# Patient Record
Sex: Female | Born: 1960 | Race: White | Hispanic: No | Marital: Married | Smoking: Current every day smoker
Health system: Southern US, Community
[De-identification: ages and names within clinical notes are randomized; demographics above are authoritative.]

## PROBLEM LIST (undated history)

## (undated) DIAGNOSIS — M1990 Unspecified osteoarthritis, unspecified site: Secondary | ICD-10-CM

## (undated) DIAGNOSIS — J45909 Unspecified asthma, uncomplicated: Secondary | ICD-10-CM

## (undated) DIAGNOSIS — J40 Bronchitis, not specified as acute or chronic: Secondary | ICD-10-CM

## (undated) DIAGNOSIS — K219 Gastro-esophageal reflux disease without esophagitis: Secondary | ICD-10-CM

## (undated) DIAGNOSIS — T7840XA Allergy, unspecified, initial encounter: Secondary | ICD-10-CM

## (undated) DIAGNOSIS — R51 Headache: Secondary | ICD-10-CM

## (undated) DIAGNOSIS — J189 Pneumonia, unspecified organism: Secondary | ICD-10-CM

## (undated) DIAGNOSIS — G8929 Other chronic pain: Secondary | ICD-10-CM

## (undated) DIAGNOSIS — F419 Anxiety disorder, unspecified: Secondary | ICD-10-CM

## (undated) DIAGNOSIS — M542 Cervicalgia: Secondary | ICD-10-CM

## (undated) DIAGNOSIS — I10 Essential (primary) hypertension: Secondary | ICD-10-CM

## (undated) HISTORY — PX: SPINE SURGERY: SHX786

## (undated) HISTORY — DX: Unspecified asthma, uncomplicated: J45.909

## (undated) HISTORY — PX: TUBAL LIGATION: SHX77

## (undated) HISTORY — PX: ABDOMINAL HYSTERECTOMY: SHX81

## (undated) HISTORY — PX: TOTAL ABDOMINAL HYSTERECTOMY: SHX209

## (undated) HISTORY — PX: DIAGNOSTIC LAPAROSCOPY: SUR761

## (undated) HISTORY — DX: Allergy, unspecified, initial encounter: T78.40XA

## (undated) HISTORY — PX: CARDIOVASCULAR STRESS TEST: SHX262

---

## 2005-08-28 ENCOUNTER — Ambulatory Visit: Payer: Self-pay | Admitting: General Practice

## 2006-07-16 ENCOUNTER — Observation Stay (HOSPITAL_COMMUNITY): Admission: EM | Admit: 2006-07-16 | Discharge: 2006-07-17 | Payer: Self-pay | Admitting: Emergency Medicine

## 2006-07-17 ENCOUNTER — Ambulatory Visit: Payer: Self-pay | Admitting: *Deleted

## 2008-02-11 ENCOUNTER — Other Ambulatory Visit: Admission: RE | Admit: 2008-02-11 | Discharge: 2008-02-11 | Payer: Self-pay | Admitting: Family Medicine

## 2008-02-15 ENCOUNTER — Ambulatory Visit (HOSPITAL_COMMUNITY): Admission: RE | Admit: 2008-02-15 | Discharge: 2008-02-15 | Payer: Self-pay | Admitting: Family Medicine

## 2009-07-31 ENCOUNTER — Other Ambulatory Visit: Admission: RE | Admit: 2009-07-31 | Discharge: 2009-07-31 | Payer: Self-pay | Admitting: Family Medicine

## 2009-08-02 ENCOUNTER — Emergency Department (HOSPITAL_COMMUNITY): Admission: EM | Admit: 2009-08-02 | Discharge: 2009-08-02 | Payer: Self-pay | Admitting: Emergency Medicine

## 2010-02-06 ENCOUNTER — Ambulatory Visit (HOSPITAL_COMMUNITY): Admission: RE | Admit: 2010-02-06 | Discharge: 2010-02-06 | Payer: Self-pay | Admitting: Family Medicine

## 2010-09-27 ENCOUNTER — Other Ambulatory Visit: Admission: RE | Admit: 2010-09-27 | Discharge: 2010-09-27 | Payer: Self-pay | Admitting: Family Medicine

## 2011-05-16 NOTE — H&P (Signed)
NAMEJAYNIE, HITCH           ACCOUNT NO.:  [id number]   MEDICAL RECORD NO.:  [id number]          PATIENT TYPE:  INP   LOCATION:  A219                          FACILITY:  [facility]   PHYSICIAN:  Osvaldo Shipper, MD     DATE OF BIRTH:  Mar 25, 1961   DATE OF ADMISSION:  07/16/2006  DATE OF DISCHARGE:  LH                                HISTORY & PHYSICAL   The patient does not have a PMD.   ADMITTING DIAGNOSES:  1.  Retrosternal chest pain of unclear etiology.  2.  History of tobacco abuse.   CHIEF COMPLAINT:  Chest pain.   HISTORY OF PRESENT ILLNESS:  The patient is a 50 year old Caucasian female  who presented to the ED today with complaints of retrosternal chest pain.  She states she has been having pain over the past one week, but most of the  time it was lasting just a few seconds.  She would describe it as a twinge  that would go away spontaneously.  This was mostly when she was working.  No  relation to activity level.  No relation to food.  This morning she woke up  at about 3:30 a.m. with retrosternal chest pain which was about 3-4/10 in  intensity.  Described as an achy pain.  She did not have any shortness of  breath.  No palpitations.  She had one episode of diaphoresis.  She said  sometimes the pain seems to radiate to the back, and sometimes to the left  shoulder.  She has never had these symptoms in the past.  She does have a  history of acid reflux disease.  Currently she is on no medications.  She  has lost about 12 pounds over the past three months, but she has been trying  to lose weight.   Of note, patient mentions that she does do exercise on a regular basis, and  she does fast walking on the track, and she never experiences any chest pain  with that.  She did feel some nausea, but no emesis.  She reports driving 5  hours last Thursday, which was work related.  No history of any cough or  fever at home.   MEDICATIONS AT HOME:  She does not take any scheduled  medications at home.  She took Sudafed last week for allergies.   ALLERGIES:  1.  PENICILLIN.  2.  CODEINE.  3.  NEOSPORIN.   PAST MEDICAL HISTORY:  1.  She mentioned that she required dilation of her esophagus about two      years ago, which was related possibly because of stricture related to      her acid reflux disease.  She mentioned she was having dysphagia at that      time.  She does not report any dysphagia currently.  2.  History of tubal ligation.  3.  Hysterectomy for menorrhagia.  4.  History of exploratory laparotomy for abdominal pain in the past.  5.  No history of hypertension.  No history of diabetes, strokes or heart      disease.   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   Smoking history includes about 35 pack-year history of smoking.  Alcohol use  includes one glass of red wine every day for the past two months.   No illicit drug use.  Independent with her ADLs.   FAMILY HISTORY:  She mentioned that there is a history of some hereditary  heart disease in her father's side, as a result of which her father required  transplant at the age of 67.  History of CHF, coronary artery disease,  stroke and hypertension in her father.  Mother had hypertension,  dyslipidemia.  She has uncles and aunts on her mother's side who have had  coronary artery disease.  She has three brothers who have hypertension.  Sisters have hypothyroidism and hyperthyroidism, and one of her sisters also  has lupus.   REVIEW OF SYSTEMS:  Unremarkable except as mentioned in the HPI.   PHYSICAL EXAMINATION:  VITAL SIGNS:  Temperature 97.7 in the ED, blood  pressure 144/90 in the ED, heart rate initially 90, subsequently about 70,  respiratory rate 16, saturation 99% on room air.  GENERAL EXAM:  Thin white female in no distress, slightly anxious.  HEENT:  There is no pallor and no  icterus.  Oral mucosa is moist.  No oral  lesions are noted.  LUNGS:  Clear to auscultation bilaterally.  There were a few crackles at the  right base which cleared with coughing.  CARDIOVASCULAR:  S1-S2 normal, regular, no murmurs appreciated, no S3-S4, no  rubs, no JVD seen.  ABDOMEN:  Soft, nontender and nondistended.  Bowel sounds present.  No mass  or organomegaly appreciated.  There was no reproduction of chest pain with palpation.  EXTREMITIES:  Without edema.  All peripheral pulses are palpable.  No calf  tenderness was present.   LABORATORY DATA:  Her CBC, her CMP, coag profile, D-dimer, cardiac markers  times three were all unremarkable.   Chest x-ray is unremarkable as well with no mediastinal abnormalities.   She did have an EKG which shows a sinus rhythm with a normal axis.  Intervals appear to be within the normal range.  No Q-waves are appreciated  on this EKG.  No concerning ST or T-wave changes are noted as well.   IMPRESSION:  This is a 50 year old Caucasian with past medical history of  acid reflux disease, requiring esophageal dilatation about two years ago,  and really no other medical history, who presents with retrosternal chest  pain.  Differential diagnoses at this time include acute coronary syndrome,  which is less likely considering she only has one risk factor, in the form  of smoking use.  She may have a family history, but that is not clear at  this time.  Other differentials include acid reflux disease causing these  symptoms, which is a good likelihood considering her previous history.  The  patient could have esophageal dysmotility as well.  Pulmonary embolism has  pretty much been ruled out with a normal D-dimer.  Other differentials  include chest wall pain and aortic dissection, which are  both less likely  in this individual at this time.   PLAN:  1.  Chest pain.  The patient is on telemetry.  Continue to monitor her     repeat EKGs to rule  out coronary syndrome by serial cardiac enzymes.  I      will  check a lipid profile in the morning.  She has been put on PPI      already.  I will check blood pressure in both arms.  Echocardiogram will      be obtained in the morning.   will be consulted in the morning.      If she does rule out for cardiac etiology, gastroenterology may need to      be involved, possibly most likely as an outpatient.  2.  Tobacco abuse.  She has been given nicotine patch.  3.  TSH will be checked.  DVT and GI prophylaxis will be given.  4.  We will also give her one dose of nitroglycerin to see how her pain      responds.   Further management decisions will be based on the results of the initial  testing and the patient's response to treatment.      Osvaldo Shipper, MD  Electronically Signed     GK/MEDQ  D:  07/16/2006  T:  07/16/2006  Job:  [id number]

## 2011-05-16 NOTE — Procedures (Signed)
NAMECHRIS, NARASIMHAN NO.:  [id number]   MEDICAL RECORD NO.:  [id number]          PATIENT TYPE:  INP   LOCATION:  A219                          FACILITY:  [facility]   PHYSICIAN:  Vida Roller, M.D.   DATE OF BIRTH:  [birth year]   DATE OF PROCEDURE:  07/17/2006  DATE OF DISCHARGE:                                  ECHOCARDIOGRAM   PRIMARY:  Unknown.   REFERRING PHYSICIAN:  The hospitalist group at [facility].   HISTORY OF PRESENT ILLNESS:  This is a 50 year old woman with atypical chest  discomfort ruled out for myocardial infarction as an inpatient at [facility].  This is a definitive procedure.   DETAILS OF THE PROCEDURE:  Patient was brought to the echocardiographic  laboratory where she was placed in the left lateral decubitus position.  Echocardiographic images were obtained in the apical four, apical two,  parasternal long, peripheral short axis view.  She did exercise on a Bruce  protocol treadmill when she had reached at least 85% of her max predicted  heart rate for age.  She was immediately moved back to the echocardiographic  table where echocardiographic images were obtained in the same views.  These  were used to compare with the rest of views to obtain an evaluation for wall  motion abnormality.  At the conclusion of the procedure, she was recovered  as appropriate for a Bruce treadmill stress test.   RESULTS:  Patient exercised 9 minutes of a Bruce protocol attaining 10 METS  of exercise.  Her heart rate went from 104 beats a minute to 183 beats a  minute which is 103% of her max predicted heart rate for her age.  Her blood  pressure went from 110/60 to 142/78 giving a double product of 25.2  thousand.  During that time she had no chest discomfort, no shortness of  breath, and actually tolerated the exercise very well.  Her  electrocardiographic images reveal no ST-segment depression.  There is an  occasional PVC, but no nonsustained ventricular  tachycardia.  There is one  couplet.  Her heart rate recovery is excellent.   ECHOCARDIOGRAPHIC IMAGES:  Rest images reveal normal LV systolic function  with an ejection fraction of 50-55% with no wall motion abnormalities.   Stress images reveal appropriate augmentation of systolic function, no  significant wall motion abnormalities.  The quality of the studies both at  rest and exercise are good.   ASSESSMENT:  1.  No evidence of obstructive coronary disease.  2.  Excellent exercise tolerance.  3.  Maximum negative treadmill stress test.      Vida Roller, M.D.  Electronically Signed     JH/MEDQ  D:  07/17/2006  T:  07/17/2006  Job:  [id number]

## 2011-05-16 NOTE — Discharge Summary (Signed)
NAMEALYSE, KATHAN           ACCOUNT NO.:  [id number]   MEDICAL RECORD NO.:  [id number]          PATIENT TYPE:  INP   LOCATION:  A219                          FACILITY:  [facility]   PHYSICIAN:  Hanley Hays. Dechurch, M.D.DATE OF BIRTH:  29-Oct-1961   DATE OF ADMISSION:  07/16/2006  DATE OF DISCHARGE:  07/20/2007LH                                 DISCHARGE SUMMARY   DIAGNOSES:  1.  Atypical chest pain.  2.  Gastroesophageal reflux disease.  3.  Tobacco abuse.   DISPOSITION:  The patient is discharged to home.  Follow up:  The patient  provided a list of physicians taking new patients in this area.   DISCHARGE MEDICATIONS:  Protonix 40 mg daily.   SUMMARY:  Smoking cessation counseling was discussed with the patient.  Chantex control will need to be deferred to her primary care physician.   HOSPITAL COURSE:  A 50 year old Caucasian female in good health presented  with intermittent waxing and waning chest pain that awakened her from sleep.  She was seen in the emergency room where her initial studies were  unremarkable.  Her risk factors include tobacco abuse and there was some  family history of father having a cardiac transplant related to some sort of  congenital anomaly.  Lipid status is unknown but profile was pending at the  time of discharge.  In any event, she was seen in consultation by cardiology  who performed stress echo and this revealed no evidence of ischemic disease  and an essentially normal echo.  She is being discharged to home with  Protonix given her longstanding history of gastroesophageal reflux disease  actually requiring dilatation at one point.  She expressed interest in  Chantex but due to the need for followup and comprehensive smoking cessation  program this will be deferred to a primary care physician which she was  heartily encouraged to attain.  She is being discharged to home in stable  condition.      Hanley Hays Josefine Class, M.D.  Electronically  Signed     FED/MEDQ  D:  07/17/2006  T:  07/17/2006  Job:  [id number]

## 2011-05-16 NOTE — Consult Note (Signed)
NAMEREIS, PIENTA NO.:  [id number]   MEDICAL RECORD NO.:  [id number]          PATIENT TYPE:  INP   LOCATION:  A219                          FACILITY:  [facility]   PHYSICIAN:  Vida Roller, M.D.   DATE OF BIRTH:  20-Jun-1961   DATE OF CONSULTATION:  07/17/2006  DATE OF DISCHARGE:                                   CONSULTATION   PRIMARY CARE PHYSICIAN:  Unknown.   REFERRING PHYSICIAN:  Hospitalist group at [facility].   HISTORY OF PRESENT ILLNESS:  Ms. Kawashima is a 50 year old woman with  limited past medical history only for gastroesophageal reflux disease status  post esophageal dilatation about two years ago.  She presents with  discomfort in her chest which has been going on for about two days.  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  She is  now without discomfort in her chest.  She feels actually very well.  She  denies any PND or orthopnea.  No diaphoresis.  No radiation to the  discomfort.  No nausea or vomiting.  She came into the emergency department  and was evaluated and admitted.  She ruled out for myocardial infarction,  and we were asked to evaluate for further therapy.   PAST MEDICAL HISTORY:  1.  Tobacco abuse.  She smokes about a pack and a half per day and has for      about 15 to 20 years.  2.  History of bilateral tubal ligation and then a hysterectomy with an      exploratory laparotomy all for pelvic inflammatory disease, I believe.      She does have her ovaries still.  She does not have active      postmenopausal symptoms.   MEDICATIONS PRIOR TO ADMISSION:  1.  Sudafed on a p.r.n. basis.  2.  She  is currently on DVT prophylaxis with Lovenox.  3.  Nicotine patch.  4.  Protonix 40 mg once a day.   SOCIAL HISTORY:  She lives in [place] with her husband.  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  She  smokes, as previously described.  She drinks about a glass of red wine a  day.  She does not use any illicit drugs.  She does exercise relatively  frequently and has no discomfort from that.   FAMILY HISTORY:  Her mother and father are both still alive.  Her father had  a heart transplant a number of years ago at age 74.  He does have coronary  disease and had a stroke.  Her brother has hypertension.  She has a sister  with hypothyroidism.  Her mother has hypertension and hyperlipidemia.   REVIEW OF SYSTEMS:  Negative.   PHYSICAL  EXAMINATION:  GENERAL:  She is a well-developed, thin, white female  in no apparent distress who is alert and oriented x4.  VITAL SIGNS:  Pulse 74, respirations 20, blood pressure 100/63.  HEENT:  Unremarkable.  NECK:  Supple.  There is no jugular venous distension or carotid bruits.  CHEST:  Clear, except for some rhonchi in her left base which cleared with a  cough.  CARDIOVASCULAR:  Regular.  Her point of maximal impulse is not displaced.  She has no lifts or thrills.  First and second heart sounds are normal.  SKIN:  Her skin has just multiple tattoos but is without any other lesions.  BREASTS:  Deferred.  GU:  Deferred.  RECTAL:  Deferred.  ABDOMEN:  Soft, nontender.  Normoactive bowel sounds.  EXTREMITIES:  Lower extremities are without clubbing, cyanosis, or edema.  Pulses are 2+ throughout.  MUSCULOSKELETAL:  Nonfocal.  NEUROLOGIC:  Nonfocal.   DIAGNOSTIC STUDIES:  Chest x-ray is normal.  Electrocardiogram shows sinus  rhythm at a rate of 74 with normal intervals, normal axis.  No ischemic ST-T  wave changes and no Q waves.  I have no old EKGs for comparison.   LABORATORY DATA:  CBC:  White count  8.5, H&H 14 and 42, platelet count 392.  Sodium 138, potassium 3.5, chloride 106, bicarb 25, BUN 7, creatinine 0.8,  blood sugar 108.  LFTs are normal.  D-dimer is normal.  TSH is normal.  Two  sets of cardiac enzymes are not consistent with acute myocardial infarction.  Coagulation studies are all normal.   IMPRESSION:  This is a lady with chest pain which is atypical for coronary  disease.  She has a normal electrocardiogram, normal enzymes.  Pain is now  resolved.  She does have two risk factors for coronary disease.  Number two  is her tobacco abuse.  She does need cessation.   PLAN:  My plan is to do an echocardiogram and a stress echocardiogram.  If  these look fine, I think she can probably go home and be evaluated for what  is likely GI or musculoskeletal problems.  I would consider maybe adding  Chantix to her smoking cessation program.      Vida Roller, M.D.  Electronically Signed     JH/MEDQ  D:  07/17/2006  T:  07/17/2006  Job:  [id number]

## 2011-05-16 NOTE — Procedures (Signed)
Toni Mcdonald, VIOLETTE NO.:  [id number]   MEDICAL RECORD NO.:  [id number]          PATIENT TYPE:  INP   LOCATION:  A219                          FACILITY:  [facility]   PHYSICIAN:  Vida Roller, M.D.   DATE OF BIRTH:  February 01, 1961   DATE OF PROCEDURE:  07/17/2006  DATE OF DISCHARGE:                                  ECHOCARDIOGRAM   REFERRING PHYSICIAN:  Dr. Othelia Pulling NUMBER:  [id number]   TAPE COUNT:  [id number]   This is a 50 year old woman with no past medical history for chest  discomfort.  Technical quality of the study is adequate.   M-MODE TRACINGS:  The aortic is 26 mm.   Left atrium is 32 mm.   The septum is 8 mm.   The posterior wall is 8 mm.   Left ventricular diastolic dimension 42 mm.   Left ventricular systolic dimension 32 mm.   2-D AND DOPPLER IMAGING:  The left ventricle is normal size.  There is a low-  normal ejection fraction at 50-55%.  There is no wall motion abnormality  seen.   The right ventricle is normal size with normal systolic function.   Both atria are normal size.   There is no obvious valvular heart disease.   There is no pericardial effusion.      Vida Roller, M.D.  Electronically Signed     JH/MEDQ  D:  07/17/2006  T:  07/17/2006  Job:  [id number]   cc:   Hanley Hays. Josefine Class, M.D.  Fax: [phone]

## 2011-10-07 ENCOUNTER — Other Ambulatory Visit (HOSPITAL_COMMUNITY): Payer: Self-pay | Admitting: Family Medicine

## 2011-10-07 DIAGNOSIS — Z139 Encounter for screening, unspecified: Secondary | ICD-10-CM

## 2011-10-09 ENCOUNTER — Ambulatory Visit (HOSPITAL_COMMUNITY)
Admission: RE | Admit: 2011-10-09 | Discharge: 2011-10-09 | Disposition: A | Discharge status: Home / Self Care | Payer: PRIVATE HEALTH INSURANCE | Source: Ambulatory Visit | Attending: Family Medicine | Admitting: Family Medicine

## 2011-10-09 DIAGNOSIS — Z1231 Encounter for screening mammogram for malignant neoplasm of breast: Secondary | ICD-10-CM | POA: Insufficient documentation

## 2011-10-09 DIAGNOSIS — Z139 Encounter for screening, unspecified: Secondary | ICD-10-CM

## 2012-08-02 ENCOUNTER — Other Ambulatory Visit: Payer: Self-pay | Admitting: Neurological Surgery

## 2012-09-13 ENCOUNTER — Encounter (HOSPITAL_COMMUNITY): Payer: Self-pay | Admitting: Pharmacy Technician

## 2012-09-15 ENCOUNTER — Encounter (HOSPITAL_COMMUNITY)
Admission: RE | Admit: 2012-09-15 | Discharge: 2012-09-15 | Disposition: A | Discharge status: Home / Self Care | Payer: 59 | Source: Ambulatory Visit | Attending: Neurological Surgery | Admitting: Neurological Surgery

## 2012-09-15 ENCOUNTER — Encounter (HOSPITAL_COMMUNITY): Payer: Self-pay

## 2012-09-15 HISTORY — DX: Gastro-esophageal reflux disease without esophagitis: K21.9

## 2012-09-15 HISTORY — DX: Anxiety disorder, unspecified: F41.9

## 2012-09-15 HISTORY — DX: Cervicalgia: M54.2

## 2012-09-15 HISTORY — DX: Headache: R51

## 2012-09-15 HISTORY — DX: Pneumonia, unspecified organism: J18.9

## 2012-09-15 HISTORY — DX: Essential (primary) hypertension: I10

## 2012-09-15 HISTORY — DX: Unspecified osteoarthritis, unspecified site: M19.90

## 2012-09-15 HISTORY — DX: Bronchitis, not specified as acute or chronic: J40

## 2012-09-15 HISTORY — DX: Other chronic pain: G89.29

## 2012-09-15 LAB — BASIC METABOLIC PANEL
BUN: 7 mg/dL (ref 6–23)
CO2: 29 mEq/L (ref 19–32)
Chloride: 106 mEq/L (ref 96–112)
Creatinine, Ser: 0.74 mg/dL (ref 0.50–1.10)
Glucose, Bld: 105 mg/dL — ABNORMAL HIGH (ref 70–99)

## 2012-09-15 LAB — CBC WITH DIFFERENTIAL/PLATELET
Basophils Absolute: 0.1 10*3/uL (ref 0.0–0.1)
Basophils Relative: 1 % (ref 0–1)
Eosinophils Relative: 2 % (ref 0–5)
HCT: 46.4 % — ABNORMAL HIGH (ref 36.0–46.0)
MCH: 30.1 pg (ref 26.0–34.0)
MCHC: 33 g/dL (ref 30.0–36.0)
MCV: 91.2 fL (ref 78.0–100.0)
Monocytes Absolute: 1 10*3/uL (ref 0.1–1.0)
RDW: 12.9 % (ref 11.5–15.5)

## 2012-09-15 LAB — SURGICAL PCR SCREEN: Staphylococcus aureus: NEGATIVE

## 2012-09-15 NOTE — Pre-Procedure Instructions (Signed)
[address]  09/15/2012   Your procedure is scheduled on:  Wednesday September 22, 2012  Report to [facility] Stay [facility] at 6:30 AM.  Call this number if you have problems the morning of surgery: [phone]   Remember:   Do not eat food or drinkAfter Midnight.      Take these medicines the morning of surgery with A SIP OF WATER: xanax, nicoderm   Do not wear jewelry, make-up or nail polish.  Do not wear lotions, powders, or perfumes. You may wear deodorant.  Do not shave 48 hours prior to surgery. Men may shave face and neck.  Do not bring valuables to the hospital.  Contacts, dentures or bridgework may not be worn into surgery.  Leave suitcase in the car. After surgery it may be brought to your room.  For patients admitted to the hospital, checkout time is 11:00 AM the day of discharge.   Patients discharged the day of surgery will not be allowed to drive home.  Name and phone number of your driver: family / friend  Special Instructions: CHG Shower Use Special Wash: 1/2 bottle night before surgery and 1/2 bottle morning of surgery.   Please read over the following fact sheets that you were given: Pain Booklet, Coughing and Deep Breathing, MRSA Information and Surgical Site Infection Prevention

## 2012-09-21 MED ORDER — DEXAMETHASONE SODIUM PHOSPHATE 10 MG/ML IJ SOLN
10.0000 mg | INTRAMUSCULAR | Status: AC
  Administered 2012-09-22: 10 mg via INTRAVENOUS
  Filled 2012-09-21: qty 1

## 2012-09-21 MED ORDER — VANCOMYCIN HCL 1000 MG IV SOLR
1500.0000 mg | INTRAVENOUS | Status: AC
  Administered 2012-09-22: 1500 mg via INTRAVENOUS
  Filled 2012-09-21: qty 1500

## 2012-09-22 ENCOUNTER — Encounter (HOSPITAL_COMMUNITY): Payer: Self-pay | Admitting: Anesthesiology

## 2012-09-22 ENCOUNTER — Ambulatory Visit (HOSPITAL_COMMUNITY)
Admission: RE | Admit: 2012-09-22 | Discharge: 2012-09-23 | Disposition: A | Discharge status: Home / Self Care | Payer: 59 | Source: Ambulatory Visit | Attending: Neurological Surgery | Admitting: Neurological Surgery

## 2012-09-22 ENCOUNTER — Encounter (HOSPITAL_COMMUNITY): Payer: Self-pay | Admitting: *Deleted

## 2012-09-22 ENCOUNTER — Ambulatory Visit (HOSPITAL_COMMUNITY): Payer: 59 | Admitting: Anesthesiology

## 2012-09-22 ENCOUNTER — Ambulatory Visit (HOSPITAL_COMMUNITY): Payer: 59

## 2012-09-22 ENCOUNTER — Encounter (HOSPITAL_COMMUNITY)
Admission: RE | Disposition: A | Discharge status: Home / Self Care | Payer: Self-pay | Source: Ambulatory Visit | Attending: Neurological Surgery

## 2012-09-22 DIAGNOSIS — F17200 Nicotine dependence, unspecified, uncomplicated: Secondary | ICD-10-CM | POA: Insufficient documentation

## 2012-09-22 DIAGNOSIS — Z01812 Encounter for preprocedural laboratory examination: Secondary | ICD-10-CM | POA: Insufficient documentation

## 2012-09-22 DIAGNOSIS — Z01818 Encounter for other preprocedural examination: Secondary | ICD-10-CM | POA: Insufficient documentation

## 2012-09-22 DIAGNOSIS — I10 Essential (primary) hypertension: Secondary | ICD-10-CM | POA: Insufficient documentation

## 2012-09-22 DIAGNOSIS — Z981 Arthrodesis status: Secondary | ICD-10-CM

## 2012-09-22 DIAGNOSIS — M47812 Spondylosis without myelopathy or radiculopathy, cervical region: Secondary | ICD-10-CM | POA: Insufficient documentation

## 2012-09-22 DIAGNOSIS — Z01810 Encounter for preprocedural cardiovascular examination: Secondary | ICD-10-CM | POA: Insufficient documentation

## 2012-09-22 HISTORY — PX: ANTERIOR CERVICAL DECOMP/DISCECTOMY FUSION: SHX1161

## 2012-09-22 SURGERY — ANTERIOR CERVICAL DECOMPRESSION/DISCECTOMY FUSION 3 LEVELS
Anesthesia: General | Site: Neck | Wound class: Clean

## 2012-09-22 MED ORDER — CYCLOBENZAPRINE HCL 10 MG PO TABS
10.0000 mg | ORAL_TABLET | Freq: Three times a day (TID) | ORAL | Status: DC | PRN
  Administered 2012-09-22 – 2012-09-23 (×2): 10 mg via ORAL
  Filled 2012-09-22 (×2): qty 1

## 2012-09-22 MED ORDER — SODIUM CHLORIDE 0.9 % IV SOLN: 250.0000 mL | INTRAVENOUS | Status: DC

## 2012-09-22 MED ORDER — ONDANSETRON HCL 4 MG/2ML IJ SOLN
INTRAMUSCULAR | Status: DC | PRN
  Administered 2012-09-22: 4 mg via INTRAVENOUS

## 2012-09-22 MED ORDER — ACETAMINOPHEN 325 MG PO TABS: 650.0000 mg | ORAL_TABLET | ORAL | Status: DC | PRN

## 2012-09-22 MED ORDER — DEXAMETHASONE SODIUM PHOSPHATE 4 MG/ML IJ SOLN
4.0000 mg | Freq: Four times a day (QID) | INTRAMUSCULAR | Status: DC
  Administered 2012-09-23: 4 mg via INTRAVENOUS
  Filled 2012-09-22 (×5): qty 1

## 2012-09-22 MED ORDER — ROCURONIUM BROMIDE 100 MG/10ML IV SOLN
INTRAVENOUS | Status: DC | PRN
  Administered 2012-09-22: 50 mg via INTRAVENOUS

## 2012-09-22 MED ORDER — HYDROMORPHONE HCL 2 MG PO TABS
2.0000 mg | ORAL_TABLET | Freq: Four times a day (QID) | ORAL | Status: DC | PRN
  Administered 2012-09-22 – 2012-09-23 (×2): 2 mg via ORAL
  Filled 2012-09-22 (×2): qty 1

## 2012-09-22 MED ORDER — ACETAMINOPHEN 10 MG/ML IV SOLN
1000.0000 mg | Freq: Four times a day (QID) | INTRAVENOUS | Status: AC
  Administered 2012-09-22 – 2012-09-23 (×4): 1000 mg via INTRAVENOUS
  Filled 2012-09-22 (×5): qty 100

## 2012-09-22 MED ORDER — 0.9 % SODIUM CHLORIDE (POUR BTL) OPTIME
TOPICAL | Status: DC | PRN
  Administered 2012-09-22: 1000 mL

## 2012-09-22 MED ORDER — ONDANSETRON HCL 4 MG/2ML IJ SOLN: 4.0000 mg | INTRAMUSCULAR | Status: DC | PRN

## 2012-09-22 MED ORDER — VECURONIUM BROMIDE 10 MG IV SOLR
INTRAVENOUS | Status: DC | PRN
  Administered 2012-09-22: 3 mg via INTRAVENOUS
  Administered 2012-09-22: 2 mg via INTRAVENOUS

## 2012-09-22 MED ORDER — SODIUM CHLORIDE 0.9 % IR SOLN
Status: DC | PRN
  Administered 2012-09-22: 09:00:00

## 2012-09-22 MED ORDER — THROMBIN 5000 UNITS EX SOLR
OROMUCOSAL | Status: DC | PRN
  Administered 2012-09-22: 09:00:00 via TOPICAL

## 2012-09-22 MED ORDER — PHENYLEPHRINE HCL 10 MG/ML IJ SOLN
INTRAMUSCULAR | Status: DC | PRN
  Administered 2012-09-22 (×3): 80 ug via INTRAVENOUS

## 2012-09-22 MED ORDER — PNEUMOCOCCAL VAC POLYVALENT 25 MCG/0.5ML IJ INJ
0.5000 mL | INJECTION | INTRAMUSCULAR | Status: DC
  Filled 2012-09-22: qty 0.5

## 2012-09-22 MED ORDER — LACTATED RINGERS IV SOLN
INTRAVENOUS | Status: DC | PRN
  Administered 2012-09-22 (×2): via INTRAVENOUS

## 2012-09-22 MED ORDER — HYDROMORPHONE HCL PF 1 MG/ML IJ SOLN: 0.5000 mg | INTRAMUSCULAR | Status: DC | PRN

## 2012-09-22 MED ORDER — LIDOCAINE HCL (CARDIAC) 20 MG/ML IV SOLN
INTRAVENOUS | Status: DC | PRN
  Administered 2012-09-22: 60 mg via INTRAVENOUS

## 2012-09-22 MED ORDER — ACETAMINOPHEN 10 MG/ML IV SOLN
INTRAVENOUS | Status: AC
  Administered 2012-09-22: 1000 mg via INTRAVENOUS
  Filled 2012-09-22: qty 100

## 2012-09-22 MED ORDER — SENNA 8.6 MG PO TABS
1.0000 | ORAL_TABLET | Freq: Two times a day (BID) | ORAL | Status: DC
  Administered 2012-09-22 – 2012-09-23 (×2): 8.6 mg via ORAL
  Filled 2012-09-22 (×4): qty 1

## 2012-09-22 MED ORDER — NEOSTIGMINE METHYLSULFATE 1 MG/ML IJ SOLN
INTRAMUSCULAR | Status: DC | PRN
  Administered 2012-09-22: 3 mg via INTRAVENOUS

## 2012-09-22 MED ORDER — SODIUM CHLORIDE 0.9 % IJ SOLN
3.0000 mL | Freq: Two times a day (BID) | INTRAMUSCULAR | Status: DC
  Administered 2012-09-22 – 2012-09-23 (×2): 3 mL via INTRAVENOUS

## 2012-09-22 MED ORDER — SODIUM CHLORIDE 0.9 % IV SOLN
INTRAVENOUS | Status: AC
  Filled 2012-09-22: qty 500

## 2012-09-22 MED ORDER — GLYCOPYRROLATE 0.2 MG/ML IJ SOLN
INTRAMUSCULAR | Status: DC | PRN
  Administered 2012-09-22: 0.4 mg via INTRAVENOUS

## 2012-09-22 MED ORDER — ALPRAZOLAM 0.25 MG PO TABS: 0.2500 mg | ORAL_TABLET | Freq: Every evening | ORAL | Status: DC | PRN

## 2012-09-22 MED ORDER — VANCOMYCIN HCL 500 MG IV SOLR
500.0000 mg | Freq: Once | INTRAVENOUS | Status: AC
  Administered 2012-09-22: 500 mg via INTRAVENOUS
  Filled 2012-09-22: qty 500

## 2012-09-22 MED ORDER — PROPOFOL 10 MG/ML IV BOLUS
INTRAVENOUS | Status: DC | PRN
  Administered 2012-09-22: 140 mg via INTRAVENOUS

## 2012-09-22 MED ORDER — DEXAMETHASONE 4 MG PO TABS
4.0000 mg | ORAL_TABLET | Freq: Four times a day (QID) | ORAL | Status: DC
  Administered 2012-09-22 – 2012-09-23 (×4): 4 mg via ORAL
  Filled 2012-09-22 (×7): qty 1

## 2012-09-22 MED ORDER — FENTANYL CITRATE 0.05 MG/ML IJ SOLN
INTRAMUSCULAR | Status: DC | PRN
  Administered 2012-09-22 (×3): 50 ug via INTRAVENOUS
  Administered 2012-09-22: 100 ug via INTRAVENOUS

## 2012-09-22 MED ORDER — MENTHOL 3 MG MT LOZG: 1.0000 | LOZENGE | OROMUCOSAL | Status: DC | PRN

## 2012-09-22 MED ORDER — ACETAMINOPHEN 650 MG RE SUPP: 650.0000 mg | RECTAL | Status: DC | PRN

## 2012-09-22 MED ORDER — INFLUENZA VIRUS VACC SPLIT PF IM SUSP
0.5000 mL | INTRAMUSCULAR | Status: DC
  Filled 2012-09-22: qty 0.5

## 2012-09-22 MED ORDER — NICOTINE 14 MG/24HR TD PT24
14.0000 mg | MEDICATED_PATCH | TRANSDERMAL | Status: DC
  Administered 2012-09-22: 14 mg via TRANSDERMAL
  Filled 2012-09-22 (×2): qty 1

## 2012-09-22 MED ORDER — BUPIVACAINE HCL (PF) 0.25 % IJ SOLN
INTRAMUSCULAR | Status: DC | PRN
  Administered 2012-09-22: 5 mL

## 2012-09-22 MED ORDER — SURGIFOAM 100 EX MISC
CUTANEOUS | Status: DC | PRN
  Administered 2012-09-22: 09:00:00 via TOPICAL

## 2012-09-22 MED ORDER — PHENOL 1.4 % MT LIQD: 1.0000 | OROMUCOSAL | Status: DC | PRN

## 2012-09-22 MED ORDER — CEFAZOLIN SODIUM 1-5 GM-% IV SOLN: 1.0000 g | Freq: Three times a day (TID) | INTRAVENOUS | Status: DC

## 2012-09-22 MED ORDER — BACITRACIN 50000 UNITS IM SOLR
INTRAMUSCULAR | Status: AC
  Filled 2012-09-22: qty 1

## 2012-09-22 MED ORDER — MIDAZOLAM HCL 5 MG/5ML IJ SOLN
INTRAMUSCULAR | Status: DC | PRN
  Administered 2012-09-22: 2 mg via INTRAVENOUS

## 2012-09-22 MED ORDER — POTASSIUM CHLORIDE IN NACL 20-0.9 MEQ/L-% IV SOLN
INTRAVENOUS | Status: DC
  Filled 2012-09-22 (×4): qty 1000

## 2012-09-22 MED ORDER — SODIUM CHLORIDE 0.9 % IV SOLN
INTRAVENOUS | Status: DC | PRN
  Administered 2012-09-22: 08:00:00 via INTRAVENOUS

## 2012-09-22 MED ORDER — SODIUM CHLORIDE 0.9 % IJ SOLN: 3.0000 mL | INTRAMUSCULAR | Status: DC | PRN

## 2012-09-22 SURGICAL SUPPLY — 51 items
ALLOGRAFT LORDOTIC CC 7X11X14 (Bone Implant) ×6 IMPLANT
BAG DECANTER FOR FLEXI CONT (MISCELLANEOUS) ×2 IMPLANT
BENZOIN TINCTURE PRP APPL 2/3 (GAUZE/BANDAGES/DRESSINGS) ×2 IMPLANT
BUR MATCHSTICK NEURO 3.0 LAGG (BURR) ×2 IMPLANT
CANISTER SUCTION 2500CC (MISCELLANEOUS) ×2 IMPLANT
CLOTH BEACON ORANGE TIMEOUT ST (SAFETY) ×2 IMPLANT
CONT SPEC 4OZ CLIKSEAL STRL BL (MISCELLANEOUS) ×2 IMPLANT
DRAIN SNY WOU 7FLT (WOUND CARE) ×2 IMPLANT
DRAPE C-ARM 42X72 X-RAY (DRAPES) ×4 IMPLANT
DRAPE LAPAROTOMY 100X72 PEDS (DRAPES) ×2 IMPLANT
DRAPE MICROSCOPE ZEISS OPMI (DRAPES) ×2 IMPLANT
DRAPE POUCH INSTRU U-SHP 10X18 (DRAPES) ×2 IMPLANT
DRESSING TELFA 8X3 (GAUZE/BANDAGES/DRESSINGS) ×2 IMPLANT
DRILL BIT HELIX (BIT) ×2 IMPLANT
DRSG OPSITE 4X5.5 SM (GAUZE/BANDAGES/DRESSINGS) ×2 IMPLANT
DURAPREP 6ML APPLICATOR 50/CS (WOUND CARE) ×2 IMPLANT
ELECT COATED BLADE 2.86 ST (ELECTRODE) ×2 IMPLANT
ELECT REM PT RETURN 9FT ADLT (ELECTROSURGICAL) ×2
ELECTRODE REM PT RTRN 9FT ADLT (ELECTROSURGICAL) ×1 IMPLANT
EVACUATOR SILICONE 100CC (DRAIN) ×2 IMPLANT
GAUZE SPONGE 4X4 16PLY XRAY LF (GAUZE/BANDAGES/DRESSINGS) IMPLANT
GLOVE BIO SURGEON STRL SZ8 (GLOVE) ×4 IMPLANT
GLOVE BIO SURGEON STRL SZ8.5 (GLOVE) ×2 IMPLANT
GLOVE INDICATOR 8.5 STRL (GLOVE) ×2 IMPLANT
GLOVE SS BIOGEL STRL SZ 8 (GLOVE) ×1 IMPLANT
GLOVE SUPERSENSE BIOGEL SZ 8 (GLOVE) ×1
GLOVE SURG SS PI 8.0 STRL IVOR (GLOVE) ×4 IMPLANT
GOWN BRE IMP SLV AUR LG STRL (GOWN DISPOSABLE) IMPLANT
GOWN BRE IMP SLV AUR XL STRL (GOWN DISPOSABLE) ×4 IMPLANT
GOWN STRL REIN 2XL LVL4 (GOWN DISPOSABLE) ×2 IMPLANT
HEAD HALTER (SOFTGOODS) IMPLANT
HEMOSTAT POWDER KIT SURGIFOAM (HEMOSTASIS) ×2 IMPLANT
KIT BASIN OR (CUSTOM PROCEDURE TRAY) ×2 IMPLANT
KIT ROOM TURNOVER OR (KITS) ×2 IMPLANT
NEEDLE HYPO 25X1 1.5 SAFETY (NEEDLE) ×2 IMPLANT
NEEDLE SPNL 20GX3.5 QUINCKE YW (NEEDLE) ×2 IMPLANT
NS IRRIG 1000ML POUR BTL (IV SOLUTION) ×2 IMPLANT
PACK LAMINECTOMY NEURO (CUSTOM PROCEDURE TRAY) ×2 IMPLANT
PAD ARMBOARD 7.5X6 YLW CONV (MISCELLANEOUS) ×6 IMPLANT
PLATE HELIX T 54MM (Plate) ×2 IMPLANT
RUBBERBAND STERILE (MISCELLANEOUS) ×4 IMPLANT
SCREW FIXED SELF TAP 4.0X13MM (Screw) ×16 IMPLANT
SPONGE INTESTINAL PEANUT (DISPOSABLE) ×2 IMPLANT
SPONGE SURGIFOAM ABS GEL 100 (HEMOSTASIS) ×2 IMPLANT
STRIP CLOSURE SKIN 1/2X4 (GAUZE/BANDAGES/DRESSINGS) ×2 IMPLANT
SUT VIC AB 3-0 SH 8-18 (SUTURE) ×2 IMPLANT
SYR 20ML ECCENTRIC (SYRINGE) ×2 IMPLANT
TOWEL OR 17X24 6PK STRL BLUE (TOWEL DISPOSABLE) ×2 IMPLANT
TOWEL OR 17X26 10 PK STRL BLUE (TOWEL DISPOSABLE) ×2 IMPLANT
TRAP SPECIMEN MUCOUS 40CC (MISCELLANEOUS) ×2 IMPLANT
WATER STERILE IRR 1000ML POUR (IV SOLUTION) ×2 IMPLANT

## 2012-09-22 NOTE — Anesthesia Postprocedure Evaluation (Signed)
  Anesthesia Post-op Note  Patient: Toni Mcdonald  Procedure(s) Performed: Procedure(s) (LRB) with comments: ANTERIOR CERVICAL DECOMPRESSION/DISCECTOMY FUSION 3 LEVELS (N/A) - Anterior Cervical Diskectomy/Fusion with Plate, Cervical four through seven  Patient Location: PACU  Anesthesia Type: General  Level of Consciousness: awake, alert  and oriented  Airway and Oxygen Therapy: Patient Spontanous Breathing and Patient connected to nasal cannula oxygen  Post-op Pain: mild  Post-op Assessment: Post-op Vital signs reviewed and Patient's Cardiovascular Status Stable  Post-op Vital Signs: stable  Complications: No apparent anesthesia complications

## 2012-09-22 NOTE — Transfer of Care (Signed)
Immediate Anesthesia Transfer of Care Note  Patient: Toni Mcdonald  Procedure(s) Performed: Procedure(s) (LRB) with comments: ANTERIOR CERVICAL DECOMPRESSION/DISCECTOMY FUSION 3 LEVELS (N/A) - Anterior Cervical Diskectomy/Fusion with Plate, Cervical four through seven  Patient Location: PACU  Anesthesia Type: General  Level of Consciousness: awake, alert  and oriented  Airway & Oxygen Therapy: Patient Spontanous Breathing and Patient connected to nasal cannula oxygen  Post-op Assessment: Report given to PACU RN, Post -op Vital signs reviewed and stable and Patient moving all extremities X 4  Post vital signs: Reviewed and stable  Complications: No apparent anesthesia complications

## 2012-09-22 NOTE — Anesthesia Preprocedure Evaluation (Addendum)
Anesthesia Evaluation  Patient identified by MRN, date of birth, ID band Patient awake    Reviewed: Allergy & Precautions, H&P , NPO status , Patient's Chart, lab work & pertinent test results  Airway Mallampati: I      Dental  (+) Teeth Intact and Dental Advisory Given   Pulmonary Current Smoker,  breath sounds clear to auscultation        Cardiovascular hypertension, Rhythm:Regular Rate:Normal     Neuro/Psych  Headaches, Anxiety    GI/Hepatic GERD-  Controlled,  Endo/Other    Renal/GU      Musculoskeletal   Abdominal   Peds  Hematology   Anesthesia Other Findings   Reproductive/Obstetrics                        Anesthesia Physical Anesthesia Plan  ASA: II  Anesthesia Plan: General   Post-op Pain Management:    Induction: Intravenous  Airway Management Planned: Oral ETT  Additional Equipment:   Intra-op Plan:   Post-operative Plan: Extubation in OR  Informed Consent: I have reviewed the patients History and Physical, chart, labs and discussed the procedure including the risks, benefits and alternatives for the proposed anesthesia with the patient or authorized representative who has indicated his/her understanding and acceptance.   Dental advisory given  Plan Discussed with: CRNA and Surgeon  Anesthesia Plan Comments: (Cervical spondylosis Smoker GERD  Plan GA with oral ETT  Kipp Brood, MD )       Anesthesia Quick Evaluation

## 2012-09-22 NOTE — Anesthesia Procedure Notes (Signed)
Procedure Name: Intubation Date/Time: 09/22/2012 8:37 AM Performed by: Quentin Ore Pre-anesthesia Checklist: Patient identified, Emergency Drugs available, Suction available, Patient being monitored and Timeout performed Patient Re-evaluated:Patient Re-evaluated prior to inductionOxygen Delivery Method: Circle system utilized Preoxygenation: Pre-oxygenation with 100% oxygen Intubation Type: IV induction Ventilation: Mask ventilation without difficulty Laryngoscope Size: Mac and 3 Grade View: Grade II Tube type: Oral Number of attempts: 1 Airway Equipment and Method: Stylet Placement Confirmation: ETT inserted through vocal cords under direct vision,  positive ETCO2 and breath sounds checked- equal and bilateral Secured at: 21 cm Tube secured with: Tape Dental Injury: Teeth and Oropharynx as per pre-operative assessment

## 2012-09-22 NOTE — Preoperative (Signed)
Beta Blockers   Reason not to administer Beta Blockers:Not Applicable 

## 2012-09-22 NOTE — Op Note (Signed)
09/22/2012  11:30 AM  PATIENT:  Toni Mcdonald  51 y.o. female  PRE-OPERATIVE DIAGNOSIS:  Cervical spondylosis/ stenosis C4-5, C5-6, C6-7 with neck and arm pain.  POST-OPERATIVE DIAGNOSIS:  same  PROCEDURE:  1. Decompressive anterior cervical discectomy C4-5 C5-6 and C6-7, 2. Anterior cervical arthrodesis C4-5 C5-6 C6-7 utilizing cortico-cancellus allografts, 3. Anterior cervical plating C4-C7 utilizing a Nuvasive translational plate  SURGEON:  Marikay Alar, MD  ASSISTANTS: Lovell Sheehan  ANESTHESIA:   General  EBL: 200 ml  Total I/O In: 1500 [I.V.:1500] Out: 200 [Blood:200]  BLOOD ADMINISTERED:none  DRAINS: 7 flat JP   SPECIMEN:  No Specimen  INDICATION FOR PROCEDURE: This patient presented with a long history of neck pain radiation into the left arm. MRI showed cervical spinal stenosis and spondylosis at C4-5 and C5-6 and C6-7. she tried medical management without relief. I recommended ACDF with plating. Patient understood the risks, benefits, and alternatives and potential outcomes and wished to proceed.  PROCEDURE DETAILS: Patient was brought to the operating room placed under general endotracheal anesthesia. Patient was placed in the supine position on the operating room table. The neck was prepped with Duraprep and draped in a sterile fashion.   Three cc of local anesthesia was injected and a transverse incision was made on the right side of the neck.  Dissection was carried down thru the subcutaneous tissue and the platysma was  elevated, opened, and undermined with Metzenbaum scissors.  Dissection was then carried out thru an avascular plane leaving the sternocleidomastoid carotid artery and jugular vein laterally and the trachea and esophagus medially. The ventral aspect of the vertebral column was identified and a localizing x-ray was taken. The C6-7 level was identified. The longus colli muscles were then elevated to expose C4-5 C5-6 and C6-7 and the retractor was placed. The  annulus was incised at each level and the disc space entered. Discectomy was performed with micro-curettes and pituitary rongeurs at each level. I then used the high-speed drill to drill the endplates down to the level of the posterior longitudinal ligament at each level.  The operating microscope was draped and brought into the field provided additional magnification, illumination and visualization. Discectomy was continued posteriorly thru the disc space. Posterior longitudinal ligament was opened with a nerve hook, and then removed along with disc herniation and osteophytes, decompressing the spinal canal and thecal sac at each level. We then continued to remove osteophytic overgrowth and disc material decompressing the neural foramina and exiting nerve roots bilaterally. The scope was angled up and down to help decompress and undercut the vertebral bodies. Once the decompression was completed we could pass a nerve hook circumferentially to assure adequate decompression in the midline and in the neural foramina at each level. So by both visualization and palpation we felt we had an adequate decompression of the neural elements. We then measured the height of the intravertebral disc space and selected a 7 and 8 millimeter cortical cancellus allografts . They were then gently positioned in the intravertebral disc space and countersunk. I then used a 54 mm translational plate and placed fixed angle screws into the vertebral bodies and locked them into position. The wound was irrigated with bacitracin solution, checked for hemostasis which was established and confirmed. Once meticulous hemostasis was achieved and a 7 flat JP drain was placed, we then proceeded with closure. The platysma was closed with interrupted 3-0 undyed Vicryl suture, the subcuticular layer was closed with interrupted 3-0 undyed Vicryl suture. The skin edges were approximated  with steristrips. The drapes were removed. A sterile dressing was  applied. The patient was then awakened from general anesthesia and transferred to the recovery room in stable condition. At the end of the procedure all sponge, needle and instrument counts were correct.   PLAN OF CARE: Admit for overnight observation  PATIENT DISPOSITION:  PACU - hemodynamically stable.   Delay start of Pharmacological VTE agent (>24hrs) due to surgical blood loss or risk of bleeding:  yes

## 2012-09-22 NOTE — H&P (Signed)
Subjective:   Patient is a 51 y.o. female admitted for ACDF. The patient first presented to me with complaints of neck pain with arm pain. Onset of symptoms was several months ago. The pain is described as aching and occurs all day. The pain is rated severe, and is located at the base of the neck and radiates to the arms with some N/T. The symptoms have been progressive. Symptoms are exacerbated by extension and are relieved by meds.  Previous work up includes MRI which showed multilevel spondylosis.  Past Medical History  Diagnosis Date  . Hypertension     controlled by diet and exercise  . Anxiety   . Pneumonia     hx of  . Bronchitis     hx of  . GERD (gastroesophageal reflux disease)   . Headache     migraines  . Arthritis   . Chronic neck pain     Past Surgical History  Procedure Date  . Cardiovascular stress test     2008  . Abdominal hysterectomy   . Tubal ligation   . Diagnostic laparoscopy     exploratory surgery due to "scarring"    Allergies  Allergen Reactions  . Penicillins Anaphylaxis    "whelps"  . Codeine Itching and Swelling    "skin crawls'  . Neosporin (Neomycin-Bacitracin Zn-Polymyx)     "blisters the skin"  . Percocet (Oxycodone-Acetaminophen)     Nausea and vomiting  . Wellbutrin (Bupropion) Hives    History  Substance Use Topics  . Smoking status: Current Every Day Smoker -- 1.0 packs/day for 30 years  . Smokeless tobacco: Not on file  . Alcohol Use:      occasional    History reviewed. No pertinent family history. Prior to Admission medications   Medication Sig Start Date End Date Taking? Authorizing Provider  ALPRAZolam Prudy Feeler) 0.5 MG tablet Take 0.25 mg by mouth at bedtime as needed. For anxiety   Yes Historical Provider, MD  diphenhydrAMINE (SOMINEX) 25 MG tablet Take 25 mg by mouth at bedtime as needed.   Yes Historical Provider, MD  ibuprofen (ADVIL,MOTRIN) 800 MG tablet Take 800 mg by mouth every 8 (eight) hours as needed. For pain    Yes Historical Provider, MD  nicotine (NICODERM CQ) 14 mg/24hr patch Place 1 patch onto the skin daily.   Yes Historical Provider, MD  Polyethyl Glycol-Propyl Glycol (SYSTANE) 0.4-0.3 % SOLN Apply 1 drop to eye 2 (two) times daily as needed. For dry eyes   Yes Historical Provider, MD  sodium chloride (OCEAN) 0.65 % SOLN nasal spray Place 1 spray into the nose as needed. For dry nose   Yes Historical Provider, MD     Review of Systems  Positive ROS: neg  All other systems have been reviewed and were otherwise negative with the exception of those mentioned in the HPI and as above.  Objective: Vital signs in last 24 hours: Temp:  [98.2 F (36.8 C)] 98.2 F (36.8 C) (09/25 0624) Pulse Rate:  [95] 95  (09/25 0624) Resp:  [18] 18  (09/25 0624) BP: (146)/(90) 146/90 mmHg (09/25 0624) SpO2:  [98 %] 98 % (09/25 0624)  General Appearance: Alert, cooperative, no distress, appears stated age Head: Normocephalic, without obvious abnormality, atraumatic Eyes: PERRL, conjunctiva/corneas clear, EOM's intact, fundi benign, both eyes      Neck: Supple, symmetrical, trachea midline, no adenopathy; thyroid: No enlargement/tenderness/nodules; no carotid bruit or JVD Back: Symmetric Heart: Regular rate and rhythm Extremities: Extremities normal, atraumatic, no  cyanosis or edema Pulses: 2+ and symmetric all extremities Skin: Skin color, texture, turgor normal, no rashes or lesions  NEUROLOGIC:  Mental status: Alert and oriented x4, no aphasia, good attention span, fund of knowledge and memory  Motor Exam - grossly normal Sensory Exam - grossly normal Reflexes: 1+ Coordination - grossly normal Gait - grossly normal Balance - grossly normal Cranial Nerves: I: smell Not tested  II: visual acuity  OS: nl    OD: nl  II: visual fields Full to confrontation  II: pupils Equal, round, reactive to light  III,VII: ptosis None  III,IV,VI: extraocular muscles  Full ROM  V: mastication Normal  V: facial  light touch sensation  Normal  V,VII: corneal reflex  Present  VII: facial muscle function - upper  Normal  VII: facial muscle function - lower Normal  VIII: hearing Not tested  IX: soft palate elevation  Normal  IX,X: gag reflex Present  XI: trapezius strength  5/5  XI: sternocleidomastoid strength 5/5  XI: neck flexion strength  5/5  XII: tongue strength  Normal    Data Review Lab Results  Component Value Date   WBC 10.8* 09/15/2012   HGB 15.3* 09/15/2012   HCT 46.4* 09/15/2012   MCV 91.2 09/15/2012   PLT 364 09/15/2012   Lab Results  Component Value Date   NA 142 09/15/2012   K 4.8 09/15/2012   CL 106 09/15/2012   CO2 29 09/15/2012   BUN 7 09/15/2012   CREATININE 0.74 09/15/2012   GLUCOSE 105* 09/15/2012   Lab Results  Component Value Date   INR 0.92 09/15/2012    Assessment:   Cervical neck pain with herniated nucleus pulposus/ spondylosis/ stenosis at C4-5, C5-6 C6-7. Patient has failed conservative therapy. Planned surgery  ACDF  Plan:   I explained the condition and procedure to the patient and answered any questions.  Patient wishes to proceed with procedure as planned. Understands risks/ benefits/ and expected or typical outcomes.  Toni Mcdonald S 09/22/2012 7:25 AM

## 2012-09-23 ENCOUNTER — Encounter (HOSPITAL_COMMUNITY): Payer: Self-pay | Admitting: Neurological Surgery

## 2012-09-23 MED ORDER — HYDROMORPHONE HCL 2 MG PO TABS: 2.0000 mg | ORAL_TABLET | Freq: Four times a day (QID) | ORAL | Status: DC | PRN

## 2012-09-23 MED ORDER — CYCLOBENZAPRINE HCL 10 MG PO TABS: 10.0000 mg | ORAL_TABLET | Freq: Three times a day (TID) | ORAL | Status: DC | PRN

## 2012-09-23 NOTE — Discharge Summary (Signed)
Physician Discharge Summary  Patient ID: Toni Mcdonald MRN: [id number] DOB/AGE: April 11, 1961 51 y.o.  Admit date: 09/22/2012 Discharge date: 09/23/2012  Admission Diagnoses: cervical spondylosis    Discharge Diagnoses: same   Discharged Condition: good  Hospital Course: The patient was admitted on 09/22/2012 and taken to the operating room where the patient underwent ACDF. The patient tolerated the procedure well and was taken to the recovery room and then to the floor in stable condition. The hospital course was routine. There were no complications. The wound remained clean dry and intact. Pt had appropriate nack soreness. No complaints of arm pain or new N/T/W. The patient remained afebrile with stable vital signs, and tolerated a regular diet. The patient continued to increase activities, and pain was well controlled with oral pain medications.   Consults: None  Significant Diagnostic Studies:  Results for orders placed during the hospital encounter of 09/15/12  SURGICAL PCR SCREEN      Component Value Range   MRSA, PCR NEGATIVE  NEGATIVE   Staphylococcus aureus NEGATIVE  NEGATIVE  BASIC METABOLIC PANEL      Component Value Range   Sodium 142  135 - 145 mEq/L   Potassium 4.8  3.5 - 5.1 mEq/L   Chloride 106  96 - 112 mEq/L   CO2 29  19 - 32 mEq/L   Glucose, Bld 105 (*) 70 - 99 mg/dL   BUN 7  6 - 23 mg/dL   Creatinine, Ser 4.09  0.50 - 1.10 mg/dL   Calcium 9.6  8.4 - 81.1 mg/dL   GFR calc non Af Amer >90  >90 mL/min   GFR calc Af Amer >90  >90 mL/min  CBC WITH DIFFERENTIAL      Component Value Range   WBC 10.8 (*) 4.0 - 10.5 K/uL   RBC 5.09  3.87 - 5.11 MIL/uL   Hemoglobin 15.3 (*) 12.0 - 15.0 g/dL   HCT 91.4 (*) 78.2 - 95.6 %   MCV 91.2  78.0 - 100.0 fL   MCH 30.1  26.0 - 34.0 pg   MCHC 33.0  30.0 - 36.0 g/dL   RDW 21.3  08.6 - 57.8 %   Platelets 364  150 - 400 K/uL   Neutrophils Relative 72  43 - 77 %   Neutro Abs 7.8 (*) 1.7 - 7.7 K/uL   Lymphocytes Relative 16   12 - 46 %   Lymphs Abs 1.7  0.7 - 4.0 K/uL   Monocytes Relative 10  3 - 12 %   Monocytes Absolute 1.0  0.1 - 1.0 K/uL   Eosinophils Relative 2  0 - 5 %   Eosinophils Absolute 0.2  0.0 - 0.7 K/uL   Basophils Relative 1  0 - 1 %   Basophils Absolute 0.1  0.0 - 0.1 K/uL  PROTIME-INR      Component Value Range   Prothrombin Time 12.3  11.6 - 15.2 seconds   INR 0.92  0.00 - 1.49    Chest 2 View  09/15/2012  *RADIOLOGY REPORT*  Clinical Data: Preop for anterior cervical decompression  CHEST - 2 VIEW  Comparison: 07/16/2006  Findings: Cardiomediastinal silhouette is stable.  Mild hyperinflation.  No acute infiltrate or pleural effusion.  No pulmonary edema.  Bony thorax is unremarkable. Stable scarring in the right midlung laterally.  IMPRESSION: No active disease.  No significant change.   Original Report Authenticated By: Natasha Mead, M.D.    Dg Cervical Spine 1 View  09/22/2012  *RADIOLOGY REPORT*  Clinical Data: Neck pain  DG CERVICAL SPINE - 1 VIEW,DG C-ARM 1-60 MIN  Comparison: 05/03/2012 MRI cervical.  Findings: C-arm films document C4-C7 ACDF. Satisfactory position and alignment.  IMPRESSION: As above.   Original Report Authenticated By: Elsie Stain, M.D.    Dg C-arm 1-60 Min  09/22/2012  *RADIOLOGY REPORT*  Clinical Data: Neck pain  DG CERVICAL SPINE - 1 VIEW,DG C-ARM 1-60 MIN  Comparison: 05/03/2012 MRI cervical.  Findings: C-arm films document C4-C7 ACDF. Satisfactory position and alignment.  IMPRESSION: As above.   Original Report Authenticated By: Elsie Stain, M.D.     Antibiotics:  Anti-infectives     Start     Dose/Rate Route Frequency Ordered Stop   09/22/12 2030   vancomycin (VANCOCIN) 500 mg in sodium chloride 0.9 % 100 mL IVPB        500 mg 100 mL/hr over 60 Minutes Intravenous  Once 09/22/12 1807 09/22/12 2153   09/22/12 1245   ceFAZolin (ANCEF) IVPB 1 g/50 mL premix  Status:  Discontinued        1 g 100 mL/hr over 30 Minutes Intravenous Every 8 hours 09/22/12 1240  09/22/12 1619   09/22/12 0854   bacitracin 50,000 Units in sodium chloride irrigation 0.9 % 500 mL irrigation  Status:  Discontinued          As needed 09/22/12 0925 09/22/12 1129   09/22/12 0814   bacitracin 19147 UNITS injection     Comments: FREEZE, PATRICIA: cabinet override         09/22/12 0814 09/22/12 2014   09/21/12 1359   vancomycin (VANCOCIN) 1,500 mg in sodium chloride 0.9 % 500 mL IVPB        1,500 mg 250 mL/hr over 120 Minutes Intravenous 60 min pre-op 09/21/12 1359 09/22/12 0820          Discharge Exam: Blood pressure 119/84, pulse 97, temperature 98.6 F (37 C), temperature source Oral, resp. rate 16, SpO2 95.00%. Neurologic: Grossly normal incision CDI  Discharge Medications:     Medication List     As of 09/23/2012  8:23 AM    STOP taking these medications         ibuprofen 800 MG tablet   Commonly known as: ADVIL,MOTRIN      TAKE these medications         ALPRAZolam 0.5 MG tablet   Commonly known as: XANAX   Take 0.25 mg by mouth at bedtime as needed. For anxiety      cyclobenzaprine 10 MG tablet   Commonly known as: FLEXERIL   Take 1 tablet (10 mg total) by mouth 3 (three) times daily as needed for muscle spasms.      diphenhydrAMINE 25 MG tablet   Commonly known as: SOMINEX   Take 25 mg by mouth at bedtime as needed.      HYDROmorphone 2 MG tablet   Commonly known as: DILAUDID   Take 1 tablet (2 mg total) by mouth every 6 (six) hours as needed.      NICODERM CQ 14 mg/24hr patch   Generic drug: nicotine   Place 1 patch onto the skin daily.      sodium chloride 0.65 % Soln nasal spray   Commonly known as: OCEAN   Place 1 spray into the nose as needed. For dry nose      SYSTANE 0.4-0.3 % Soln   Generic drug: Polyethyl Glycol-Propyl Glycol   Apply 1 drop to eye 2 (two) times daily as  needed. For dry eyes        Disposition: home  Final Dx: ACDF      Discharge Orders    Future Orders Please Complete By Expires   Diet - low  sodium heart healthy      Increase activity slowly      Driving Restrictions      Comments:   2 weeks   Lifting restrictions      Comments:   Less than 10 lbs   Call MD for:  temperature >100.4      Call MD for:  persistant nausea and vomiting      Call MD for:  severe uncontrolled pain      Call MD for:  redness, tenderness, or signs of infection (pain, swelling, redness, odor or green/yellow discharge around incision site)      Call MD for:  difficulty breathing, headache or visual disturbances      No wound care         Follow-up Information    Follow up with Kimm Ungaro S, MD. Schedule an appointment as soon as possible for a visit in 2 weeks.   Contact information:   [address] [place] [address] [phone]           Signed: Tia Alert 09/23/2012, 8:23 AM

## 2013-05-30 ENCOUNTER — Other Ambulatory Visit (HOSPITAL_COMMUNITY): Payer: Self-pay | Admitting: Family Medicine

## 2013-05-30 DIAGNOSIS — Z139 Encounter for screening, unspecified: Secondary | ICD-10-CM

## 2013-05-31 ENCOUNTER — Ambulatory Visit (HOSPITAL_COMMUNITY): Payer: 59

## 2013-06-06 ENCOUNTER — Ambulatory Visit (HOSPITAL_COMMUNITY)
Admission: RE | Admit: 2013-06-06 | Discharge: 2013-06-06 | Disposition: A | Discharge status: Home / Self Care | Payer: 59 | Source: Ambulatory Visit | Attending: Family Medicine | Admitting: Family Medicine

## 2013-06-06 DIAGNOSIS — Z139 Encounter for screening, unspecified: Secondary | ICD-10-CM

## 2013-06-06 DIAGNOSIS — Z1231 Encounter for screening mammogram for malignant neoplasm of breast: Secondary | ICD-10-CM | POA: Insufficient documentation

## 2013-06-27 ENCOUNTER — Other Ambulatory Visit: Payer: Self-pay | Admitting: Family Medicine

## 2013-06-27 ENCOUNTER — Other Ambulatory Visit (HOSPITAL_COMMUNITY)
Admission: RE | Admit: 2013-06-27 | Discharge: 2013-06-27 | Disposition: A | Discharge status: Home / Self Care | Payer: 59 | Source: Ambulatory Visit | Attending: Family Medicine | Admitting: Family Medicine

## 2013-06-27 DIAGNOSIS — Z01419 Encounter for gynecological examination (general) (routine) without abnormal findings: Secondary | ICD-10-CM | POA: Insufficient documentation

## 2014-07-10 ENCOUNTER — Encounter: Payer: Self-pay | Admitting: *Deleted

## 2014-07-20 ENCOUNTER — Other Ambulatory Visit (HOSPITAL_COMMUNITY): Payer: Self-pay | Admitting: Family Medicine

## 2014-07-20 DIAGNOSIS — Z139 Encounter for screening, unspecified: Secondary | ICD-10-CM

## 2014-07-24 ENCOUNTER — Ambulatory Visit (HOSPITAL_COMMUNITY)
Admission: RE | Admit: 2014-07-24 | Discharge: 2014-07-24 | Disposition: A | Discharge status: Home / Self Care | Payer: BC Managed Care – PPO | Source: Ambulatory Visit | Attending: Family Medicine | Admitting: Family Medicine

## 2014-07-24 DIAGNOSIS — R928 Other abnormal and inconclusive findings on diagnostic imaging of breast: Secondary | ICD-10-CM | POA: Insufficient documentation

## 2014-07-24 DIAGNOSIS — Z139 Encounter for screening, unspecified: Secondary | ICD-10-CM

## 2014-07-24 DIAGNOSIS — Z1231 Encounter for screening mammogram for malignant neoplasm of breast: Secondary | ICD-10-CM | POA: Insufficient documentation

## 2014-07-27 ENCOUNTER — Encounter: Payer: Self-pay | Admitting: Physician Assistant

## 2014-07-27 ENCOUNTER — Ambulatory Visit (INDEPENDENT_AMBULATORY_CARE_PROVIDER_SITE_OTHER): Payer: BC Managed Care – PPO | Admitting: Physician Assistant

## 2014-07-27 VITALS — BP 122/68 | HR 72 | Temp 98.3°F | Resp 14 | Ht 60.0 in | Wt 117.0 lb

## 2014-07-27 DIAGNOSIS — F419 Anxiety disorder, unspecified: Secondary | ICD-10-CM | POA: Insufficient documentation

## 2014-07-27 DIAGNOSIS — Z0000 Encounter for general adult medical examination without abnormal findings: Secondary | ICD-10-CM

## 2014-07-27 DIAGNOSIS — F411 Generalized anxiety disorder: Secondary | ICD-10-CM

## 2014-07-27 DIAGNOSIS — E559 Vitamin D deficiency, unspecified: Secondary | ICD-10-CM | POA: Insufficient documentation

## 2014-07-27 DIAGNOSIS — Z1212 Encounter for screening for malignant neoplasm of rectum: Secondary | ICD-10-CM

## 2014-07-27 DIAGNOSIS — F17200 Nicotine dependence, unspecified, uncomplicated: Secondary | ICD-10-CM | POA: Insufficient documentation

## 2014-07-27 DIAGNOSIS — I10 Essential (primary) hypertension: Secondary | ICD-10-CM | POA: Insufficient documentation

## 2014-07-27 DIAGNOSIS — Z1211 Encounter for screening for malignant neoplasm of colon: Secondary | ICD-10-CM

## 2014-07-27 NOTE — Progress Notes (Signed)
Patient ID: Toni Mcdonald MRN: [id number], DOB: [birth year], 53 y.o. Date of Encounter: 07/27/2014,   Chief Complaint: Physical (CPE)  HPI: 53 y.o. y/o female  here for CPE.   She is also being seen as a new patient to establish care. She had been seeing a Dr. Shaune Pollack at [facility]. Says she had been seeing her for about 8 years. Her last office visit there was in December.  Her last physical there was last July 2014. Prior to going there she had gone to a doctor at [facility] for many years until he had to relocate out of town.  Says that she is changing to our office secondary to location/convenience. She lives in [place]. Says that she started a new job in February. Her new job is much closer to her house. Her new job is much closer to our office and this will be much more convenient for her to come here.  She has no active complaints today.  She takes Norvasc daily for hypertension. She has no adverse effects with this.  She says that she rarely uses Xanax. She actually has old bottles with her dated 2013 and 2014. The bottle filled June 2014 still has multiple pills remaining in it.   Review of Systems: Consitutional: No fever, chills, fatigue, night sweats, lymphadenopathy. No significant/unexplained weight changes. Eyes: No visual changes, eye redness, or discharge. ENT/Mouth: No ear pain, sore throat, nasal drainage, or sinus pain. Cardiovascular: No chest pressure,heaviness, tightness or squeezing, even with exertion. No increased shortness of breath or dyspnea on exertion.No palpitations, edema, orthopnea, PND. Respiratory: No cough, hemoptysis, SOB, or wheezing. Gastrointestinal: No anorexia, dysphagia, reflux, pain, nausea, vomiting, hematemesis, diarrhea, constipation, BRBPR, or melena. Breast: No mass, nodules, bulging, or retraction. No skin changes or inflammation. No nipple discharge. No lymphadenopathy. Genitourinary: No dysuria,  hematuria, incontinence, vaginal discharge, pruritis, burning, abnormal bleeding, or pain. Musculoskeletal: No decreased ROM, No joint pain or swelling. No significant pain in neck, back, or extremities. Skin: No rash, pruritis, or concerning lesions. Neurological: No headache, dizziness, syncope, seizures, tremors, memory loss, coordination problems, or paresthesias. Psychological: No anxiety, depression, hallucinations, SI/HI. Endocrine: No polydipsia, polyphagia, polyuria, or known diabetes.No increased fatigue. No palpitations/rapid heart rate. No significant/unexplained weight change. All other systems were reviewed and are otherwise negative.  Past Medical History  Diagnosis Date  . Hypertension     controlled by diet and exercise  . Anxiety   . Pneumonia     hx of  . Bronchitis     hx of  . GERD (gastroesophageal reflux disease)   . Headache(784.0)     migraines  . Arthritis   . Chronic neck pain      Past Surgical History  Procedure Laterality Date  . Cardiovascular stress test      2008  . Tubal ligation    . Diagnostic laparoscopy      exploratory surgery due to "scarring"  . Anterior cervical decomp/discectomy fusion  09/22/2012    Procedure: ANTERIOR CERVICAL DECOMPRESSION/DISCECTOMY FUSION 3 LEVELS;  Surgeon: Tia Alert, MD;  Location: [facility];  Service: Neurosurgery;  Laterality: N/A;  Anterior Cervical Diskectomy/Fusion with Plate, Cervical four through seven  . Abdominal hysterectomy      Has both ovaries    Home Meds:  Outpatient Prescriptions Prior to Visit  Medication Sig Dispense Refill  . ALPRAZolam (XANAX) 0.5 MG tablet Take 0.25 mg by mouth at bedtime as needed. For anxiety      .  amLODipine (NORVASC) 5 MG tablet Take 5 mg by mouth daily.      Marland Kitchen. ibuprofen (ADVIL,MOTRIN) 400 MG tablet Take 400 mg by mouth every 6 (six) hours as needed.      Marland Kitchen. Propylene Glycol (SYSTANE BALANCE) 0.6 % SOLN Place 2 drops into both eyes daily.      . sodium  chloride (OCEAN) 0.65 % SOLN nasal spray Place 1 spray into the nose as needed. For dry nose      . ergocalciferol (VITAMIN D2) 50000 UNITS capsule Take 50,000 Units by mouth once a week.      . Probiotic Product (PHILLIPS COLON HEALTH PO) Take 1 capsule by mouth daily.       No facility-administered medications prior to visit.    Allergies:  Allergies  Allergen Reactions  . Penicillins Anaphylaxis    "whelps"  . Codeine Itching and Swelling    "skin crawls'  . Neosporin [Neomycin-Bacitracin Zn-Polymyx]     "blisters the skin"  . Percocet [Oxycodone-Acetaminophen]     Nausea and vomiting  . Wellbutrin [Bupropion] Hives    History   Social History  . Marital Status: Married    Spouse Name: N/A    Number of Children: N/A  . Years of Education: N/A   Occupational History  . Not on file.   Social History Main Topics  . Smoking status: Current Every Day Smoker -- 1.00 packs/day for 35 years    Types: Cigarettes  . Smokeless tobacco: Never Used  . Alcohol Use: 2.4 oz/week    4 Glasses of wine per week     Comment: occasional  . Drug Use: No  . Sexual Activity: Yes   Other Topics Concern  . Not on file   Social History Narrative   Married.    2 children. 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 Arthritis Mother   . Heart disease Mother   . Hyperlipidemia Mother   . Hypertension Mother   . Stroke Mother   . Heart disease Father   . Hyperlipidemia Father   . Hypertension Father   . Kidney disease Father   . Stroke Father   . Thyroid disease Sister   . Thyroid disease Sister   . Thyroid disease Sister   . Vision loss Maternal Grandfather   . Early death Brother   . Heart disease Brother 6850    Died at 3653 with MI    Physical Exam: Blood pressure 122/68, pulse 72, temperature 98.3 F (36.8 C), temperature source Oral, resp. rate 14, height  5' (1.524 m), weight 117 lb (53.071 kg)., Body mass index is 22.85 kg/(m^2). General: Well developed, well nourished, WF. Very Pleasant.Appears in no acute distress. HEENT: Normocephalic, atraumatic. Conjunctiva pink, sclera non-icteric. Pupils 2 mm constricting to 1 mm, round, regular, and equally reactive to light and accomodation. EOMI. Internal auditory canal clear. TMs with good cone of light and without pathology. Nasal mucosa pink. Nares are without discharge. No sinus tenderness. Oral mucosa pink.  Pharynx without exudate.   Neck: Supple. Trachea midline. No thyromegaly. Full ROM. No lymphadenopathy.No Carotid Bruits. Lungs: Clear to auscultation bilaterally without wheezes, rales, or rhonchi. Breathing is of normal effort and unlabored. Cardiovascular: RRR with S1 S2. No murmurs, rubs, or gallops. Distal pulses 2+ symmetrically. No carotid or abdominal bruits. Breast: Symmetrical. No masses. Nipples without  discharge. Abdomen: Soft, non-tender, non-distended with normoactive bowel sounds. No hepatosplenomegaly or masses. No rebound/guarding. No CVA tenderness. No hernias.  Genitourinary:  External genitalia without lesions. Vaginal mucosa pink.No discharge present. H/O Hysterectomy. No adnexal mass or tenderness.  Musculoskeletal: Full range of motion and 5/5 strength throughout. Without swelling, atrophy, tenderness, crepitus, or warmth. Extremities without clubbing, cyanosis, or edema.  Skin: Warm and moist without erythema, ecchymosis, wounds, or rash. Neuro: A+Ox3. CN II-XII grossly intact. Moves all extremities spontaneously. Full sensation throughout. Normal gait. DTR 2+ throughout upper and lower extremities. Finger to nose intact. Psych:  Responds to questions appropriately with a normal affect.very pleasant.    Assessment/Plan:  53 y.o. y/o female here for CPE 1. Visit for preventive health examination  A. Screening Labs: She is not fasting but says that she can return fasting  for labs. - CBC with Differential; Future - COMPLETE METABOLIC PANEL WITH GFR; Future - Lipid panel; Future - TSH; Future - Vit D  25 hydroxy (rtn osteoporosis monitoring); Future  B. Pap: She has had hysterectomy. This was not performed secondary to any cancer. Therefore she does not need further Pap smears. Pelvic Exam is normal.  C. Screening Mammogram: She states that she just had a mammogram this past Monday at [facility]  D. DEXA/BMD:  She does report that her mother has a history of osteoporosis. She is a smoker. Patient had hysterectomy but did not have her ovaries removed. Can wait closer to age 49 or 37 to do bone density test.  E. Colorectal Cancer Screening:  Screening for colorectal cancer - Ambulatory referral to Gastroenterology   F. Immunizations:  Influenza: She states that she does get influenza vaccine each year Tetanus: She received this 2009 Pneumococcal:  She received this 2014. Zostavax: Discuss this at age 61     2. Essential hypertension, benign Pressure well controlled. Continue current medication.  3. Anxiety She rarely uses Xanax.  4. Vitamin D deficiency He is on over-the-counter vitamin D. We'll recheck vitamin D level if he to lab.  5. Smoker She says that she has used Chantix 3 times in the past. Caused adverse effects. She is aware of medical risk associated with continued smoking.  Routine office visit 6 months or sooner if needed.  Signed, [address] [place], [place], [facility] 07/27/2014 8:34 AM

## 2014-07-28 ENCOUNTER — Other Ambulatory Visit: Payer: Self-pay | Admitting: Family Medicine

## 2014-07-28 DIAGNOSIS — R928 Other abnormal and inconclusive findings on diagnostic imaging of breast: Secondary | ICD-10-CM

## 2014-08-02 ENCOUNTER — Encounter (INDEPENDENT_AMBULATORY_CARE_PROVIDER_SITE_OTHER): Payer: Self-pay | Admitting: *Deleted

## 2014-08-15 ENCOUNTER — Ambulatory Visit (HOSPITAL_COMMUNITY)
Admission: RE | Admit: 2014-08-15 | Discharge: 2014-08-15 | Disposition: A | Discharge status: Home / Self Care | Payer: BC Managed Care – PPO | Source: Ambulatory Visit | Attending: Family Medicine | Admitting: Family Medicine

## 2014-08-15 ENCOUNTER — Telehealth: Payer: Self-pay | Admitting: Physician Assistant

## 2014-08-15 ENCOUNTER — Other Ambulatory Visit: Payer: Self-pay | Admitting: Family Medicine

## 2014-08-15 DIAGNOSIS — R928 Other abnormal and inconclusive findings on diagnostic imaging of breast: Secondary | ICD-10-CM

## 2014-08-15 DIAGNOSIS — N6489 Other specified disorders of breast: Secondary | ICD-10-CM | POA: Insufficient documentation

## 2014-08-15 MED ORDER — AMLODIPINE BESYLATE 5 MG PO TABS: 5.0000 mg | ORAL_TABLET | Freq: Every day | ORAL | Status: DC

## 2014-08-15 NOTE — Telephone Encounter (Signed)
[phone]  Pharmacy [facility]  Pt is needing a refill on her amLODipine (NORVASC) 5 MG tablet

## 2014-08-15 NOTE — Telephone Encounter (Signed)
Med sent to pharm 

## 2014-08-17 NOTE — Progress Notes (Signed)
This patient was seen as a new patient and for a complete  physical exam by me on 07/27/14. At that visit she was not fasting but said that she would return fasting for labs. Please call her and remind her to come fasting for labs--tell her that if she does not come soon that her insurance may not pay for these labs as they have to be associated with the complete physical exam done 07/27/14

## 2015-01-03 ENCOUNTER — Encounter: Payer: Self-pay | Admitting: Physician Assistant

## 2015-01-03 ENCOUNTER — Ambulatory Visit (INDEPENDENT_AMBULATORY_CARE_PROVIDER_SITE_OTHER): Payer: BLUE CROSS/BLUE SHIELD | Admitting: Physician Assistant

## 2015-01-03 VITALS — BP 104/72 | HR 84 | Temp 98.1°F | Resp 18 | Wt 124.0 lb

## 2015-01-03 DIAGNOSIS — J988 Other specified respiratory disorders: Secondary | ICD-10-CM

## 2015-01-03 DIAGNOSIS — B9689 Other specified bacterial agents as the cause of diseases classified elsewhere: Secondary | ICD-10-CM

## 2015-01-03 DIAGNOSIS — J029 Acute pharyngitis, unspecified: Secondary | ICD-10-CM

## 2015-01-03 DIAGNOSIS — J0100 Acute maxillary sinusitis, unspecified: Secondary | ICD-10-CM

## 2015-01-03 LAB — RAPID STREP SCREEN (MED CTR MEBANE ONLY): STREPTOCOCCUS, GROUP A SCREEN (DIRECT): NEGATIVE

## 2015-01-03 MED ORDER — PREDNISONE 20 MG PO TABS: ORAL_TABLET | ORAL | Status: DC

## 2015-01-03 MED ORDER — AZITHROMYCIN 250 MG PO TABS: ORAL_TABLET | ORAL | Status: DC

## 2015-01-03 NOTE — Progress Notes (Signed)
Patient ID: SHILO PHILIPSON MRN: [id number], DOB: 10-20-1961, 54 y.o. Date of Encounter: 01/03/2015, 12:40 PM    Chief Complaint:  Chief Complaint  Patient presents with  . sick x 1 day    sinuses, ears hurt, sore throat     HPI: 54 y.o. year old white female is that the sinus pain just started yesterday. However says that she has had chest congestion with cough and phlegm for 3 weeks. Says that it is recently also started to include her head and sinuses. Unable to blow anything out of her nose but feels drainage down her throat. Yesterday her left maxillary sinus became swollen and painful and even feels pain towards her left ear today. No known fevers or chills. Positive smoker.     Home Meds:   Outpatient Prescriptions Prior to Visit  Medication Sig Dispense Refill  . ALPRAZolam (XANAX) 0.5 MG tablet Take 0.25 mg by mouth at bedtime as needed. For anxiety    . amLODipine (NORVASC) 5 MG tablet Take 1 tablet (5 mg total) by mouth daily. 30 tablet 5  . ibuprofen (ADVIL,MOTRIN) 400 MG tablet Take 400 mg by mouth every 6 (six) hours as needed.    Marland Kitchen Propylene Glycol (SYSTANE BALANCE) 0.6 % SOLN Place 2 drops into both eyes daily.    . sodium chloride (OCEAN) 0.65 % SOLN nasal spray Place 1 spray into the nose as needed. For dry nose    . Cholecalciferol (VITAMIN D-3) 1000 UNITS CAPS Take 1 capsule by mouth daily.    . ergocalciferol (VITAMIN D2) 50000 UNITS capsule Take 50,000 Units by mouth once a week.    . Probiotic Product (PHILLIPS COLON HEALTH PO) Take 1 capsule by mouth daily.     No facility-administered medications prior to visit.    Allergies:  Allergies  Allergen Reactions  . Penicillins Anaphylaxis    "whelps"  . Codeine Itching and Swelling    "skin crawls'  . Neosporin [Neomycin-Bacitracin Zn-Polymyx]     "blisters the skin"  . Percocet [Oxycodone-Acetaminophen]     Nausea and vomiting  . Wellbutrin [Bupropion] Hives      Review of Systems: See HPI for  pertinent ROS. All other ROS negative.    Physical Exam: Blood pressure 104/72, pulse 84, temperature 98.1 F (36.7 C), temperature source Oral, resp. rate 18, weight 124 lb (56.246 kg)., Body mass index is 24.22 kg/(m^2). General: WNWD WF.  Appears in no acute distress. HEENT: Normocephalic, atraumatic, eyes without discharge, sclera non-icteric, nares are without discharge. Bilateral auditory canals clear, TM's are without perforation, pearly grey and translucent with reflective cone of light bilaterally. Oral cavity moist, posterior pharynx without exudate, erythema, peritonsillar abscess. Left maxillary sinus is visibly swollen. It is tender with percussion. No tenderness with percussion of frontal sinuses or right maxillary sinus.  Neck: Supple. No thyromegaly. No lymphadenopathy. Lungs: Clear bilaterally to auscultation without wheezes, rales, or rhonchi. Breathing is unlabored. Heart: Regular rhythm. No murmurs, rubs, or gallops. Msk:  Strength and tone normal for age. Extremities/Skin: Warm and dry. Neuro: Alert and oriented X 3. Moves all extremities spontaneously. Gait is normal. CNII-XII grossly in tact. Psych:  Responds to questions appropriately with a normal affect.   Results for orders placed or performed in visit on 01/03/15  Rapid strep screen  Result Value Ref Range   Source THROAT    Streptococcus, Group A Screen (Direct) NEG NEGATIVE     ASSESSMENT AND PLAN:  54 y.o. year old female with  1. Acute maxillary sinusitis, recurrence not specified - azithromycin (ZITHROMAX) 250 MG tablet; Day 1: Take 2 daily.  Days 2-5: Take 1 daily.  Dispense: 6 tablet; Refill: 0 - predniSONE (DELTASONE) 20 MG tablet; Take 3 daily for 2 days, then 2 daily for 2 days, then 1 daily for 2 days.  Dispense: 12 tablet; Refill: 0  2. Bacterial respiratory infection - azithromycin (ZITHROMAX) 250 MG tablet; Day 1: Take 2 daily.  Days 2-5: Take 1 daily.  Dispense: 6 tablet; Refill: 0  3.  Sorethroat - Rapid strep screen   Penicillin allergy. She is to complete the antibiotic and prednisone as directed. If Symptoms do not resolve after completion of these, then she is to follow up.   [address]igned, Tanita Palinkas [place], [place], [facility] 01/03/2015 12:40 PM

## 2015-02-01 ENCOUNTER — Encounter: Payer: Self-pay | Admitting: Physician Assistant

## 2015-02-01 ENCOUNTER — Ambulatory Visit (INDEPENDENT_AMBULATORY_CARE_PROVIDER_SITE_OTHER): Payer: BLUE CROSS/BLUE SHIELD | Admitting: Physician Assistant

## 2015-02-01 VITALS — BP 114/70 | HR 92 | Temp 98.9°F | Resp 18 | Ht 59.0 in | Wt 122.0 lb

## 2015-02-01 DIAGNOSIS — R5383 Other fatigue: Secondary | ICD-10-CM

## 2015-02-01 DIAGNOSIS — Z1211 Encounter for screening for malignant neoplasm of colon: Secondary | ICD-10-CM

## 2015-02-01 DIAGNOSIS — Z720 Tobacco use: Secondary | ICD-10-CM

## 2015-02-01 DIAGNOSIS — E559 Vitamin D deficiency, unspecified: Secondary | ICD-10-CM

## 2015-02-01 DIAGNOSIS — Z1212 Encounter for screening for malignant neoplasm of rectum: Secondary | ICD-10-CM

## 2015-02-01 DIAGNOSIS — F419 Anxiety disorder, unspecified: Secondary | ICD-10-CM

## 2015-02-01 DIAGNOSIS — B9689 Other specified bacterial agents as the cause of diseases classified elsewhere: Secondary | ICD-10-CM

## 2015-02-01 DIAGNOSIS — I10 Essential (primary) hypertension: Secondary | ICD-10-CM

## 2015-02-01 DIAGNOSIS — E785 Hyperlipidemia, unspecified: Secondary | ICD-10-CM

## 2015-02-01 DIAGNOSIS — J988 Other specified respiratory disorders: Secondary | ICD-10-CM

## 2015-02-01 DIAGNOSIS — F17200 Nicotine dependence, unspecified, uncomplicated: Secondary | ICD-10-CM

## 2015-02-01 LAB — CBC WITH DIFFERENTIAL/PLATELET
Basophils Absolute: 0.1 10*3/uL (ref 0.0–0.1)
Basophils Relative: 1 % (ref 0–1)
EOS ABS: 0.1 10*3/uL (ref 0.0–0.7)
EOS PCT: 1 % (ref 0–5)
HEMATOCRIT: 42.5 % (ref 36.0–46.0)
HEMOGLOBIN: 14.4 g/dL (ref 12.0–15.0)
LYMPHS PCT: 19 % (ref 12–46)
Lymphs Abs: 1.9 10*3/uL (ref 0.7–4.0)
MCH: 29.3 pg (ref 26.0–34.0)
MCHC: 33.9 g/dL (ref 30.0–36.0)
MCV: 86.4 fL (ref 78.0–100.0)
MONOS PCT: 10 % (ref 3–12)
MPV: 9.7 fL (ref 8.6–12.4)
Monocytes Absolute: 1 10*3/uL (ref 0.1–1.0)
NEUTROS ABS: 6.8 10*3/uL (ref 1.7–7.7)
NEUTROS PCT: 69 % (ref 43–77)
PLATELETS: 405 10*3/uL — AB (ref 150–400)
RBC: 4.92 MIL/uL (ref 3.87–5.11)
RDW: 13.4 % (ref 11.5–15.5)
WBC: 9.8 10*3/uL (ref 4.0–10.5)

## 2015-02-01 LAB — LIPID PANEL
CHOL/HDL RATIO: 3.2 ratio
Cholesterol: 188 mg/dL (ref 0–200)
HDL: 58 mg/dL (ref >39)
LDL Cholesterol: 113 mg/dL — ABNORMAL HIGH (ref 0–99)
Triglycerides: 85 mg/dL (ref <150)
VLDL: 17 mg/dL (ref 0–40)

## 2015-02-01 LAB — TSH: TSH: 0.628 u[IU]/mL (ref 0.350–4.500)

## 2015-02-01 LAB — COMPLETE METABOLIC PANEL WITH GFR
ALK PHOS: 121 U/L — AB (ref 39–117)
ALT: 17 U/L (ref 0–35)
AST: 18 U/L (ref 0–37)
Albumin: 4.3 g/dL (ref 3.5–5.2)
BUN: 6 mg/dL (ref 6–23)
CALCIUM: 9.8 mg/dL (ref 8.4–10.5)
CO2: 25 meq/L (ref 19–32)
CREATININE: 0.81 mg/dL (ref 0.50–1.10)
Chloride: 103 mEq/L (ref 96–112)
GFR, EST NON AFRICAN AMERICAN: 84 mL/min
GFR, Est African American: >89 mL/min
GLUCOSE: 94 mg/dL (ref 70–99)
POTASSIUM: 4.6 meq/L (ref 3.5–5.3)
SODIUM: 137 meq/L (ref 135–145)
TOTAL PROTEIN: 6.5 g/dL (ref 6.0–8.3)
Total Bilirubin: 0.4 mg/dL (ref 0.2–1.2)

## 2015-02-01 MED ORDER — LEVOFLOXACIN 750 MG PO TABS: 750.0000 mg | ORAL_TABLET | Freq: Every day | ORAL | Status: DC

## 2015-02-01 MED ORDER — ALPRAZOLAM 0.25 MG PO TABS: 0.2500 mg | ORAL_TABLET | Freq: Two times a day (BID) | ORAL | Status: DC | PRN

## 2015-02-01 NOTE — Progress Notes (Signed)
Patient ID: Toni Mcdonald MRN: [id number], DOB: May 02, 1961, 54 y.o. Date of Encounter: 02/01/2015,   Chief Complaint: Routine F/U OV  HPI: 54 y.o. y/o female  here for routine f/u OV.  She was seen by me on 07/27/2014 as a new patient to establish care at our office.   THE FOLLOWING IS COPIED FROM THE OV NOTE 07/27/2014:  She is here for CPE. She is also being seen as a new patient to establish care. She had been seeing a Dr. Shaune Pollack at [facility]. Says she had been seeing her for about 8 years. Her last office visit there was in December.  Her last physical there was last July 2014. Prior to going there she had gone to a doctor at [facility] for many years until he had to relocate out of town.  Says that she is changing to our office secondary to location/convenience. She lives in [place]. Says that she started a new job in February. Her new job is much closer to her house. Her new job is much closer to our office and this will be much more convenient for her to come here.  She has no active complaints today.  She takes Norvasc daily for hypertension. She has no adverse effects with this.  She says that she rarely uses Xanax. She actually has old bottles with her dated 2013 and 2014. The bottle filled June 2014 still has multiple pills remaining in it.    THE FOLLOWING IS ENTERED AT OV 02/01/2015:  Since her visit with me 07/27/14, she had one other visit with me on 01/03/15. At that time I prescribed a Z-Pak and prednisone. Today she says that those symptoms never completely resolved. Says that she's been having a lot of sinus congestion and drainage and congestion behind her ears. Also has a lot of chest congestion and cough.  She is also requesting refill of Xanax but on a lower dose. She currently has 0.5 mg but she breaks into  quarters. Says that she has had a lot going on recently. Illnesses with other family members in addition to her mother. As  well, her mother stayed with her for a while-- which causes her a lot of anxiety and stress and also her mother had stents placed etc. Also at the time of her physical she was not fasting and was to return fasting but she did not. However she is fasting today and wants to do that lab work today.   Review of Systems: Consitutional: No fever, chills, fatigue, night sweats, lymphadenopathy. No significant/unexplained weight changes. Eyes: No visual changes, eye redness, or discharge. ENT/Mouth: No ear pain, sore throat, nasal drainage, or sinus pain. Cardiovascular: No chest pressure,heaviness, tightness or squeezing, even with exertion. No increased shortness of breath or dyspnea on exertion.No palpitations, edema, orthopnea, PND. Respiratory: No cough, hemoptysis, SOB, or wheezing. Gastrointestinal: No anorexia, dysphagia, reflux, pain, nausea, vomiting, hematemesis, diarrhea, constipation, BRBPR, or melena. Breast: No mass, nodules, bulging, or retraction. No skin changes or inflammation. No nipple discharge. No lymphadenopathy. Genitourinary: No dysuria, hematuria, incontinence, vaginal discharge, pruritis, burning, abnormal bleeding, or pain. Musculoskeletal: No decreased ROM, No joint pain or swelling. No significant pain in neck, back, or extremities. Skin: No rash, pruritis, or concerning lesions. Neurological: No headache, dizziness, syncope, seizures, tremors, memory loss, coordination problems, or paresthesias. Psychological: No  depression, hallucinations, SI/HI. Endocrine: No polydipsia, polyphagia, polyuria, or known diabetes.No increased fatigue. No palpitations/rapid heart rate. No significant/unexplained weight change. All  other systems were reviewed and are otherwise negative.  Past Medical History  Diagnosis Date  . Hypertension     controlled by diet and exercise  . Anxiety   . Pneumonia     hx of  . Bronchitis     hx of  . GERD (gastroesophageal reflux disease)   .  Headache(784.0)     migraines  . Arthritis   . Chronic neck pain      Past Surgical History  Procedure Laterality Date  . Cardiovascular stress test      2008  . Tubal ligation    . Diagnostic laparoscopy      exploratory surgery due to "scarring"  . Anterior cervical decomp/discectomy fusion  09/22/2012    Procedure: ANTERIOR CERVICAL DECOMPRESSION/DISCECTOMY FUSION 3 LEVELS;  Surgeon: Tia Alert, MD;  Location: [facility];  Service: Neurosurgery;  Laterality: N/A;  Anterior Cervical Diskectomy/Fusion with Plate, Cervical four through seven  . Abdominal hysterectomy      Has both ovaries    Home Meds:  Outpatient Prescriptions Prior to Visit  Medication Sig Dispense Refill  . ALPRAZolam (XANAX) 0.5 MG tablet Take 0.25 mg by mouth at bedtime as needed. For anxiety    . amLODipine (NORVASC) 5 MG tablet Take 1 tablet (5 mg total) by mouth daily. 30 tablet 5  . ibuprofen (ADVIL,MOTRIN) 400 MG tablet Take 400 mg by mouth every 6 (six) hours as needed.    Marland Kitchen Propylene Glycol (SYSTANE BALANCE) 0.6 % SOLN Place 2 drops into both eyes daily.    . sodium chloride (OCEAN) 0.65 % SOLN nasal spray Place 1 spray into the nose as needed. For dry nose    . Probiotic Product (PHILLIPS COLON HEALTH PO) Take 1 capsule by mouth daily.    . Cholecalciferol (VITAMIN D-3) 1000 UNITS CAPS Take 1 capsule by mouth daily.    Marland Kitchen azithromycin (ZITHROMAX) 250 MG tablet Day 1: Take 2 daily.  Days 2-5: Take 1 daily. 6 tablet 0  . ergocalciferol (VITAMIN D2) 50000 UNITS capsule Take 50,000 Units by mouth once a week.    . predniSONE (DELTASONE) 20 MG tablet Take 3 daily for 2 days, then 2 daily for 2 days, then 1 daily for 2 days. 12 tablet 0   No facility-administered medications prior to visit.    Allergies:  Allergies  Allergen Reactions  . Penicillins Anaphylaxis    "whelps"  . Codeine Itching and Swelling    "skin crawls'  . Neosporin [Neomycin-Bacitracin Zn-Polymyx]     "blisters the skin"  .  Percocet [Oxycodone-Acetaminophen]     Nausea and vomiting  . Wellbutrin [Bupropion] Hives    History   Social History  . Marital Status: Married    Spouse Name: N/A    Number of Children: N/A  . Years of Education: N/A   Occupational History  . Not on file.   Social History Main Topics  . Smoking status: Current Every Day Smoker -- 1.00 packs/day for 35 years    Types: Cigarettes  . Smokeless tobacco: Never Used  . Alcohol Use: 2.4 oz/week    4 Glasses of wine per week     Comment: occasional  . Drug Use: No  . Sexual Activity: Yes   Other Topics Concern  . Not on file   Social History Narrative   Married.    2 children. 6 grandchildren   Works as Production designer, theatre/television/film at [employer]--On her feet, walking all day at work   No other  exercise    Family History  Problem Relation Age of Onset  . Arthritis Mother   . Heart disease Mother   . Hyperlipidemia Mother   . Hypertension Mother   . Stroke Mother   . Heart disease Father   . Hyperlipidemia Father   . Hypertension Father   . Kidney disease Father   . Stroke Father   . Thyroid disease Sister   . Thyroid disease Sister   . Thyroid disease Sister   . Vision loss Maternal Grandfather   . Early death Brother   . Heart disease Brother 46    Died at 70 with MI    Physical Exam: Blood pressure 114/70, pulse 92, temperature 98.9 F (37.2 C), temperature source Oral, resp. rate 18, height  (1.499 m), weight 122 lb (55.339 kg)., Body mass index is 24.63 kg/(m^2). General: Well developed, well nourished, WF. Very Pleasant.Appears in no acute distress. HEENT: Normocephalic, atraumatic. Conjunctiva pink, sclera non-icteric.  Internal auditory canal clear. TMs with good cone of light and without pathology. Nasal mucosa pink. Nares are without discharge. Minimal sinus tenderness. Oral mucosa pink.  Pharynx without exudate.   Neck: Supple. Trachea midline. No thyromegaly. Full ROM. No lymphadenopathy.No Carotid Bruits. Lungs:  Clear to auscultation bilaterally without wheezes, rales, or rhonchi. Breathing is of normal effort and unlabored. Cardiovascular: RRR with S1 S2. No murmurs, rubs, or gallops.  Abdomen: Soft, non-tender, non-distended with normoactive bowel sounds. No hepatosplenomegaly or masses. No rebound/guarding. No CVA tenderness. No hernias.  Musculoskeletal: Full range of motion and 5/5 strength throughout. Skin: Warm and moist. Neuro: A+Ox3. CN II-XII grossly intact. Moves all extremities spontaneously. Full sensation throughout. Normal gait. Psych:  Responds to questions appropriately with a normal affect.very pleasant.    Assessment/Plan:  54 y.o. y/o female here for   1. Essential hypertension, benign Blood pressure at goal and controlled on current medication.  2. Anxiety - ALPRAZolam (XANAX) 0.25 MG tablet; Take 1 tablet (0.25 mg total) by mouth 2 (two) times daily as needed for anxiety.  Dispense: 30 tablet; Refill: 0  3. Vitamin D deficiency - Vit D  25 hydroxy (rtn osteoporosis monitoring)  4. Smoker  5. Bacterial respiratory infection - levofloxacin (LEVAQUIN) 750 MG tablet; Take 1 tablet (750 mg total) by mouth daily.  Dispense: 7 tablet; Refill: 0  6. Other fatigue - CBC with Differential/Platelet - COMPLETE METABOLIC PANEL WITH GFR - TSH  7. Hyperlipidemia - Lipid panel  8. Screening for colorectal cancer - Ambulatory referral to Gastroenterology   THE FOLLOWING IS COPIED FROM HER CPE NOTE 07/27/2014:   1. Visit for preventive health examination  A. Screening Labs: She is not fasting but says that she can return fasting for labs. - CBC with Differential; Future - COMPLETE METABOLIC PANEL WITH GFR; Future - Lipid panel; Future - TSH; Future - Vit D  25 hydroxy (rtn osteoporosis monitoring); Future  B. Pap: She has had hysterectomy. This was not performed secondary to any cancer. Therefore she does not need further Pap smears. Pelvic Exam is normal.  C. Screening  Mammogram: She states that she just had a mammogram this past Monday at [facility]  D. DEXA/BMD:  She does report that her mother has a history of osteoporosis. She is a smoker. Patient had hysterectomy but did not have her ovaries removed. Can wait closer to age 89 or 23 to do bone density test.  E. Colorectal Cancer Screening:  Screening for colorectal cancer - Ambulatory referral to Gastroenterology  At follow-up office visit 02/01/15 she states that when she was called with GI appointment--she was called in August but the appointment was for the that December. At that time her husband was undergoing changes with his job and insurance and so she did not want the appointment to be so far off in the future. She will try to get an appointment with a different doctor but ended up playing phone tag and never got that scheduled. Today she states that their insurance is stable and should not be having any changes and she is agreeable for me to start this referral process over again.  F. Immunizations:  Influenza: She states that she does get influenza vaccine each year Tetanus: She received this 2009 Pneumococcal:  She received this 2014. Zostavax: Discuss this at age 54     2. Essential hypertension, benign Pressure well controlled. Continue current medication.  3. Anxiety She rarely uses Xanax.  4. Vitamin D deficiency He is on over-the-counter vitamin D. We'll recheck vitamin D level if he to lab.  5. Smoker She says that she has used Chantix 3 times in the past. Caused adverse effects. She is aware of medical risk associated with continued smoking.  Routine office visit 6 months or sooner if needed.  Signed, [address]Mary Beth [place], [place], [facility] 02/01/2015 8:11 AM

## 2015-02-02 LAB — VITAMIN D 25 HYDROXY (VIT D DEFICIENCY, FRACTURES): VIT D 25 HYDROXY: 23 ng/mL — AB (ref 30–100)

## 2015-02-05 ENCOUNTER — Encounter (INDEPENDENT_AMBULATORY_CARE_PROVIDER_SITE_OTHER): Payer: Self-pay | Admitting: *Deleted

## 2015-02-09 ENCOUNTER — Other Ambulatory Visit: Payer: Self-pay | Admitting: Physician Assistant

## 2015-02-09 DIAGNOSIS — Z09 Encounter for follow-up examination after completed treatment for conditions other than malignant neoplasm: Secondary | ICD-10-CM

## 2015-02-12 ENCOUNTER — Other Ambulatory Visit: Payer: Self-pay | Admitting: Family Medicine

## 2015-02-20 ENCOUNTER — Ambulatory Visit (HOSPITAL_COMMUNITY)
Admission: RE | Admit: 2015-02-20 | Discharge: 2015-02-20 | Disposition: A | Discharge status: Home / Self Care | Payer: BLUE CROSS/BLUE SHIELD | Source: Ambulatory Visit | Attending: Physician Assistant | Admitting: Physician Assistant

## 2015-02-20 DIAGNOSIS — Z09 Encounter for follow-up examination after completed treatment for conditions other than malignant neoplasm: Secondary | ICD-10-CM

## 2015-02-20 DIAGNOSIS — N63 Unspecified lump in breast: Secondary | ICD-10-CM | POA: Diagnosis present

## 2015-04-10 ENCOUNTER — Encounter: Payer: Self-pay | Admitting: Family Medicine

## 2015-04-10 ENCOUNTER — Ambulatory Visit (INDEPENDENT_AMBULATORY_CARE_PROVIDER_SITE_OTHER): Payer: BLUE CROSS/BLUE SHIELD | Admitting: Family Medicine

## 2015-04-10 VITALS — BP 122/80 | HR 70 | Temp 98.1°F | Resp 14 | Ht 59.0 in | Wt 121.0 lb

## 2015-04-10 DIAGNOSIS — R21 Rash and other nonspecific skin eruption: Secondary | ICD-10-CM | POA: Diagnosis not present

## 2015-04-10 MED ORDER — HYDROXYZINE HCL 25 MG PO TABS: 25.0000 mg | ORAL_TABLET | Freq: Three times a day (TID) | ORAL | Status: DC | PRN

## 2015-04-10 MED ORDER — PERMETHRIN 5 % EX CREA: 1.0000 "application " | TOPICAL_CREAM | Freq: Once | CUTANEOUS | Status: DC

## 2015-04-10 MED ORDER — PREDNISONE 10 MG PO TABS: ORAL_TABLET | ORAL | Status: DC

## 2015-04-10 NOTE — Patient Instructions (Signed)
Use the cream Use the atarax for itching  If not better then start the prednisone F/U as needed

## 2015-04-10 NOTE — Progress Notes (Signed)
Patient ID: Toni BarbaraBelinda E Mcdonald, female   DOB: 08/02/1961, 54 y.o.   MRN: [id number]   Subjective:    Patient ID: Toni BarbaraBelinda E Mcdonald, female    DOB: 02/12/1961, 54 y.o.   MRN: [id number]  Patient presents for Itching  patient here with itching all over. Has been going on for the past 2 weeks of worsening. She's noticed some red bumps on her arms and her legs do seem to go down a little into her skin then pop back out. She was recently in a beach trip and stayed in a hotel but no one else has been itching since they returned home. She's not change her hygiene products no new medications.  she has not had any difficulty breathing    Review Of Systems: per above   GEN- denies fatigue, fever, weight loss,weakness, recent illness HEENT- denies eye drainage, change in vision, nasal discharge, CVS- denies chest pain, palpitations RESP- denies SOB, cough, wheeze ABD- denies N/V, change in stools, abd pain GU- denies dysuria, hematuria, dribbling, incontinence MSK- denies joint pain, muscle aches, injury Neuro- denies headache, dizziness, syncope, seizure activity       Objective:    BP 122/80 mmHg  Pulse 70  Temp(Src) 98.1 F (36.7 C) (Oral)  Resp 14  Ht 4\' 11"  (1.499 m)  Wt 121 lb (54.885 kg)  BMI 24.43 kg/m2 GEN- NAD, alert and oriented x3 Skin- few scattered erythematous macular erythematous lesions on bilat legs, hands, excoriation in between left 3rd digit web space but no burrow lines or lesions, few erythematous maculopapular lesions on her abdomen  EXT- No edema Pulses- Radial, DP- 2+        Assessment & Plan:      Problem List Items Addressed This Visit    None    Visit Diagnoses    Rash and nonspecific skin eruption    -  Primary    Unclear cause of the itching and reaction, ? bed bugs, though no one lese affected, not an allergic reaction, she typically does not do well with prednsione, as possible bug bites, will give permethrin cream and atarax, if this does not  help she will take 20mg  of prednisone daily       Note: This dictation was prepared with Dragon dictation along with smaller phrase technology. Any transcriptional errors that result from this process are unintentional.

## 2015-06-12 ENCOUNTER — Other Ambulatory Visit: Payer: Self-pay | Admitting: Physician Assistant

## 2015-06-12 NOTE — Telephone Encounter (Signed)
Refill appropriate and filled per protocol. 

## 2015-07-18 ENCOUNTER — Encounter: Payer: Self-pay | Admitting: Family Medicine

## 2015-07-18 ENCOUNTER — Ambulatory Visit (INDEPENDENT_AMBULATORY_CARE_PROVIDER_SITE_OTHER): Payer: BLUE CROSS/BLUE SHIELD | Admitting: Family Medicine

## 2015-07-18 VITALS — BP 124/72 | HR 68 | Temp 98.0°F | Resp 14 | Ht 59.0 in | Wt 120.0 lb

## 2015-07-18 DIAGNOSIS — J0100 Acute maxillary sinusitis, unspecified: Secondary | ICD-10-CM

## 2015-07-18 MED ORDER — AZITHROMYCIN 250 MG PO TABS: ORAL_TABLET | ORAL | Status: DC

## 2015-07-18 MED ORDER — AMLODIPINE BESYLATE 5 MG PO TABS: 5.0000 mg | ORAL_TABLET | Freq: Every day | ORAL | Status: DC

## 2015-07-18 NOTE — Progress Notes (Signed)
Patient ID: Toni BarbaraBelinda E Kiss, female   DOB: 03/18/1961, 54 y.o.   MRN: [id number]   Subjective:    Patient ID: Toni Mcdonald, female    DOB: 05/26/1961, 54 y.o.   MRN: [id number]  Patient presents for Illness patient here with sinus pressure and drainage worsening of the past 2 weeks. She also has sinus headache. She's not had any fever. She's been using Tylenol Sinus over-the-counter. She also uses nasal saline daily. She has history of sinus problems. She is a smoker.    Review Of Systems:  GEN- denies fatigue, fever, weight loss,weakness, recent illness HEENT- denies eye drainage, change in vision,+ nasal discharge, CVS- denies chest pain, palpitations RESP- denies SOB, cough, wheeze ABD- denies N/V, change in stools, abd pain GU- denies dysuria, hematuria, dribbling, incontinence MSK- denies joint pain, muscle aches, injury Neuro-+ headache, denies dizziness, syncope, seizure activity       Objective:    BP 124/72 mmHg  Pulse 68  Temp(Src) 98 F (36.7 C) (Oral)  Resp 14  Ht 4\' 11"  (1.499 m)  Wt 120 lb (54.432 kg)  BMI 24.22 kg/m2 GEN- NAD, alert and oriented x3 HEENT- PERRL, EOMI, non injected sclera, pink conjunctiva, MMM, oropharynx mild injection, TM clear bilat no effusion,  + maxillary sinus tenderness, inflammed turbinates,  Nasal drainage  Neck- Supple, no LAD CVS- RRR, no murmur RESP-CTAB EXT- No edema Pulses- Radial 2+         Assessment & Plan:      Problem List Items Addressed This Visit    None    Visit Diagnoses    Acute maxillary sinusitis, recurrence not specified    -  Primary    Treat with zpak, mucinex, nasal saline, advised to avoid decongestants due to HTN    Relevant Medications    azithromycin (ZITHROMAX) 250 MG tablet       Note: This dictation was prepared with Dragon dictation along with smaller phrase technology. Any transcriptional errors that result from this process are unintentional.

## 2015-07-18 NOTE — Patient Instructions (Signed)
Take antibiotics Use mucinex Continue saline rinse Schedule physical - in 4 weeks

## 2015-08-15 ENCOUNTER — Encounter: Payer: Self-pay | Admitting: Family Medicine

## 2015-08-15 ENCOUNTER — Ambulatory Visit (INDEPENDENT_AMBULATORY_CARE_PROVIDER_SITE_OTHER): Payer: BLUE CROSS/BLUE SHIELD | Admitting: Family Medicine

## 2015-08-15 VITALS — BP 108/64 | HR 70 | Temp 97.9°F | Resp 16 | Ht 59.0 in | Wt 114.0 lb

## 2015-08-15 DIAGNOSIS — I10 Essential (primary) hypertension: Secondary | ICD-10-CM | POA: Diagnosis not present

## 2015-08-15 DIAGNOSIS — Z13820 Encounter for screening for osteoporosis: Secondary | ICD-10-CM

## 2015-08-15 DIAGNOSIS — E559 Vitamin D deficiency, unspecified: Secondary | ICD-10-CM | POA: Diagnosis not present

## 2015-08-15 DIAGNOSIS — Z1211 Encounter for screening for malignant neoplasm of colon: Secondary | ICD-10-CM

## 2015-08-15 DIAGNOSIS — Z720 Tobacco use: Secondary | ICD-10-CM | POA: Diagnosis not present

## 2015-08-15 DIAGNOSIS — Z0000 Encounter for general adult medical examination without abnormal findings: Secondary | ICD-10-CM

## 2015-08-15 DIAGNOSIS — F17200 Nicotine dependence, unspecified, uncomplicated: Secondary | ICD-10-CM

## 2015-08-15 DIAGNOSIS — R197 Diarrhea, unspecified: Secondary | ICD-10-CM

## 2015-08-15 LAB — CBC WITH DIFFERENTIAL/PLATELET
BASOS ABS: 0.1 10*3/uL (ref 0.0–0.1)
Basophils Relative: 1 % (ref 0–1)
Eosinophils Absolute: 0.2 10*3/uL (ref 0.0–0.7)
Eosinophils Relative: 2 % (ref 0–5)
HCT: 41.6 % (ref 36.0–46.0)
Hemoglobin: 14.4 g/dL (ref 12.0–15.0)
LYMPHS PCT: 24 % (ref 12–46)
Lymphs Abs: 2.7 10*3/uL (ref 0.7–4.0)
MCH: 29.8 pg (ref 26.0–34.0)
MCHC: 34.6 g/dL (ref 30.0–36.0)
MCV: 86.1 fL (ref 78.0–100.0)
MPV: 9.5 fL (ref 8.6–12.4)
Monocytes Absolute: 0.7 10*3/uL (ref 0.1–1.0)
Monocytes Relative: 6 % (ref 3–12)
NEUTROS PCT: 67 % (ref 43–77)
Neutro Abs: 7.4 10*3/uL (ref 1.7–7.7)
PLATELETS: 436 10*3/uL — AB (ref 150–400)
RBC: 4.83 MIL/uL (ref 3.87–5.11)
RDW: 13.7 % (ref 11.5–15.5)
WBC: 11.1 10*3/uL — AB (ref 4.0–10.5)

## 2015-08-15 LAB — LIPID PANEL
CHOL/HDL RATIO: 3.4 ratio (ref <=5.0)
CHOLESTEROL: 185 mg/dL (ref 125–200)
HDL: 55 mg/dL (ref >=46)
LDL Cholesterol: 112 mg/dL (ref <130)
Triglycerides: 91 mg/dL (ref <150)
VLDL: 18 mg/dL (ref <30)

## 2015-08-15 LAB — COMPREHENSIVE METABOLIC PANEL
ALK PHOS: 107 U/L (ref 33–130)
ALT: 12 U/L (ref 6–29)
AST: 15 U/L (ref 10–35)
Albumin: 4.3 g/dL (ref 3.6–5.1)
BILIRUBIN TOTAL: 0.4 mg/dL (ref 0.2–1.2)
BUN: 9 mg/dL (ref 7–25)
CO2: 24 mmol/L (ref 20–31)
Calcium: 9.7 mg/dL (ref 8.6–10.4)
Chloride: 103 mmol/L (ref 98–110)
Creat: 0.7 mg/dL (ref 0.50–1.05)
GLUCOSE: 87 mg/dL (ref 70–99)
POTASSIUM: 4.4 mmol/L (ref 3.5–5.3)
Sodium: 140 mmol/L (ref 135–146)
TOTAL PROTEIN: 6.3 g/dL (ref 6.1–8.1)

## 2015-08-15 LAB — TSH: TSH: 0.561 u[IU]/mL (ref 0.350–4.500)

## 2015-08-15 MED ORDER — DIPHENOXYLATE-ATROPINE 2.5-0.025 MG PO TABS: 1.0000 | ORAL_TABLET | Freq: Four times a day (QID) | ORAL | Status: DC | PRN

## 2015-08-15 NOTE — Patient Instructions (Signed)
No AVS given due to system being down Verbal instructions given

## 2015-08-15 NOTE — Assessment & Plan Note (Signed)
Encouraged on tobacco cessation

## 2015-08-15 NOTE — Assessment & Plan Note (Signed)
Vitamin D to be rechecked, taking OTC supplement

## 2015-08-15 NOTE — Progress Notes (Signed)
Patient ID: Toni Mcdonald, female   DOB: [birth year], 54 y.o.   MRN: [id number]   Subjective:    Patient ID: Toni Mcdonald, female    DOB: 1961/12/18, 54 y.o.   MRN: [id number]  Patient presents for CPE- no PAP Here for her physical exam. She is due for colonoscopy she is also due for bone density. Her mammogram is up-to-date Pap smear was done in 2014 and this was normal. She is due for fasting labs today. Her immunizations are up-to-date. She has had diarrhea since our last visit about 4 weeks ago which she was treated for sinus infection. She was given a Z-Pak at that time. Initially she had up to 16 times a day of watery stool it is now down to 6 times a day she is taking probiotics, immodium as well as eating yogurt. She is not having significant abdominal pain she does get some cramping right before she has a bowel movement. She has not had any fever no blood in her stool. She has lost 6 pounds because of the diarrhea. She states that she has been drinking to keep herself from getting dehydrated. She thought it was a typical side effect of anabolic therefore did not call or come in    Review Of Systems:  GEN- denies fatigue, fever, weight loss,weakness, recent illness HEENT- denies eye drainage, change in vision, nasal discharge, CVS- denies chest pain, palpitations RESP- denies SOB, cough, wheeze ABD- denies N/V, +change in stools, abd pain GU- denies dysuria, hematuria, dribbling, incontinence MSK- denies joint pain, muscle aches, injury Neuro- denies headache, dizziness, syncope, seizure activity       Objective:    BP 108/64 mmHg  Pulse 70  Temp(Src) 97.9 F (36.6 C) (Oral)  Resp 16  Ht  (1.499 m)  Wt 114 lb (51.71 kg)  BMI 23.01 kg/m2  LMP  GEN- NAD, alert and oriented x3, weight down 6lbs  HEENT- PERRL, EOMI, non injected sclera, pink conjunctiva, MMM, oropharynx clear Neck- Supple, no thyromegaly CVS- RRR, no murmur RESP-CTAB ABD-NABS,soft,NT,ND EXT-  No edema Pulses- Radial, DP- 2+        Assessment & Plan:      Problem List Items Addressed This Visit    Vitamin D deficiency    Vitamin D to be rechecked, taking OTC supplement       Relevant Orders   Vitamin D, 25-hydroxy   Smoker    Encouraged on tobacco cessation      Essential hypertension, benign    Well controlled, no change to medications      Relevant Orders   Lipid panel   TSH    Other Visit Diagnoses    Routine general medical examination at a health care facility    -  Primary    Colonoscopy to be scheduled    Relevant Orders    CBC with Differential/Platelet    Comprehensive metabolic panel    Diarrhea        concern possible C diff, with persistant diarrhea, improved some with probiotics, will check C diff, stool culture, FOBT, lomotil sent after stool sample collected    Relevant Orders    Stool culture    Clostridium Difficile by PCR    Fecal Occult Blood, Guaiac       Note: This dictation was prepared with Dragon dictation along with smaller phrase technology. Any transcriptional errors that result from this process are unintentional.

## 2015-08-15 NOTE — Assessment & Plan Note (Signed)
Well controlled, no change to medications 

## 2015-08-16 ENCOUNTER — Other Ambulatory Visit: Payer: BLUE CROSS/BLUE SHIELD

## 2015-08-16 LAB — VITAMIN D 25 HYDROXY (VIT D DEFICIENCY, FRACTURES): VIT D 25 HYDROXY: 42 ng/mL (ref 30–100)

## 2015-08-16 NOTE — Addendum Note (Signed)
Addended by: Alean Rinne A on: 08/16/2015 08:35 AM   Modules accepted: Orders

## 2015-08-17 LAB — CLOSTRIDIUM DIFFICILE BY PCR: CDIFFPCR: NOT DETECTED

## 2015-08-17 LAB — FECAL OCCULT BLOOD, IMMUNOCHEMICAL: FECAL OCCULT BLOOD: NEGATIVE

## 2015-08-20 ENCOUNTER — Telehealth: Payer: Self-pay | Admitting: *Deleted

## 2015-08-20 DIAGNOSIS — F419 Anxiety disorder, unspecified: Secondary | ICD-10-CM

## 2015-08-20 LAB — STOOL CULTURE

## 2015-08-20 MED ORDER — AMLODIPINE BESYLATE 5 MG PO TABS: 5.0000 mg | ORAL_TABLET | Freq: Every day | ORAL | Status: DC

## 2015-08-20 MED ORDER — ALPRAZOLAM 0.25 MG PO TABS: 0.2500 mg | ORAL_TABLET | Freq: Two times a day (BID) | ORAL | Status: DC | PRN

## 2015-08-20 NOTE — Telephone Encounter (Signed)
Okay to refill? 

## 2015-08-20 NOTE — Telephone Encounter (Signed)
Medication called to pharmacy. 

## 2015-08-20 NOTE — Telephone Encounter (Signed)
Received call from patient.   Reports that she requires refill on Norvasc and Xanax.   Norvasc sent to pharmacy.   Ok to refill Xanax??  Last office visit 08/15/2015.  Last refill 02/01/2015.

## 2015-08-24 ENCOUNTER — Encounter (INDEPENDENT_AMBULATORY_CARE_PROVIDER_SITE_OTHER): Payer: Self-pay | Admitting: *Deleted

## 2015-08-27 ENCOUNTER — Ambulatory Visit (HOSPITAL_COMMUNITY)
Admission: RE | Admit: 2015-08-27 | Discharge: 2015-08-27 | Disposition: A | Discharge status: Home / Self Care | Payer: BLUE CROSS/BLUE SHIELD | Source: Ambulatory Visit | Attending: Family Medicine | Admitting: Family Medicine

## 2015-08-27 DIAGNOSIS — Z780 Asymptomatic menopausal state: Secondary | ICD-10-CM | POA: Diagnosis not present

## 2015-08-27 DIAGNOSIS — M810 Age-related osteoporosis without current pathological fracture: Secondary | ICD-10-CM | POA: Diagnosis not present

## 2015-08-27 DIAGNOSIS — E559 Vitamin D deficiency, unspecified: Secondary | ICD-10-CM | POA: Diagnosis not present

## 2015-08-27 DIAGNOSIS — Z13820 Encounter for screening for osteoporosis: Secondary | ICD-10-CM | POA: Diagnosis not present

## 2015-08-30 ENCOUNTER — Other Ambulatory Visit (HOSPITAL_COMMUNITY): Payer: BLUE CROSS/BLUE SHIELD

## 2015-11-28 ENCOUNTER — Encounter: Payer: Self-pay | Admitting: *Deleted

## 2016-02-18 ENCOUNTER — Emergency Department
Admission: EM | Admit: 2016-02-18 | Discharge: 2016-02-18 | Disposition: A | Discharge status: Home / Self Care | Payer: BLUE CROSS/BLUE SHIELD | Attending: Emergency Medicine | Admitting: Emergency Medicine

## 2016-02-18 ENCOUNTER — Encounter: Payer: Self-pay | Admitting: Emergency Medicine

## 2016-02-18 ENCOUNTER — Telehealth: Payer: Self-pay

## 2016-02-18 ENCOUNTER — Other Ambulatory Visit: Payer: Self-pay

## 2016-02-18 ENCOUNTER — Emergency Department: Payer: BLUE CROSS/BLUE SHIELD

## 2016-02-18 DIAGNOSIS — I10 Essential (primary) hypertension: Secondary | ICD-10-CM | POA: Insufficient documentation

## 2016-02-18 DIAGNOSIS — R200 Anesthesia of skin: Secondary | ICD-10-CM | POA: Insufficient documentation

## 2016-02-18 DIAGNOSIS — R079 Chest pain, unspecified: Secondary | ICD-10-CM

## 2016-02-18 DIAGNOSIS — R0602 Shortness of breath: Secondary | ICD-10-CM | POA: Diagnosis not present

## 2016-02-18 DIAGNOSIS — Z880 Allergy status to penicillin: Secondary | ICD-10-CM | POA: Insufficient documentation

## 2016-02-18 DIAGNOSIS — Z79899 Other long term (current) drug therapy: Secondary | ICD-10-CM | POA: Insufficient documentation

## 2016-02-18 DIAGNOSIS — F17210 Nicotine dependence, cigarettes, uncomplicated: Secondary | ICD-10-CM | POA: Diagnosis not present

## 2016-02-18 DIAGNOSIS — R202 Paresthesia of skin: Secondary | ICD-10-CM | POA: Diagnosis not present

## 2016-02-18 LAB — CBC
HEMATOCRIT: 41.3 % (ref 35.0–47.0)
Hemoglobin: 14 g/dL (ref 12.0–16.0)
MCH: 29.1 pg (ref 26.0–34.0)
MCHC: 33.9 g/dL (ref 32.0–36.0)
MCV: 85.8 fL (ref 80.0–100.0)
Platelets: 409 10*3/uL (ref 150–440)
RBC: 4.81 MIL/uL (ref 3.80–5.20)
RDW: 13.7 % (ref 11.5–14.5)
WBC: 10.7 10*3/uL (ref 3.6–11.0)

## 2016-02-18 LAB — BASIC METABOLIC PANEL
Anion gap: 6 (ref 5–15)
BUN: 10 mg/dL (ref 6–20)
CHLORIDE: 104 mmol/L (ref 101–111)
CO2: 27 mmol/L (ref 22–32)
CREATININE: 0.75 mg/dL (ref 0.44–1.00)
Calcium: 9 mg/dL (ref 8.9–10.3)
GFR calc Af Amer: >60 mL/min (ref >60)
Glucose, Bld: 105 mg/dL — ABNORMAL HIGH (ref 65–99)
Potassium: 4.2 mmol/L (ref 3.5–5.1)
SODIUM: 137 mmol/L (ref 135–145)

## 2016-02-18 LAB — TROPONIN I: Troponin I: <0.03 ng/mL (ref <0.031)

## 2016-02-18 NOTE — ED Provider Notes (Signed)
[facility] Emergency Department Provider Note   ____________________________________________  Time seen: Approximately 9 I have reviewed the triage vital signs and the triage nursing note.  HISTORY  Chief Complaint Chest Pain   Historian Patient  HPI Toni Mcdonald is a 55 y.o. female here for 1.5 hrs of left side CP assoc with sob, left arm tingling.  Took  aspirin.  Occurred with walking up stairs.  No n/v.  Started at 8am while walking prior to work.  Currently some numbness tingling of left neck, no pain.  Neg stress test years ago.  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    Past Medical History  Diagnosis Date  . Hypertension     controlled by diet and exercise  . Anxiety   . Pneumonia     hx of  . Bronchitis     hx of  . GERD (gastroesophageal reflux disease)   . Headache(784.0)     migraines  . Arthritis   . Chronic neck pain     Patient Active Problem List   Diagnosis Date Noted  . Essential hypertension, benign 07/27/2014  . Anxiety 07/27/2014  . Vitamin D deficiency 07/27/2014  . Smoker 07/27/2014    Past Surgical History  Procedure Laterality Date  . Cardiovascular stress test      2008  . Tubal ligation    . Diagnostic laparoscopy      exploratory surgery due to "scarring"  . Anterior cervical decomp/discectomy fusion  09/22/2012    Procedure: ANTERIOR CERVICAL DECOMPRESSION/DISCECTOMY FUSION 3 LEVELS;  Surgeon: Tia Alert, MD;  Location: [facility];  Service: Neurosurgery;  Laterality: N/A;  Anterior Cervical Diskectomy/Fusion with Plate, Cervical four through seven  . Abdominal hysterectomy      Has both ovaries    Current Outpatient Rx  Name  Route  Sig  Dispense  Refill  . ALPRAZolam (XANAX) 0.25 MG tablet   Oral   Take 1 tablet (0.25 mg total) by mouth 2 (two) times daily as needed for anxiety.   30 tablet   0   . amLODipine (NORVASC) 5 MG tablet   Oral   Take 1 tablet  (5 mg total) by mouth daily.   30 tablet   3   . ibuprofen (ADVIL,MOTRIN) 400 MG tablet   Oral   Take 400 mg by mouth every 6 (six) hours as needed.         . Lactobacillus-Inulin (CULTURELLE DIGESTIVE HEALTH) CAPS   Oral   Take 1 capsule by mouth daily.         Marland Kitchen Propylene Glycol (SYSTANE BALANCE) 0.6 % SOLN   Both Eyes   Place 2 drops into both eyes daily.         . sodium chloride (OCEAN) 0.65 % SOLN nasal spray   Nasal   Place 1 spray into the nose as needed. For dry nose         . vitamin B-12 (CYANOCOBALAMIN) 1000 MCG tablet   Oral   Take 1,000 mcg by mouth daily.           Allergies Penicillins; Azithromycin; Codeine; Neosporin; Percocet; and Wellbutrin  Family History  Problem Relation Age of Onset  . Arthritis Mother   . Heart disease Mother   . Hyperlipidemia Mother   . Hypertension Mother   . Stroke Mother   . Heart disease Father   . Hyperlipidemia Father   . Hypertension Father   .  Kidney disease Father   . Stroke Father   . Thyroid disease Sister   . Thyroid disease Sister   . Thyroid disease Sister   . Vision loss Maternal Grandfather   . Early death Brother   . Heart disease Brother 74    Died at 11 with MI  Brother with MI young  Social History Social History  Substance Use Topics  . Smoking status: Current Every Day Smoker -- 1.00 packs/day for 35 years    Types: Cigarettes  . Smokeless tobacco: Never Used  . Alcohol Use: 2.4 oz/week    4 Glasses of wine per week     Comment: occasional    Review of Systems  Constitutional: Negative for fever. Eyes: Negative for visual changes. ENT: Negative for sore throat. Cardiovascular: Negative for chest pain. Respiratory: Negative for shortness of breath. Gastrointestinal: Negative for abdominal pain, vomiting and diarrhea. Genitourinary: Negative for dysuria. Musculoskeletal: Negative for back pain. Skin: Negative for rash. Neurological: Negative for headache. 10 point Review  of Systems otherwise negative ____________________________________________   PHYSICAL EXAM:  VITAL SIGNS: ED Triage Vitals  Enc Vitals Group     BP 02/18/16 0831 145/95 mmHg     Pulse Rate 02/18/16 0831 100     Resp 02/18/16 0831 20     Temp 02/18/16 0831 97.5 F (36.4 C)     Temp Source 02/18/16 0831 Oral     SpO2 02/18/16 0831 100 %     Weight 02/18/16 0831 112 lb (50.803 kg)     Height 02/18/16 0831 5' (1.524 m)     Head Cir --      Peak Flow --      Pain Score 02/18/16 0832 4     Pain Loc --      Pain Edu? --      Excl. in GC? --      Constitutional: Alert and oriented. Well appearing and in no distress. HEENT   Head: Normocephalic and atraumatic.      Eyes: Conjunctivae are normal. PERRL. Normal extraocular movements.      Ears:         Nose: No congestion/rhinnorhea.   Mouth/Throat: Mucous membranes are moist.   Neck: No stridor. Cardiovascular/Chest: Normal rate, regular rhythm.  No murmurs, rubs, or gallops. Respiratory: Normal respiratory effort without tachypnea nor retractions. Breath sounds are clear and equal bilaterally. No wheezes/rales/rhonchi. Gastrointestinal: Soft. No distention, no guarding, no rebound. Nontender.    Genitourinary/rectal:Deferred Musculoskeletal: Nontender with normal range of motion in all extremities. No joint effusions.  No lower extremity tenderness.  No edema. Neurologic:  Normal speech and language. Cranial nerves II through X intact. 5 out of 5 strength in 4 extremities. Normal sensation. Finger-nose intact bilaterally. No pronator drift. No gross or focal neurologic deficits are appreciated. Skin:  Skin is warm, dry and intact. No rash noted. Psychiatric: Mood and affect are normal. Speech and behavior are normal. Patient exhibits appropriate insight and judgment.  ____________________________________________   EKG I, Governor Rooks, MD, the attending physician have personally viewed and interpreted all ECGs.  94  bpm. Narrow QRS. Normal sinus rhythm. Normal axis. Occasional PVC. Normal ST and T-wave ____________________________________________  LABS (pertinent positives/negatives)  Basic blood panel within normal limits CBC within normal limits Troponin less than 0.03 Repeat troponin less than 0.03  ____________________________________________  RADIOLOGY All Xrays were viewed by me. Imaging interpreted by Radiologist.  Chest: No acute cardio pulmonary disease. __________________________________________  PROCEDURES  Procedure(s) performed: None  Critical  Care performed: None  ____________________________________________   ED COURSE / ASSESSMENT AND PLAN  Pertinent labs & imaging results that were available during my care of the patient were reviewed by me and considered in my medical decision making (see chart for details).    Patient episode of atypical chest pain, and her last about one and a half hours. She did take aspirin prior to arrival. Her chest pain is now resolved. She's complaining of a little bit of soreness in the neck and left arm. Initially, she was describing left arm tingling, but on neurologic exam she has normal sensation and strength.  She's not having any gastrointestinal or GERD-like symptoms. She does report being under a lot of stress recently and thinks this could be partially related to stress. Her exam and evaluation are reassuring with repeat troponin remaining negative. Her EKG is reassuring.  I spoke with Dr. Kirke Corin, cardiology, whose office will call her to help schedule an appointment for close follow-up and evaluation.     CONSULTATIONS:   Phone consultation with Dr. Kirke Corin, cardiology   Patient / Family / Caregiver informed of clinical course, medical decision-making process, and agree with plan.   I discussed return precautions, follow-up instructions, and discharged instructions with patient and/or  family.   ___________________________________________   FINAL CLINICAL IMPRESSION(S) / ED DIAGNOSES   Final diagnoses:  Chest pain, unspecified              Note: This dictation was prepared with Dragon dictation. Any transcriptional errors that result from this process are unintentional   Governor Rooks, MD 02/18/16 1157

## 2016-02-18 NOTE — Telephone Encounter (Signed)
This is the the ER referral for chest pain. Schedule GXT and follow up after.   S/w pt who is agreeable w/plan GXT March 1 OV with Dr. Kirke Corin April 14. Pt is on wait list. Reviewed GXT instructions w/pt who verbalized understanding.

## 2016-02-18 NOTE — ED Notes (Signed)
Pt presents with mid sternal chest pain started this am while at work, states radiates into back and down left arm.

## 2016-02-18 NOTE — Discharge Instructions (Signed)
You were evaluated for chest pain and although no certain cause was found, your exam and evaluation are reassuring today in the emergency department.  Return to the emergency room for any worsening chest pain, trouble breathing, nausea, passing out, or any neurologic symptoms such as trouble finding her words, slurred speech, facial droop, weakness, numbness, or tingling.   Nonspecific Chest Pain It is often hard to find the cause of chest pain. There is always a chance that your pain could be related to something serious, such as a heart attack or a blood clot in your lungs. Chest pain can also be caused by conditions that are not life-threatening. If you have chest pain, it is very important to follow up with your doctor.  HOME CARE  If you were prescribed an antibiotic medicine, finish it all even if you start to feel better.  Avoid any activities that cause chest pain.  Do not use any tobacco products, including cigarettes, chewing tobacco, or electronic cigarettes. If you need help quitting, ask your doctor.  Do not drink alcohol.  Take medicines only as told by your doctor.  Keep all follow-up visits as told by your doctor. This is important. This includes any further testing if your chest pain does not go away.  Your doctor may tell you to keep your head raised (elevated) while you sleep.  Make lifestyle changes as told by your doctor. These may include:  Getting regular exercise. Ask your doctor to suggest some activities that are safe for you.  Eating a heart-healthy diet. Your doctor or a diet specialist (dietitian) can help you to learn healthy eating options.  Maintaining a healthy weight.  Managing diabetes, if necessary.  Reducing stress. GET HELP IF:  Your chest pain does not go away, even after treatment.  You have a rash with blisters on your chest.  You have a fever. GET HELP RIGHT AWAY IF:  Your chest pain is worse.  You have an increasing cough, or  you cough up blood.  You have severe belly (abdominal) pain.  You feel extremely weak.  You pass out (faint).  You have chills.  You have sudden, unexplained chest discomfort.  You have sudden, unexplained discomfort in your arms, back, neck, or jaw.  You have shortness of breath at any time.  You suddenly start to sweat, or your skin gets clammy.  You feel nauseous.  You vomit.  You suddenly feel light-headed or dizzy.  Your heart begins to beat quickly, or it feels like it is skipping beats. These symptoms may be an emergency. Do not wait to see if the symptoms will go away. Get medical help right away. Call your local emergency services (911 in the [place] Do not drive yourself to the hospital.   This information is not intended to replace advice given to you by your health care provider. Make sure you discuss any questions you have with your health care provider.   Document Released: 06/02/2008 Document Revised: 01/05/2015 Document Reviewed: 07/21/2014 Elsevier Interactive Patient Education [employer]

## 2016-02-20 ENCOUNTER — Encounter: Payer: Self-pay | Admitting: Family Medicine

## 2016-02-20 ENCOUNTER — Ambulatory Visit (INDEPENDENT_AMBULATORY_CARE_PROVIDER_SITE_OTHER): Payer: BLUE CROSS/BLUE SHIELD

## 2016-02-20 ENCOUNTER — Ambulatory Visit (INDEPENDENT_AMBULATORY_CARE_PROVIDER_SITE_OTHER): Payer: BLUE CROSS/BLUE SHIELD | Admitting: Family Medicine

## 2016-02-20 VITALS — BP 110/68 | HR 70 | Temp 98.5°F | Resp 14 | Ht 59.0 in | Wt 112.0 lb

## 2016-02-20 DIAGNOSIS — R079 Chest pain, unspecified: Secondary | ICD-10-CM

## 2016-02-20 DIAGNOSIS — R0602 Shortness of breath: Secondary | ICD-10-CM | POA: Diagnosis not present

## 2016-02-20 DIAGNOSIS — Z720 Tobacco use: Secondary | ICD-10-CM

## 2016-02-20 DIAGNOSIS — F17200 Nicotine dependence, unspecified, uncomplicated: Secondary | ICD-10-CM

## 2016-02-20 DIAGNOSIS — I10 Essential (primary) hypertension: Secondary | ICD-10-CM | POA: Diagnosis not present

## 2016-02-20 NOTE — Progress Notes (Signed)
Patient ID: Toni Mcdonald, female   DOB: [birth year], 55 y.o.   MRN: [id number]    Subjective:    Patient ID: Toni Mcdonald, female    DOB: 20-Mar-1961, 55 y.o.   MRN: [id number]  Patient presents for ER F/U H here for ER follow-up. She was seen in the emergency room on Monday with chest pain. She had chest pain substernal radiating with diaphoresis and nausea associated. She had troponins which were negative EKG which only showed a couple of PVCs. Her blood pressure is normal. She's been said to have a stress test on March 1. She states that she still does not feel right with minimal exertion she gets extremely fatigued and she still get short of breath. She is a smoker her chest x-ray was normal. She is concerned about working with her current symptoms as it is very physical job. 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    Review Of Systems:  GEN- + fatigue, fever, weight loss,weakness, recent illness HEENT- denies eye drainage, change in vision, nasal discharge, CVS- +chest pain, palpitations RESP-+ SOB, cough, wheeze ABD- denies N/V, change in stools, abd pain GU- denies dysuria, hematuria, dribbling, incontinence MSK- denies joint pain, muscle aches, injury Neuro- denies headache, dizziness, syncope, seizure activity       Objective:    BP 110/68 mmHg  Pulse 70  Temp(Src) 98.5 F (36.9 C) (Oral)  Resp 14  Ht  (1.499 m)  Wt 112 lb (50.803 kg)  BMI 22.61 kg/m2 GEN- NAD, alert and oriented x3 HEENT- PERRL, EOMI, non injected sclera, pink conjunctiva, MMM, oropharynx clear CVS- RRR, no murmur RESP-CTAB ABD-NABS,soft,NT,ND EXT- No edema Pulses- Radial, DP- 2+  EKG-NSR , no ST changes       Assessment & Plan:      Problem List Items Addressed This Visit    None    Visit Diagnoses    Chest pain, unspecified chest pain type    -  Primary    Unclear cuase, risk factors of CAD, family history , smoking, HTN. Holter monitor placed, with the PVC rule out other arrythmia, reviewed labs. Will have stress test as scheduled for next week. She actually describes classic anginal symptoms. I'm concerned with her little exertion and getting short of breath and having symptoms with her working right now therefore was taken out of work until she has had her stress test. This is not sound gastrointestinal she has had a lot of stress with work today is a possibility this is contributing but I'm not truly convinced that this is all from stress. Her cardiologist will be Dr. Kirke Corin     Relevant Orders    EKG 12-Lead (Completed)       Note: This dictation was prepared with Dragon dictation along with smaller phrase technology. Any transcriptional errors that result from this process are unintentional.

## 2016-02-20 NOTE — Patient Instructions (Signed)
Continue current medications Return the heart monitor  Proceed with stress test as scheduled  F/U pending results

## 2016-02-20 NOTE — Assessment & Plan Note (Signed)
Well controlled 

## 2016-02-20 NOTE — Assessment & Plan Note (Signed)
Continue to work on cessation

## 2016-02-27 ENCOUNTER — Ambulatory Visit (INDEPENDENT_AMBULATORY_CARE_PROVIDER_SITE_OTHER): Payer: BLUE CROSS/BLUE SHIELD

## 2016-02-27 DIAGNOSIS — R079 Chest pain, unspecified: Secondary | ICD-10-CM | POA: Diagnosis not present

## 2016-02-28 LAB — EXERCISE TOLERANCE TEST
CHL CUP MPHR: 168 {beats}/min
CHL CUP RESTING HR STRESS: 94 {beats}/min
CHL CUP STRESS STAGE 1 HR: 97 {beats}/min
CHL CUP STRESS STAGE 1 SBP: 121 mmHg
CHL CUP STRESS STAGE 1 SPEED: 0 mph
CHL CUP STRESS STAGE 2 GRADE: 0 %
CHL CUP STRESS STAGE 2 SPEED: 0 mph
CHL CUP STRESS STAGE 3 DBP: 78 mmHg
CHL CUP STRESS STAGE 3 GRADE: 10 %
CHL CUP STRESS STAGE 3 SBP: 137 mmHg
CHL CUP STRESS STAGE 4 SBP: 140 mmHg
CHL CUP STRESS STAGE 5 GRADE: 14 %
CHL CUP STRESS STAGE 5 HR: 141 {beats}/min
CHL CUP STRESS STAGE 6 HR: 99 {beats}/min
CHL CUP STRESS STAGE 7 DBP: 78 mmHg
CHL CUP STRESS STAGE 7 GRADE: 0 %
CHL CUP STRESS STAGE 7 HR: 100 {beats}/min
CHL CUP STRESS STAGE 7 SBP: 125 mmHg
CHL CUP STRESS STAGE 7 SPEED: 0 mph
CSEPED: 7 min
CSEPEDS: 1 s
CSEPEW: 8.5 METS
CSEPPHR: 141 {beats}/min
Percent HR: 84 %
Percent of predicted max HR: 83 %
Stage 1 DBP: 82 mmHg
Stage 1 Grade: 0 %
Stage 2 HR: 97 {beats}/min
Stage 3 HR: 110 {beats}/min
Stage 3 Speed: 1.7 mph
Stage 4 DBP: 76 mmHg
Stage 4 Grade: 12 %
Stage 4 HR: 130 {beats}/min
Stage 4 Speed: 2.5 mph
Stage 5 Speed: 3.4 mph
Stage 6 Grade: 0 %
Stage 6 Speed: 0 mph

## 2016-02-29 ENCOUNTER — Telehealth: Payer: Self-pay | Admitting: Family Medicine

## 2016-02-29 NOTE — Telephone Encounter (Signed)
Message sent to me. Will forward to Dr. Jeanice Limurham .

## 2016-02-29 NOTE — Telephone Encounter (Signed)
Call patient her heart monitor showed that she has been having PVCs she also had some T-wave changes which is non specific but can be seen with people with heart disease and blockage. She definitely  Needs stress test and to follow with cardiology. I'm not can start her on any new medications until they see her. Please fax her Holter monitor results to Dr. Kirke CorinArida at the cardiology office located in [place]

## 2016-02-29 NOTE — Telephone Encounter (Signed)
Call placed to patient and patient made aware.   Patient states that she had stress test on 02/26/2016, but has not heard from cards in regards to results.   Call placed to [facility] to inquire.   Results have not been reviewed by MD in entirety, but she should be fine to return to work on Monday. States that results should be completed by Monday as well.   Call placed to patient and patient made aware.

## 2016-03-03 ENCOUNTER — Other Ambulatory Visit: Payer: Self-pay | Admitting: *Deleted

## 2016-03-03 DIAGNOSIS — R0602 Shortness of breath: Secondary | ICD-10-CM

## 2016-03-03 DIAGNOSIS — R079 Chest pain, unspecified: Secondary | ICD-10-CM

## 2016-03-21 ENCOUNTER — Encounter: Payer: Self-pay | Admitting: Family Medicine

## 2016-04-11 ENCOUNTER — Ambulatory Visit (INDEPENDENT_AMBULATORY_CARE_PROVIDER_SITE_OTHER): Payer: BLUE CROSS/BLUE SHIELD | Admitting: Cardiovascular Disease

## 2016-04-11 ENCOUNTER — Encounter: Payer: Self-pay | Admitting: Cardiovascular Disease

## 2016-04-11 ENCOUNTER — Encounter (INDEPENDENT_AMBULATORY_CARE_PROVIDER_SITE_OTHER): Payer: Self-pay

## 2016-04-11 VITALS — BP 110/84 | HR 76 | Ht 60.0 in | Wt 114.2 lb

## 2016-04-11 DIAGNOSIS — I493 Ventricular premature depolarization: Secondary | ICD-10-CM | POA: Diagnosis not present

## 2016-04-11 DIAGNOSIS — R0789 Other chest pain: Secondary | ICD-10-CM | POA: Diagnosis not present

## 2016-04-11 DIAGNOSIS — R079 Chest pain, unspecified: Secondary | ICD-10-CM | POA: Insufficient documentation

## 2016-04-11 NOTE — Patient Instructions (Signed)
Medication Instructions: No changes.   Labwork: None.   Procedures/Testing: None.   Follow-Up: With Dr. Kirke CorinArida as needed.   Any Additional Special Instructions Will Be Listed Below (If Applicable).     If you need a refill on your cardiac medications before your next appointment, please call your pharmacy.

## 2016-04-11 NOTE — Progress Notes (Signed)
Cardiology Office Note   Date:  04/11/2016   ID:  Toni Mcdonald, DOB May 21, 1961, MRN [id number]  PCP:  Frazier Richards, PA-C  Cardiologist:   Lorine Bears, MD   Chief Complaint  Patient presents with  . other    No complaints today is feeling well since cutting back hours at work and stress level. Meds reviewed verbally with pt.      History of Present Illness: Toni Mcdonald is a 55 y.o. female who Was referred by Dr. Jeanice Lim for evaluation of chest pain and palpitations. The patient has no previous cardiac history. She has known history of hypertension, tobacco use and strong family history of coronary artery disease. In February, she had intermittent episodes of chest pain described as sharp discomfort substernally with no radiation. The pain happen mostly while she was at work both at rest and with activities. She had some mild increase of exertional dyspnea at that time. She reports being under significant stress at that time as she was working 12-15 hours every day. She underwent a treadmill stress test in our office which showed no evidence of ischemia. She complained of palpitations and underwent a 24-hour Holter monitor which showed 800 PVCs. The patient cut down on her work and also cut down on caffeine intake with subsequent resolution of all symptoms.    Past Medical History  Diagnosis Date  . Hypertension     controlled by diet and exercise  . Anxiety   . Pneumonia     hx of  . Bronchitis     hx of  . GERD (gastroesophageal reflux disease)   . Headache(784.0)     migraines  . Arthritis   . Chronic neck pain     Past Surgical History  Procedure Laterality Date  . Cardiovascular stress test      2008  . Tubal ligation    . Diagnostic laparoscopy      exploratory surgery due to "scarring"  . Anterior cervical decomp/discectomy fusion  09/22/2012    Procedure: ANTERIOR CERVICAL DECOMPRESSION/DISCECTOMY FUSION 3 LEVELS;  Surgeon: Tia Alert, MD;   Location: [facility];  Service: Neurosurgery;  Laterality: N/A;  Anterior Cervical Diskectomy/Fusion with Plate, Cervical four through seven  . Abdominal hysterectomy      Has both ovaries     Current Outpatient Prescriptions  Medication Sig Dispense Refill  . amLODipine (NORVASC) 5 MG tablet Take 1 tablet (5 mg total) by mouth daily. 30 tablet 3  . ibuprofen (ADVIL,MOTRIN) 400 MG tablet Take 400 mg by mouth every 6 (six) hours as needed.    . Lactobacillus-Inulin (CULTURELLE DIGESTIVE HEALTH) CAPS Take 1 capsule by mouth daily.    Marland Kitchen Propylene Glycol (SYSTANE BALANCE) 0.6 % SOLN Place 2 drops into both eyes daily.    . sodium chloride (OCEAN) 0.65 % SOLN nasal spray Place 1 spray into the nose as needed. For dry nose    . vitamin B-12 (CYANOCOBALAMIN) 1000 MCG tablet Take 1,000 mcg by mouth daily.     No current facility-administered medications for this visit.    Allergies:   Penicillins; Azithromycin; Codeine; Neosporin; Percocet; and Wellbutrin    Social History:  The patient  reports that she has been smoking Cigarettes.  She has a 35 pack-year smoking history. She has never used smokeless tobacco. She reports that she drinks about 2.4 oz of alcohol per week. She reports that she does not use illicit drugs.   Family History:  The patient's  family history includes Arthritis in her mother; Early death in her brother; Heart attack in her brother; Heart disease in her father and mother; Heart disease (age of onset: 6550) in her brother; Hyperlipidemia in her father and mother; Hypertension in her father and mother; Kidney disease in her father; Stroke in her father and mother; Thyroid disease in her sister, sister, and sister; Vision loss in her maternal grandfather.    ROS:  Please see the history of present illness.   Otherwise, review of systems are positive for none.   All other systems are reviewed and negative.    PHYSICAL EXAM: VS:  BP 110/84 mmHg  Pulse 76  Ht 5' (1.524 m)   Wt 114 lb 4 oz (51.823 kg)  BMI 22.31 kg/m2 , BMI Body mass index is 22.31 kg/(m^2). GEN: Well nourished, well developed, in no acute distress HEENT: normal Neck: no JVD, carotid bruits, or masses Cardiac: RRR; no murmurs, rubs, or gallops,no edema  Respiratory:  clear to auscultation bilaterally, normal work of breathing GI: soft, nontender, nondistended, + BS MS: no deformity or atrophy Skin: warm and dry, no rash Neuro:  Strength and sensation are intact Psych: euthymic mood, full affect   EKG:  EKG is not ordered today.    Recent Labs: 08/15/2015: ALT 12; TSH 0.561 02/18/2016: BUN 10; Creatinine, Ser 0.75; Hemoglobin 14.0; Platelets 409; Potassium 4.2; Sodium 137    Lipid Panel    Component Value Date/Time   CHOL 185 08/15/2015 0924   TRIG 91 08/15/2015 0924   HDL 55 08/15/2015 0924   CHOLHDL 3.4 08/15/2015 0924   VLDL 18 08/15/2015 0924   LDLCALC 112 08/15/2015 0924      Wt Readings from Last 3 Encounters:  04/11/16 114 lb 4 oz (51.823 kg)  02/20/16 112 lb (50.803 kg)  02/18/16 112 lb (50.803 kg)       ASSESSMENT AND PLAN:  1.  Atypical chest pain: Likely was due to stress and anxiety. Her symptoms resolved completely. Recent treadmill stress test was normal. I discussed with the patient the importance of lifestyle changes in order to decrease the chance of future coronary artery disease and cardiovascular events. We discussed the importance of controlling risk factors, healthy diet as well as regular exercise. I also explained to him that a normal stress test does not rule out atherosclerosis.   2. PVCs: These also improved significantly . She continues to consume large amount of caffeine and I advised her to cut down.  3. Tobacco use: I had a prolonged discussion with her about the importance of smoking cessation especially with her strong family history of coronary artery disease.     Disposition:   FU with me as needed  Signed,  Lorine BearsMuhammad Modena Bellemare, MD    04/11/2016 6:06 PM    [facility]

## 2016-04-29 LAB — HM MAMMOGRAPHY: HM Mammogram: NORMAL (ref 0–4)

## 2016-06-13 ENCOUNTER — Other Ambulatory Visit: Payer: Self-pay | Admitting: Family Medicine

## 2016-06-17 ENCOUNTER — Telehealth: Payer: Self-pay | Admitting: Physician Assistant

## 2016-06-17 ENCOUNTER — Encounter: Payer: Self-pay | Admitting: Family Medicine

## 2016-06-17 ENCOUNTER — Ambulatory Visit (INDEPENDENT_AMBULATORY_CARE_PROVIDER_SITE_OTHER): Payer: BLUE CROSS/BLUE SHIELD | Admitting: Family Medicine

## 2016-06-17 VITALS — BP 122/60 | HR 76 | Temp 98.5°F | Resp 12 | Ht 60.0 in | Wt 116.0 lb

## 2016-06-17 DIAGNOSIS — I781 Nevus, non-neoplastic: Secondary | ICD-10-CM

## 2016-06-17 DIAGNOSIS — R233 Spontaneous ecchymoses: Secondary | ICD-10-CM

## 2016-06-17 DIAGNOSIS — Z1211 Encounter for screening for malignant neoplasm of colon: Secondary | ICD-10-CM

## 2016-06-17 DIAGNOSIS — M79669 Pain in unspecified lower leg: Secondary | ICD-10-CM

## 2016-06-17 MED ORDER — AMLODIPINE BESYLATE 5 MG PO TABS: 5.0000 mg | ORAL_TABLET | Freq: Every day | ORAL | Status: DC

## 2016-06-17 NOTE — Progress Notes (Signed)
Patient ID: Toni Mcdonald, female   DOB: 08/27/1961, 55 y.o.   MRN: [id number]    Subjective:    Patient ID: Toni BarbaraBelinda E Mcclune, female    DOB: 09/24/1961, 55 y.o.   MRN: [id number]  Patient presents for Frequent Bruising  She here with bruising on bilateral calves right greater than left. She states her daughter noticed that over the weekend she then had some mild discomfort in the area of the bruising behind her legs yesterday. She is not memory particular injuries. She does have a lot of spider veins occasionally they break. She's not had any travel. She does walk on concrete floor for her job many hours a day and gets very tired feet. She does have some compression has which she wears during the winter and fall. He was concerned as she had some elevated platelets on her last set of labs drawn by her cardiologist   Review Of Systems:  GEN- denies fatigue, fever, weight loss,weakness, recent illness HEENT- denies eye drainage, change in vision, nasal discharge, CVS- denies chest pain, palpitations RESP- denies SOB, cough, wheeze ABD- denies N/V, change in stools, abd pain GU- denies dysuria, hematuria, dribbling, incontinence MSK- denies joint pain, muscle aches, injury Neuro- denies headache, dizziness, syncope, seizure activity       Objective:    BP 122/60 mmHg  Pulse 76  Temp(Src) 98.5 F (36.9 C) (Oral)  Resp 12  Ht 5' (1.524 m)  Wt 116 lb (52.617 kg)  BMI 22.65 kg/m2 GEN- NAD, alert and oriented x3 CVS- RRR, no murmur RESP-CTAB EXT- No edema, multiple spider veins, broken veins on post aspect both legs/right calf, no hematoma or cords palpated, neg homans  Pulses- Radial, DP- 2+        Assessment & Plan:      Problem List Items Addressed This Visit    None    Visit Diagnoses    Spontaneous bruising    -  Primary    I think this is due to broken spider veins, will check plt, pt/inr doubt true bleeding disorder or clotting problem. D dimer if positive US  to be done    Relevant Orders    Protime-INR    CBC    D-dimer, quantitative (not at [facility])    Calf pain, unspecified laterality        Relevant Orders    D-dimer, quantitative (not at [facility])    Spider veins        Relevant Medications    amLODipine (NORVASC) 5 MG tablet       Note: This dictation was prepared with Dragon dictation along with smaller phrase technology. Any transcriptional errors that result from this process are unintentional.

## 2016-06-17 NOTE — Telephone Encounter (Signed)
Pt is due for her colonoscopy and she would like for us to put in a referral to see Dr. Anthoney HaradaSeagal in [place]

## 2016-06-17 NOTE — Patient Instructions (Addendum)
We will call with with lab results Use compression hose/socks  F/U Physical in August

## 2016-06-18 LAB — CBC
HCT: 39.7 % (ref 35.0–45.0)
HEMOGLOBIN: 13.2 g/dL (ref 12.0–15.0)
MCH: 28.8 pg (ref 27.0–33.0)
MCHC: 33.2 g/dL (ref 32.0–36.0)
MCV: 86.7 fL (ref 80.0–100.0)
MPV: 9.8 fL (ref 7.5–12.5)
PLATELETS: 406 10*3/uL — AB (ref 140–400)
RBC: 4.58 MIL/uL (ref 3.80–5.10)
RDW: 14 % (ref 11.0–15.0)
WBC: 7.9 10*3/uL (ref 3.8–10.8)

## 2016-06-18 LAB — D-DIMER, QUANTITATIVE: D-Dimer, Quant: 0.24 mcg/mL FEU (ref <0.50)

## 2016-06-18 LAB — PROTIME-INR
INR: 1
PROTHROMBIN TIME: 10.9 s (ref 9.0–11.5)

## 2016-06-19 NOTE — Telephone Encounter (Signed)
Put in referral for colon[place] to [place] in [place] or [place].

## 2016-06-19 NOTE — Telephone Encounter (Signed)
Approved. Had CPE with Dr. Jeanice Limurham 2016.

## 2016-06-20 ENCOUNTER — Other Ambulatory Visit: Payer: Self-pay | Admitting: *Deleted

## 2016-06-20 ENCOUNTER — Telehealth: Payer: Self-pay | Admitting: Gastroenterology

## 2016-06-20 MED ORDER — AMLODIPINE BESYLATE 5 MG PO TABS
5.0000 mg | ORAL_TABLET | Freq: Every day | ORAL | Status: DC
Start: 2016-06-20 — End: 2017-02-06

## 2016-06-20 NOTE — Telephone Encounter (Signed)
Received fax requesting refill on Norvasc.   Refill appropriate and filled per protocol.  

## 2016-06-20 NOTE — Telephone Encounter (Signed)
LVM for pt to return my call.

## 2016-06-20 NOTE — Telephone Encounter (Signed)
Please call for colonoscopy screening. I will scan patients records/referral [facility] into her chart and [facility]. She has been referred for colonoscopy.

## 2016-06-23 NOTE — Telephone Encounter (Signed)
Patient returned your call regarding a colonoscopy °

## 2016-06-25 ENCOUNTER — Other Ambulatory Visit: Payer: Self-pay

## 2016-06-25 NOTE — Telephone Encounter (Signed)
Pt scheduled for screening colonoscopy on 07/10/16. Please precert.

## 2016-06-25 NOTE — Telephone Encounter (Signed)
Gastroenterology Pre-Procedure Review  Request Date: 07/10/16 Requesting Physician: Dr.   PATIENT REVIEW QUESTIONS: The patient responded to the following health history questions as indicated:    1. Are you having any GI issues? no 2. Do you have a personal history of Polyps? no 3. Do you have a family history of Colon Cancer or Polyps? no 4. Diabetes Mellitus? no 5. Joint replacements in the past 12 months?no 6. Major health problems in the past 3 months?no 7. Any artificial heart valves, MVP, or defibrillator?no    MEDICATIONS & ALLERGIES:    Patient reports the following regarding taking any anticoagulation/antiplatelet therapy:   Plavix, Coumadin, Eliquis, Xarelto, Lovenox, Pradaxa, Brilinta, or Effient? no Aspirin? no  Patient confirms/reports the following medications:  Current Outpatient Prescriptions  Medication Sig Dispense Refill  . amLODipine (NORVASC) 5 MG tablet Take 1 tablet (5 mg total) by mouth daily. 30 tablet 3  . ibuprofen (ADVIL,MOTRIN) 400 MG tablet Take 400 mg by mouth every 6 (six) hours as needed.    . Lactobacillus-Inulin (CULTURELLE DIGESTIVE HEALTH) CAPS Take 1 capsule by mouth daily.    Marland Kitchen. Propylene Glycol (SYSTANE BALANCE) 0.6 % SOLN Place 2 drops into both eyes daily.    . sodium chloride (OCEAN) 0.65 % SOLN nasal spray Place 1 spray into the nose as needed. For dry nose    . vitamin B-12 (CYANOCOBALAMIN) 1000 MCG tablet Take 1,000 mcg by mouth daily.     No current facility-administered medications for this visit.    Patient confirms/reports the following allergies:  Allergies  Allergen Reactions  . Penicillins Anaphylaxis    "whelps"  . Azithromycin     GI Upset   . Codeine Itching and Swelling    "skin crawls'  . Neosporin [Neomycin-Bacitracin Zn-Polymyx]     "blisters the skin"  . Percocet [Oxycodone-Acetaminophen]     Nausea and vomiting  . Wellbutrin [Bupropion] Hives    No orders of the defined types were placed in this  encounter.    AUTHORIZATION INFORMATION Primary Insurance: 1D#: Group #:  Secondary Insurance: 1D#: Group #:  SCHEDULE INFORMATION: Date: 07/10/16 Time: Location: [facility]

## 2016-07-03 ENCOUNTER — Encounter: Payer: Self-pay | Admitting: *Deleted

## 2016-07-09 NOTE — Discharge Instructions (Signed)

## 2016-07-10 ENCOUNTER — Encounter
Admission: RE | Disposition: A | Discharge status: Home / Self Care | Payer: Self-pay | Source: Ambulatory Visit | Attending: Gastroenterology

## 2016-07-10 ENCOUNTER — Ambulatory Visit: Payer: BLUE CROSS/BLUE SHIELD | Admitting: Anesthesiology

## 2016-07-10 ENCOUNTER — Ambulatory Visit
Admission: RE | Admit: 2016-07-10 | Discharge: 2016-07-10 | Disposition: A | Discharge status: Home / Self Care | Payer: BLUE CROSS/BLUE SHIELD | Source: Ambulatory Visit | Attending: Gastroenterology | Admitting: Gastroenterology

## 2016-07-10 DIAGNOSIS — Z841 Family history of disorders of kidney and ureter: Secondary | ICD-10-CM | POA: Diagnosis not present

## 2016-07-10 DIAGNOSIS — Z888 Allergy status to other drugs, medicaments and biological substances status: Secondary | ICD-10-CM | POA: Insufficient documentation

## 2016-07-10 DIAGNOSIS — M1990 Unspecified osteoarthritis, unspecified site: Secondary | ICD-10-CM | POA: Diagnosis not present

## 2016-07-10 DIAGNOSIS — F419 Anxiety disorder, unspecified: Secondary | ICD-10-CM | POA: Diagnosis not present

## 2016-07-10 DIAGNOSIS — Z881 Allergy status to other antibiotic agents status: Secondary | ICD-10-CM | POA: Insufficient documentation

## 2016-07-10 DIAGNOSIS — K219 Gastro-esophageal reflux disease without esophagitis: Secondary | ICD-10-CM | POA: Insufficient documentation

## 2016-07-10 DIAGNOSIS — Z8349 Family history of other endocrine, nutritional and metabolic diseases: Secondary | ICD-10-CM | POA: Diagnosis not present

## 2016-07-10 DIAGNOSIS — Z79899 Other long term (current) drug therapy: Secondary | ICD-10-CM | POA: Diagnosis not present

## 2016-07-10 DIAGNOSIS — Z8249 Family history of ischemic heart disease and other diseases of the circulatory system: Secondary | ICD-10-CM | POA: Insufficient documentation

## 2016-07-10 DIAGNOSIS — Z821 Family history of blindness and visual loss: Secondary | ICD-10-CM | POA: Insufficient documentation

## 2016-07-10 DIAGNOSIS — Z885 Allergy status to narcotic agent status: Secondary | ICD-10-CM | POA: Insufficient documentation

## 2016-07-10 DIAGNOSIS — Z823 Family history of stroke: Secondary | ICD-10-CM | POA: Diagnosis not present

## 2016-07-10 DIAGNOSIS — Z9851 Tubal ligation status: Secondary | ICD-10-CM | POA: Diagnosis not present

## 2016-07-10 DIAGNOSIS — Z791 Long term (current) use of non-steroidal anti-inflammatories (NSAID): Secondary | ICD-10-CM | POA: Insufficient documentation

## 2016-07-10 DIAGNOSIS — I10 Essential (primary) hypertension: Secondary | ICD-10-CM | POA: Diagnosis not present

## 2016-07-10 DIAGNOSIS — K641 Second degree hemorrhoids: Secondary | ICD-10-CM | POA: Diagnosis not present

## 2016-07-10 DIAGNOSIS — Z90710 Acquired absence of both cervix and uterus: Secondary | ICD-10-CM | POA: Insufficient documentation

## 2016-07-10 DIAGNOSIS — Z1211 Encounter for screening for malignant neoplasm of colon: Secondary | ICD-10-CM | POA: Diagnosis not present

## 2016-07-10 DIAGNOSIS — F17210 Nicotine dependence, cigarettes, uncomplicated: Secondary | ICD-10-CM | POA: Diagnosis not present

## 2016-07-10 DIAGNOSIS — Z981 Arthrodesis status: Secondary | ICD-10-CM | POA: Insufficient documentation

## 2016-07-10 DIAGNOSIS — Z8261 Family history of arthritis: Secondary | ICD-10-CM | POA: Diagnosis not present

## 2016-07-10 DIAGNOSIS — Z880 Allergy status to penicillin: Secondary | ICD-10-CM | POA: Diagnosis not present

## 2016-07-10 HISTORY — PX: COLONOSCOPY WITH PROPOFOL: SHX5780

## 2016-07-10 SURGERY — COLONOSCOPY WITH PROPOFOL
Anesthesia: Monitor Anesthesia Care | Wound class: Contaminated

## 2016-07-10 MED ORDER — ONDANSETRON HCL 4 MG/2ML IJ SOLN: 4.0000 mg | Freq: Once | INTRAMUSCULAR | Status: DC | PRN

## 2016-07-10 MED ORDER — ACETAMINOPHEN 160 MG/5ML PO SOLN: 325.0000 mg | ORAL | Status: DC | PRN

## 2016-07-10 MED ORDER — LIDOCAINE HCL (CARDIAC) 20 MG/ML IV SOLN
INTRAVENOUS | Status: DC | PRN
  Administered 2016-07-10: 50 mg via INTRAVENOUS

## 2016-07-10 MED ORDER — PROPOFOL 10 MG/ML IV BOLUS
INTRAVENOUS | Status: DC | PRN
  Administered 2016-07-10: 20 mg via INTRAVENOUS
  Administered 2016-07-10: 10 mg via INTRAVENOUS
  Administered 2016-07-10 (×2): 20 mg via INTRAVENOUS
  Administered 2016-07-10: 70 mg via INTRAVENOUS
  Administered 2016-07-10 (×2): 20 mg via INTRAVENOUS

## 2016-07-10 MED ORDER — LACTATED RINGERS IV SOLN
INTRAVENOUS | Status: DC
  Administered 2016-07-10: 09:00:00 via INTRAVENOUS

## 2016-07-10 MED ORDER — STERILE WATER FOR IRRIGATION IR SOLN
Status: DC | PRN
  Administered 2016-07-10: 10:00:00

## 2016-07-10 MED ORDER — ACETAMINOPHEN 325 MG PO TABS: 325.0000 mg | ORAL_TABLET | ORAL | Status: DC | PRN

## 2016-07-10 SURGICAL SUPPLY — 23 items
CANISTER SUCT 1200ML W/VALVE (MISCELLANEOUS) ×3 IMPLANT
CLIP HMST 235XBRD CATH ROT (MISCELLANEOUS) IMPLANT
CLIP RESOLUTION 360 11X235 (MISCELLANEOUS)
FCP ESCP3.2XJMB 240X2.8X (MISCELLANEOUS)
FORCEPS BIOP RAD 4 LRG CAP 4 (CUTTING FORCEPS) IMPLANT
FORCEPS BIOP RJ4 240 W/NDL (MISCELLANEOUS)
FORCEPS ESCP3.2XJMB 240X2.8X (MISCELLANEOUS) IMPLANT
GOWN CVR UNV OPN BCK APRN NK (MISCELLANEOUS) ×2 IMPLANT
GOWN ISOL THUMB LOOP REG UNIV (MISCELLANEOUS) ×4
INJECTOR VARIJECT VIN23 (MISCELLANEOUS) IMPLANT
KIT DEFENDO VALVE AND CONN (KITS) IMPLANT
KIT ENDO PROCEDURE OLY (KITS) ×3 IMPLANT
MARKER SPOT ENDO TATTOO 5ML (MISCELLANEOUS) IMPLANT
PAD GROUND ADULT SPLIT (MISCELLANEOUS) IMPLANT
PROBE APC STR FIRE (PROBE) IMPLANT
RETRIEVER NET ROTH 2.5X230 LF (MISCELLANEOUS) ×3 IMPLANT
SNARE SHORT THROW 13M SML OVAL (MISCELLANEOUS) IMPLANT
SNARE SHORT THROW 30M LRG OVAL (MISCELLANEOUS) IMPLANT
SNARE SNG USE RND 15MM (INSTRUMENTS) IMPLANT
SPOT EX ENDOSCOPIC TATTOO (MISCELLANEOUS)
TRAP ETRAP POLY (MISCELLANEOUS) IMPLANT
VARIJECT INJECTOR VIN23 (MISCELLANEOUS)
WATER STERILE IRR 250ML POUR (IV SOLUTION) ×3 IMPLANT

## 2016-07-10 NOTE — Anesthesia Postprocedure Evaluation (Signed)
Anesthesia Post Note  Patient: Toni Mcdonald  Procedure(s) Performed: Procedure(s) (LRB): COLONOSCOPY WITH PROPOFOL (N/A)  Patient location during evaluation: PACU Anesthesia Type: MAC Level of consciousness: awake and alert and oriented Pain management: pain level controlled Vital Signs Assessment: post-procedure vital signs reviewed and stable Respiratory status: spontaneous breathing and nonlabored ventilation Cardiovascular status: stable Postop Assessment: no signs of nausea or vomiting and adequate PO intake Anesthetic complications: no    Harolyn RutherfordJoshua Ibeth Fahmy

## 2016-07-10 NOTE — Anesthesia Procedure Notes (Signed)
Procedure Name: MAC Performed by: Roque Schill Pre-anesthesia Checklist: Patient identified, Emergency Drugs available, Suction available, Timeout performed and Patient being monitored Patient Re-evaluated:Patient Re-evaluated prior to inductionOxygen Delivery Method: Nasal cannula Placement Confirmation: positive ETCO2       

## 2016-07-10 NOTE — H&P (Signed)
Toni Miniumarren Elzie Knisley, MD [facility] [address] [place], [place] [address] Phone: [phone] Fax : [phone]  Primary Care Physician:  Frazier RichardsIXON,MARY BETH, PA-C Primary Gastroenterologist:  Dr. Servando SnareWohl  Pre-Procedure History & Physical: HPI:  Toni BarbaraBelinda E Mcdonald is a 55 y.o. female is here for a screening colonoscopy.   Past Medical History  Diagnosis Date  . Hypertension     controlled by diet and exercise  . Anxiety   . Pneumonia     hx of  . Bronchitis     hx of  . GERD (gastroesophageal reflux disease)   . Headache(784.0)     migraines  . Arthritis   . Chronic neck pain     Past Surgical History  Procedure Laterality Date  . Cardiovascular stress test      2008  . Tubal ligation    . Diagnostic laparoscopy      exploratory surgery due to "scarring"  . Anterior cervical decomp/discectomy fusion  09/22/2012    Procedure: ANTERIOR CERVICAL DECOMPRESSION/DISCECTOMY FUSION 3 LEVELS;  Surgeon: Tia Alertavid S Jones, MD;  Location: [facility];  Service: Neurosurgery;  Laterality: N/A;  Anterior Cervical Diskectomy/Fusion with Plate, Cervical four through seven  . Abdominal hysterectomy      Has both ovaries    Prior to Admission medications   Medication Sig Start Date End Date Taking? Authorizing Provider  amLODipine (NORVASC) 5 MG tablet Take 1 tablet (5 mg total) by mouth daily. 06/20/16  Yes Salley ScarletKawanta F [place], MD  ibuprofen (ADVIL,MOTRIN) 400 MG tablet Take 400 mg by mouth every 6 (six) hours as needed.   Yes Historical Provider, MD  Lactobacillus-Inulin (CULTURELLE DIGESTIVE HEALTH) CAPS Take 1 capsule by mouth daily.   Yes Historical Provider, MD  Propylene Glycol (SYSTANE BALANCE) 0.6 % SOLN Place 2 drops into both eyes daily.   Yes Historical Provider, MD  sodium chloride (OCEAN) 0.65 % SOLN nasal spray Place 1 spray into the nose as needed. For dry nose   Yes Historical Provider, MD  vitamin B-12 (CYANOCOBALAMIN) 1000 MCG tablet Take 1,000 mcg by mouth daily.   Yes Historical  Provider, MD    Allergies as of 06/25/2016 - Review Complete 06/17/2016  Allergen Reaction Noted  . Penicillins Anaphylaxis 09/13/2012  . Azithromycin  08/15/2015  . Codeine Itching and Swelling 09/13/2012  . Neosporin [neomycin-bacitracin zn-polymyx]  09/13/2012  . Percocet [oxycodone-acetaminophen]  09/15/2012  . Wellbutrin [bupropion] Hives 09/13/2012    Family History  Problem Relation Age of Onset  . Arthritis Mother   . Heart disease Mother   . Hyperlipidemia Mother   . Hypertension Mother   . Stroke Mother   . Heart disease Father   . Hyperlipidemia Father   . Hypertension Father   . Kidney disease Father   . Stroke Father   . Thyroid disease Sister   . Thyroid disease Sister   . Thyroid disease Sister   . Vision loss Maternal Grandfather   . Early death Brother   . Heart disease Brother [id number]    Died at 6353 with MI  . Heart attack Brother     Social History   Social History  . Marital Status: Married    Spouse Name: N/A  . Number of Children: N/A  . Years of Education: N/A   Occupational History  . Not on file.   Social History Main Topics  . Smoking status: Current Every Day Smoker -- 1.00 packs/day for 35 years    Types: Cigarettes  . Smokeless tobacco:  Never Used  . Alcohol Use: 2.4 oz/week    4 Glasses of wine per week     Comment: occasional  . Drug Use: No  . Sexual Activity: Yes   Other Topics Concern  . Not on file   Social History Narrative   Married.    2 children. 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 Neck:  Supple; no masses or thyromegaly. Lungs:  Clear  throughout to auscultation.    Heart:  Regular rate and rhythm. Abdomen:  Soft, nontender and nondistended. Normal bowel sounds, without guarding, and without rebound.   Neurologic:  Alert and  oriented x4;  grossly normal neurologically.  Impression/Plan: Toni Mcdonald is now here to undergo a screening colonoscopy.  Risks, benefits, and alternatives regarding colonoscopy have been reviewed with the patient.  Questions have been answered.  All parties agreeable.

## 2016-07-10 NOTE — Anesthesia Preprocedure Evaluation (Signed)
Anesthesia Evaluation  Patient identified by MRN, date of birth, ID band Patient awake    Reviewed: Allergy & Precautions, NPO status , Patient's Chart, lab work & pertinent test results  Airway Mallampati: I  TM Distance: >3 FB Neck ROM: Full    Dental no notable dental hx.    Pulmonary Current Smoker,    Pulmonary exam normal        Cardiovascular hypertension, Pt. on medications Normal cardiovascular exam     Neuro/Psych  Headaches, Anxiety    GI/Hepatic Neg liver ROS, GERD  Controlled,  Endo/Other  negative endocrine ROS  Renal/GU negative Renal ROS     Musculoskeletal  (+) Arthritis , Osteoarthritis,    Abdominal   Peds  Hematology negative hematology ROS (+)   Anesthesia Other Findings   Reproductive/Obstetrics                             Anesthesia Physical Anesthesia Plan  ASA: II  Anesthesia Plan: MAC   Post-op Pain Management:    Induction: Intravenous  Airway Management Planned:   Additional Equipment:   Intra-op Plan:   Post-operative Plan:   Informed Consent: I have reviewed the patients History and Physical, chart, labs and discussed the procedure including the risks, benefits and alternatives for the proposed anesthesia with the patient or authorized representative who has indicated his/her understanding and acceptance.     Plan Discussed with: CRNA  Anesthesia Plan Comments:         Anesthesia Quick Evaluation

## 2016-07-10 NOTE — Op Note (Signed)
[facility] Gastroenterology Patient Name: Toni MustyBelinda Mcdonald Procedure Date: 07/10/2016 9:41 AM MRN: [id number] Account #: [id number] Date of Birth: 11/14/1961 Admit Type: Outpatient Age: [age] [facility] 01 Gender: Female Note Status: Finalized Procedure:            Colonoscopy Indications:          Screening for colorectal malignant neoplasm Providers:            Midge Miniumarren Kahiau Schewe, MD Referring MD:         Frazier RichardsMary Beth Dixon, PA (Referring MD) Medicines:            Propofol per Anesthesia Complications:        No immediate complications. Procedure:            Pre-Anesthesia Assessment:                       - Prior to the procedure, a History and Physical was                        performed, and patient medications and allergies were                        reviewed. The patient's tolerance of previous                        anesthesia was also reviewed. The risks and benefits of                        the procedure and the sedation options and risks were                        discussed with the patient. All questions were                        answered, and informed consent was obtained. Prior                        Anticoagulants: The patient has taken no previous                        anticoagulant or antiplatelet agents. ASA Grade                        Assessment: II - A patient with mild systemic disease.                        After reviewing the risks and benefits, the patient was                        deemed in satisfactory condition to undergo the                        procedure.                       After obtaining informed consent, the colonoscope was                        passed under direct vision. Throughout the procedure,  the patient's blood pressure, pulse, and oxygen                        saturations were monitored continuously. The was                        introduced through the anus and advanced to the the                  cecum, identified by appendiceal orifice and ileocecal                        valve. The colonoscopy was performed without                        difficulty. The patient tolerated the procedure well.                        The quality of the bowel preparation was excellent. Findings:      The perianal and digital rectal examinations were normal.      Non-bleeding internal hemorrhoids were found during retroflexion. The       hemorrhoids were Grade II (internal hemorrhoids that prolapse but reduce       spontaneously).      The terminal ileum appeared normal. Impression:           - Non-bleeding internal hemorrhoids.                       - The examined portion of the ileum was normal.                       - No specimens collected. Recommendation:       - Repeat colonoscopy in 10 years for screening unless                        any change in family history or lower GI problems. Procedure Code(s):    --- Professional ---                       [phone], Colonoscopy, flexible; diagnostic, including                        collection of specimen(s) by brushing or washing, when                        performed (separate procedure) Diagnosis Code(s):    --- Professional ---                       Z12.11, Encounter for screening for malignant neoplasm                        of colon CPT copyright 2016 American Medical Association. All rights reserved. The codes documented in this report are preliminary and upon coder review may  be revised to meet current compliance requirements. Midge Minium MD, MD 07/10/2016 10:06:47 AM This report has been signed electronically. Number of Addenda: 0 Note Initiated On: 07/10/2016 9:41 AM Scope Withdrawal Time: 0 hours 7 minutes 34 seconds  Total Procedure Duration: 0 hours 9 minutes 28 seconds       [facility]

## 2016-07-10 NOTE — Transfer of Care (Signed)
Immediate Anesthesia Transfer of Care Note  Patient: Toni Mcdonald  Procedure(s) Performed: Procedure(s): COLONOSCOPY WITH PROPOFOL (N/A)  Patient Location: PACU  Anesthesia Type: MAC  Level of Consciousness: awake, alert  and patient cooperative  Airway and Oxygen Therapy: Patient Spontanous Breathing and Patient connected to supplemental oxygen  Post-op Assessment: Post-op Vital signs reviewed, Patient's Cardiovascular Status Stable, Respiratory Function Stable, Patent Airway and No signs of Nausea or vomiting  Post-op Vital Signs: Reviewed and stable  Complications: No apparent anesthesia complications

## 2016-07-11 ENCOUNTER — Encounter: Payer: Self-pay | Admitting: Gastroenterology

## 2016-08-22 ENCOUNTER — Ambulatory Visit (INDEPENDENT_AMBULATORY_CARE_PROVIDER_SITE_OTHER): Payer: BLUE CROSS/BLUE SHIELD | Admitting: Family Medicine

## 2016-08-22 ENCOUNTER — Encounter: Payer: Self-pay | Admitting: Family Medicine

## 2016-08-22 VITALS — BP 128/68 | HR 80 | Temp 98.1°F | Resp 14 | Ht 60.0 in | Wt 117.0 lb

## 2016-08-22 DIAGNOSIS — Z0000 Encounter for general adult medical examination without abnormal findings: Secondary | ICD-10-CM

## 2016-08-22 DIAGNOSIS — Z1159 Encounter for screening for other viral diseases: Secondary | ICD-10-CM

## 2016-08-22 DIAGNOSIS — I10 Essential (primary) hypertension: Secondary | ICD-10-CM | POA: Diagnosis not present

## 2016-08-22 DIAGNOSIS — Z720 Tobacco use: Secondary | ICD-10-CM | POA: Diagnosis not present

## 2016-08-22 DIAGNOSIS — E559 Vitamin D deficiency, unspecified: Secondary | ICD-10-CM | POA: Diagnosis not present

## 2016-08-22 DIAGNOSIS — F17200 Nicotine dependence, unspecified, uncomplicated: Secondary | ICD-10-CM

## 2016-08-22 DIAGNOSIS — Z124 Encounter for screening for malignant neoplasm of cervix: Secondary | ICD-10-CM

## 2016-08-22 NOTE — Assessment & Plan Note (Signed)
Well controlled 

## 2016-08-22 NOTE — Progress Notes (Signed)
   Subjective:    Patient ID: Toni Mcdonald, female    DOB: 07/21/1961, 55 y.o.   MRN: [id number]  Patient presents for CPE with PAP (has had labs- supracerviccal hysterectomy )  Patient for complete physical exam Pap smear due she had supracervical hysterectomy last in 2014 Colonoscopy UTD Mammogram UTD  Discussed Hep C screening  TDAP UTD  Smoker , PNA vaccine UTD  [facility] reviewed   Review Of Systems:  GEN- denies fatigue, fever, weight loss,weakness, recent illness HEENT- denies eye drainage, change in vision, nasal discharge, CVS- denies chest pain, palpitations RESP- denies SOB, cough, wheeze ABD- denies N/V, change in stools, abd pain GU- denies dysuria, hematuria, dribbling, incontinence MSK- denies joint pain, muscle aches, injury Neuro- denies headache, dizziness, syncope, seizure activity       Objective:    BP 128/68 (BP Location: Left Arm, Patient Position: Sitting, Cuff Size: Normal)   Pulse 80   Temp 98.1 F (36.7 C) (Oral)   Resp 14   Ht 5' (1.524 m)   Wt 117 lb (53.1 kg)   BMI 22.85 kg/m  GEN- NAD, alert and oriented x3 HEENT- PERRL, EOMI, non injected sclera, pink conjunctiva, MMM, oropharynx clear Neck- Supple, no thyromegaly CVS- RRR, no murmur Breast- normal symmetry, no nipple inversion,no nipple drainage, no nodules or lumps felt Nodes- no axillary nodes RESP-CTAB ABD-NABS,soft,NT,ND GU- normal external genitalia, vaginal mucosa pink and moist, cervix visualized no growth, no blood form os, no  discharge, no CMT, no ovarian masses, no uterus  EXT- No edema Pulses- Radial, DP- 2+        Assessment & Plan:      Problem List Items Addressed This Visit    Vitamin D deficiency    Start 2000IU once a day, level at 22 ,       Smoker - Primary   Essential hypertension, benign    Well controlled        Other Visit Diagnoses    Need for hepatitis C screening test       Routine general medical examination at a health  care facility       CPE done PAP Smear done, other prevention UTD, reviewed labs at bedside Hep 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 Any transcriptional errors that result from this process are unintentional.

## 2016-08-22 NOTE — Assessment & Plan Note (Signed)
Start 2000IU once a day, level at 22 ,

## 2016-08-22 NOTE — Addendum Note (Signed)
Addended by: Phillips OdorSIX, CHRISTINA H on: 08/22/2016 05:17 PM   Modules accepted: Orders

## 2016-08-22 NOTE — Patient Instructions (Addendum)
Restart Vitamin D , do 2000IU once a day  Work on smoking Get Hep C  F/U  1 year

## 2016-08-27 ENCOUNTER — Telehealth: Payer: Self-pay

## 2016-08-27 LAB — PAP, THIN PREP W/HPV RFLX HPV TYPE 16/18: HPV DNA HIGH RISK: NOT DETECTED

## 2016-08-27 NOTE — Telephone Encounter (Signed)
-----   Message from Dorena BodoMary B Dixon, PA-C sent at 08/27/2016 10:46 AM EDT ----- Pap Smear Normal

## 2016-08-27 NOTE — Telephone Encounter (Signed)
Called patient. Gave lab results. Patient verbalized understanding.  

## 2017-02-06 ENCOUNTER — Other Ambulatory Visit: Payer: Self-pay | Admitting: Family Medicine

## 2017-03-17 ENCOUNTER — Encounter: Payer: Self-pay | Admitting: Family Medicine

## 2017-03-17 ENCOUNTER — Ambulatory Visit (INDEPENDENT_AMBULATORY_CARE_PROVIDER_SITE_OTHER): Payer: BLUE CROSS/BLUE SHIELD | Admitting: Family Medicine

## 2017-03-17 VITALS — BP 126/66 | HR 68 | Temp 98.1°F | Resp 14 | Ht 60.0 in | Wt 121.0 lb

## 2017-03-17 DIAGNOSIS — J0100 Acute maxillary sinusitis, unspecified: Secondary | ICD-10-CM

## 2017-03-17 DIAGNOSIS — J209 Acute bronchitis, unspecified: Secondary | ICD-10-CM

## 2017-03-17 DIAGNOSIS — F17200 Nicotine dependence, unspecified, uncomplicated: Secondary | ICD-10-CM

## 2017-03-17 MED ORDER — AZITHROMYCIN 250 MG PO TABS: ORAL_TABLET | ORAL | 0 refills | Status: DC

## 2017-03-17 MED ORDER — PREDNISONE 20 MG PO TABS: 40.0000 mg | ORAL_TABLET | Freq: Every day | ORAL | 0 refills | Status: DC

## 2017-03-17 NOTE — Progress Notes (Signed)
   Subjective:    Patient ID: Toni Mcdonald, female    DOB: 10/22/1961, 56 y.o.   MRN: [id number]  Patient presents for Illness (x1 week- sinus congestion, nonprodictive cough)  Patient with progressive symptoms of cough congestion now tightness and wheezing in her chest. She is a smoker. She states her her sinuses start Actonel with the change in weather she has been using nasal spray which has helped some. The past 2 days however she noted more drainage and into her chest where she had tightness and wheezing starting to use her albuterol yesterday. She's not had any fever no chills.    Review Of Systems:  GEN- denies fatigue, fever, weight loss,weakness, recent illness HEENT- denies eye drainage, change in vision, +nasal discharge, CVS- denies chest pain, palpitations RESP- denies SOB, +cough, +wheeze ABD- denies N/V, change in stools, abd pain GU- denies dysuria, hematuria, dribbling, incontinence MSK- denies joint pain, muscle aches, injury Neuro- denies headache, dizziness, syncope, seizure activity       Objective:    BP 126/66   Pulse 68   Temp 98.1 F (36.7 C) (Oral)   Resp 14   Ht 5' (1.524 m)   Wt 121 lb (54.9 kg)   SpO2 99%   BMI 23.63 kg/m  GEN- NAD, alert and oriented x3 HEENT- PERRL, EOMI, non injected sclera, pink conjunctiva, MMM, oropharynx mild injection, TM clear bilat no effusion,  + maxillary sinus tenderness, inflammed turbinates,  Nasal drainage  Neck- Supple, no LAD CVS- RRR, no murmur RESP-few exiparatory wheeze, no rales, normal WOB EXT- No edema Pulses- Radial 2+          Assessment & Plan:      Problem List Items Addressed This Visit    Smoker - Primary    Other Visit Diagnoses    Acute bronchitis, unspecified organism       Bronchitis with sinusitis. Smoker. Treat with prednisone, albuterol OTC cough med, she can decongsest as well, given zpak whcih she tolerates   Acute maxillary sinusitis, recurrence not specified       Relevant Medications   predniSONE (DELTASONE) 20 MG tablet   azithromycin (ZITHROMAX) 250 MG tablet      Note: This dictation was prepared with Dragon dictation along with smaller phrase technology. Any transcriptional errors that result from this process are unintentional.

## 2017-03-17 NOTE — Patient Instructions (Signed)
Use over the counter cough medicine zpak/prednisone sent in F/U as needed

## 2017-04-07 ENCOUNTER — Other Ambulatory Visit: Payer: Self-pay | Admitting: *Deleted

## 2017-04-07 MED ORDER — AMLODIPINE BESYLATE 5 MG PO TABS: 5.0000 mg | ORAL_TABLET | Freq: Every day | ORAL | 3 refills | Status: DC

## 2017-05-04 LAB — HM MAMMOGRAPHY

## 2017-10-09 ENCOUNTER — Ambulatory Visit: Payer: BLUE CROSS/BLUE SHIELD | Admitting: Family Medicine

## 2018-02-19 ENCOUNTER — Other Ambulatory Visit: Payer: Self-pay | Admitting: Family Medicine

## 2018-02-19 ENCOUNTER — Ambulatory Visit (INDEPENDENT_AMBULATORY_CARE_PROVIDER_SITE_OTHER): Payer: 59 | Admitting: Family Medicine

## 2018-02-19 ENCOUNTER — Ambulatory Visit (HOSPITAL_COMMUNITY)
Admission: RE | Admit: 2018-02-19 | Discharge: 2018-02-19 | Disposition: A | Discharge status: Home / Self Care | Payer: 59 | Source: Ambulatory Visit | Attending: Family Medicine | Admitting: Family Medicine

## 2018-02-19 ENCOUNTER — Other Ambulatory Visit: Payer: Self-pay

## 2018-02-19 ENCOUNTER — Encounter: Payer: Self-pay | Admitting: Family Medicine

## 2018-02-19 VITALS — BP 124/62 | HR 88 | Temp 98.1°F | Resp 16 | Ht 60.0 in | Wt 125.0 lb

## 2018-02-19 DIAGNOSIS — M7989 Other specified soft tissue disorders: Secondary | ICD-10-CM

## 2018-02-19 DIAGNOSIS — S8012XA Contusion of left lower leg, initial encounter: Secondary | ICD-10-CM

## 2018-02-19 DIAGNOSIS — X58XXXA Exposure to other specified factors, initial encounter: Secondary | ICD-10-CM | POA: Diagnosis not present

## 2018-02-19 DIAGNOSIS — I10 Essential (primary) hypertension: Secondary | ICD-10-CM

## 2018-02-19 NOTE — Patient Instructions (Addendum)
Schedule a physical Ultrasound to be done today at [facility]

## 2018-02-19 NOTE — Assessment & Plan Note (Addendum)
Blood pressure controlled on norvasc  Pt to schedulE CPE for labs

## 2018-02-19 NOTE — Progress Notes (Signed)
   Subjective:    Patient ID: Toni Mcdonald, female    DOB: 06/02/1961, 57 y.o.   MRN: [id number]  Patient presents for Leg Pain (back of L leg- had brusing to top of thigh, but thinks it may havebeen busted vein- now has pain and is uncomfortable to put pressure on)  Pt here with left leg swelling, left calf pain/swelling over the past week. She initially had a large bruise on posterior leg that has resolved, no particular injury. Now with pain in calf, unable to cross her leg without pain. She is a smoker  No recent travel, no hormone therapy, had recent illness given tessalon and levaquin.  Medications reviewed- taking norvasc daily   Review Of Systems:  GEN- denies fatigue, fever, weight loss,weakness, recent illness HEENT- denies eye drainage, change in vision, nasal discharge, CVS- denies chest pain, palpitations RESP- denies SOB, cough, wheeze ABD- denies N/V, change in stools, abd pain GU- denies dysuria, hematuria, dribbling, incontinence MSK- denies joint pain,+ muscle aches, injury Neuro- denies headache, dizziness, syncope, seizure activity       Objective:    BP 124/62   Pulse 88   Temp 98.1 F (36.7 C) (Oral)   Resp 16   Ht 5' (1.524 m)   Wt 125 lb (56.7 kg)   SpO2 97%   BMI 24.41 kg/m  GEN- NAD, alert and oriented x3 CVS- RRR, no murmur RESP-CTAB ABD-NABS,soft,NT,ND EXT- TTP left upper calf, mild swelling, no cords palpated, no swelling of popliteal region, small bruise on left post thigh, nodule or vein palpated  Pulses- Radial, DP- 2+        Assessment & Plan:      Problem List Items Addressed This Visit      Unprioritized   Essential hypertension, benign    Blood pressure controlled on norvasc  Pt to schedulE CPE for labs       Other Visit Diagnoses    Calf swelling    -  Primary   Concern for possible DVT, or superficial phlebitis, sent for UC US, no DVT or acute finding, treat with her compression socks, ICE/HEAT , may have had  superficial vein inflammed or broken   Relevant Orders   VAS US LOWER EXTREMITY VENOUS (DVT)   US Venous Img Lower Unilateral Left (Completed)   Contusion of left lower leg, initial encounter       Relevant Orders   VAS US LOWER EXTREMITY VENOUS (DVT)   US Venous Img Lower Unilateral Left (Completed)      Note: This dictation was prepared with Dragon dictation along with smaller phrase technology. Any transcriptional errors that result from this process are unintentional.

## 2018-04-18 ENCOUNTER — Other Ambulatory Visit: Payer: Self-pay | Admitting: Family Medicine

## 2018-07-19 ENCOUNTER — Other Ambulatory Visit: Payer: 59

## 2018-07-19 DIAGNOSIS — Z0000 Encounter for general adult medical examination without abnormal findings: Secondary | ICD-10-CM

## 2018-07-21 ENCOUNTER — Encounter: Payer: Self-pay | Admitting: Family Medicine

## 2018-07-21 ENCOUNTER — Ambulatory Visit (INDEPENDENT_AMBULATORY_CARE_PROVIDER_SITE_OTHER): Payer: 59 | Admitting: Family Medicine

## 2018-07-21 ENCOUNTER — Other Ambulatory Visit: Payer: Self-pay | Admitting: Family Medicine

## 2018-07-21 VITALS — BP 136/80 | HR 86 | Temp 98.6°F | Resp 16 | Ht 60.0 in | Wt 125.0 lb

## 2018-07-21 DIAGNOSIS — R0989 Other specified symptoms and signs involving the circulatory and respiratory systems: Secondary | ICD-10-CM | POA: Diagnosis not present

## 2018-07-21 DIAGNOSIS — Z23 Encounter for immunization: Secondary | ICD-10-CM

## 2018-07-21 DIAGNOSIS — Z0000 Encounter for general adult medical examination without abnormal findings: Secondary | ICD-10-CM | POA: Diagnosis not present

## 2018-07-21 DIAGNOSIS — Z1231 Encounter for screening mammogram for malignant neoplasm of breast: Secondary | ICD-10-CM | POA: Diagnosis not present

## 2018-07-21 DIAGNOSIS — Z1239 Encounter for other screening for malignant neoplasm of breast: Secondary | ICD-10-CM

## 2018-07-21 DIAGNOSIS — E7800 Pure hypercholesterolemia, unspecified: Secondary | ICD-10-CM

## 2018-07-21 DIAGNOSIS — F17200 Nicotine dependence, unspecified, uncomplicated: Secondary | ICD-10-CM | POA: Diagnosis not present

## 2018-07-21 DIAGNOSIS — I10 Essential (primary) hypertension: Secondary | ICD-10-CM

## 2018-07-21 LAB — CBC WITH DIFFERENTIAL/PLATELET
BASOS ABS: 127 {cells}/uL (ref 0–200)
Basophils Relative: 1.2 %
EOS PCT: 1.1 %
Eosinophils Absolute: 117 cells/uL (ref 15–500)
HEMATOCRIT: 44.8 % (ref 35.0–45.0)
Hemoglobin: 14.8 g/dL (ref 11.7–15.5)
LYMPHS ABS: 2639 {cells}/uL (ref 850–3900)
MCH: 28.7 pg (ref 27.0–33.0)
MCHC: 33 g/dL (ref 32.0–36.0)
MCV: 86.8 fL (ref 80.0–100.0)
MONOS PCT: 6.3 %
MPV: 10.3 fL (ref 7.5–12.5)
NEUTROS ABS: 7049 {cells}/uL (ref 1500–7800)
NEUTROS PCT: 66.5 %
Platelets: 426 10*3/uL — ABNORMAL HIGH (ref 140–400)
RBC: 5.16 10*6/uL — AB (ref 3.80–5.10)
RDW: 13.1 % (ref 11.0–15.0)
Total Lymphocyte: 24.9 %
WBC mixed population: 668 cells/uL (ref 200–950)
WBC: 10.6 10*3/uL (ref 3.8–10.8)

## 2018-07-21 LAB — TSH: TSH: 0.63 m[IU]/L

## 2018-07-21 LAB — COMPREHENSIVE METABOLIC PANEL
AG RATIO: 2.4 (calc) (ref 1.0–2.5)
ALBUMIN MSPROF: 4.6 g/dL (ref 3.6–5.1)
ALKALINE PHOSPHATASE (APISO): 117 U/L (ref 33–130)
ALT: 15 U/L (ref 6–29)
AST: 18 U/L (ref 10–35)
BILIRUBIN TOTAL: 0.4 mg/dL (ref 0.2–1.2)
BUN: 9 mg/dL (ref 7–25)
CALCIUM: 9.8 mg/dL (ref 8.6–10.4)
CHLORIDE: 104 mmol/L (ref 98–110)
CO2: 28 mmol/L (ref 20–32)
Creat: 0.74 mg/dL (ref 0.50–1.05)
GLOBULIN: 1.9 g/dL (ref 1.9–3.7)
Glucose, Bld: 97 mg/dL (ref 65–99)
POTASSIUM: 4.8 mmol/L (ref 3.5–5.3)
Sodium: 140 mmol/L (ref 135–146)
Total Protein: 6.5 g/dL (ref 6.1–8.1)

## 2018-07-21 LAB — LIPID PANEL
CHOLESTEROL: 215 mg/dL — AB (ref <200)
HDL: 69 mg/dL (ref >50)
LDL Cholesterol (Calc): 124 mg/dL (calc) — ABNORMAL HIGH
NON-HDL CHOLESTEROL (CALC): 146 mg/dL — AB (ref <130)
Total CHOL/HDL Ratio: 3.1 (calc) (ref <5.0)
Triglycerides: 108 mg/dL (ref <150)

## 2018-07-21 LAB — VITAMIN D 1,25 DIHYDROXY
Vitamin D 1, 25 (OH)2 Total: 71 pg/mL (ref 18–72)
Vitamin D2 1, 25 (OH)2: <8 pg/mL
Vitamin D3 1, 25 (OH)2: 71 pg/mL

## 2018-07-21 NOTE — Patient Instructions (Addendum)
Work on tobacco cessation Schedule your mammogram  Check on shingles vaccine  Tetanus booster given  Ultrasound to be done of carotids  F/U 3 months for lab visit for cholesterol F/U 1 year for physical

## 2018-07-21 NOTE — Progress Notes (Signed)
   Subjective:    Patient ID: Toni Mcdonald, female    DOB: 07/27/1961, 57 y.o.   MRN: [id number]  Patient presents for Annual Exam  Patient here for complete physical exam.  Medications reviewed family history and medical history reviewed Colonoscopy up-to-date 2017 Mammogra last 07/17/2017 discussed Immunizations pneumonia vaccine up-to-date.  She is due for tetanus booster. Due for shingles vaccine Due for TDAP  HTN- taking norvasc no concerns  GERD-Had heartburn past few weeks has been taking zantac, was on PPI  Tobacco use- 1ppd  UTD with dentist and eye doctor    Review Of Systems:  GEN- denies fatigue, fever, weight loss,weakness, recent illness HEENT- denies eye drainage, change in vision, nasal discharge, CVS- denies chest pain, palpitations RESP- denies SOB, cough, wheeze ABD- denies N/V, change in stools, abd pain GU- denies dysuria, hematuria, dribbling, incontinence MSK- denies joint pain, muscle aches, injury Neuro- denies headache, dizziness, syncope, seizure activity       Objective:    BP 136/80   Pulse 86   Temp 98.6 F (37 C) (Oral)   Resp 16   Ht 5' (1.524 m)   Wt 125 lb (56.7 kg)   SpO2 97%   BMI 24.41 kg/m  GEN- NAD, alert and oriented x3 HEENT- PERRL, EOMI, non injected sclera, pink conjunctiva, MMM, oropharynx clear, TM clear bilat no effusion  Neck- Supple, no thyromegaly, left carotid bruit  CVS- RRR, no murmur RESP-CTAB ABD-NABS,soft,NT,ND EXT- No edema Pulses- Radial, DP- 2+        Assessment & Plan:     CAGE/Depression screen Negative  Problem List Items Addressed This Visit      Unprioritized   Essential hypertension, benign    Well controlled, reviewed fasting labs, lipids a little elevated But increased risk of CAD/Stroke in setting of smoking as well She also has carotid bruit which US/duplex will be done Will determine statin drug after results      Hyperlipidemia   Smoker    counsled on cessation pt not  ready to quit        Other Visit Diagnoses    Routine general medical examination at a health care facility    -  Primary   CPE done, reviewed labs, TDAP given, shingles she will check with insurance   Breast cancer screening       Left carotid bruit       Relevant Orders   US Carotid Duplex Bilateral      Note: This dictation was prepared with Dragon dictation along with smaller phrase technology. Any transcriptional errors that result from this process are unintentional.

## 2018-07-22 ENCOUNTER — Encounter: Payer: Self-pay | Admitting: Family Medicine

## 2018-07-22 DIAGNOSIS — E785 Hyperlipidemia, unspecified: Secondary | ICD-10-CM | POA: Insufficient documentation

## 2018-07-22 NOTE — Assessment & Plan Note (Signed)
Well controlled, reviewed fasting labs, lipids a little elevated But increased risk of CAD/Stroke in setting of smoking as well She also has carotid bruit which US/duplex will be done Will determine statin drug after results

## 2018-07-22 NOTE — Assessment & Plan Note (Signed)
counsled on cessation pt not ready to quit

## 2018-07-28 ENCOUNTER — Ambulatory Visit (HOSPITAL_COMMUNITY)
Admission: RE | Admit: 2018-07-28 | Discharge: 2018-07-28 | Disposition: A | Discharge status: Home / Self Care | Payer: 59 | Source: Ambulatory Visit | Attending: Family Medicine | Admitting: Family Medicine

## 2018-07-28 DIAGNOSIS — I6523 Occlusion and stenosis of bilateral carotid arteries: Secondary | ICD-10-CM | POA: Insufficient documentation

## 2018-07-28 DIAGNOSIS — R0989 Other specified symptoms and signs involving the circulatory and respiratory systems: Secondary | ICD-10-CM | POA: Insufficient documentation

## 2018-07-29 ENCOUNTER — Other Ambulatory Visit: Payer: Self-pay | Admitting: *Deleted

## 2018-07-29 DIAGNOSIS — I6523 Occlusion and stenosis of bilateral carotid arteries: Secondary | ICD-10-CM

## 2018-07-29 MED ORDER — ATORVASTATIN CALCIUM 40 MG PO TABS: 40.0000 mg | ORAL_TABLET | Freq: Every day | ORAL | 3 refills | Status: DC

## 2018-08-06 ENCOUNTER — Ambulatory Visit
Admission: RE | Admit: 2018-08-06 | Discharge: 2018-08-06 | Disposition: A | Discharge status: Home / Self Care | Payer: 59 | Source: Ambulatory Visit | Attending: Family Medicine | Admitting: Family Medicine

## 2018-08-06 DIAGNOSIS — Z1231 Encounter for screening mammogram for malignant neoplasm of breast: Secondary | ICD-10-CM | POA: Diagnosis not present

## 2018-08-16 ENCOUNTER — Other Ambulatory Visit: Payer: Self-pay | Admitting: *Deleted

## 2018-08-16 ENCOUNTER — Ambulatory Visit (INDEPENDENT_AMBULATORY_CARE_PROVIDER_SITE_OTHER): Payer: 59 | Admitting: Vascular Surgery

## 2018-08-16 ENCOUNTER — Encounter: Payer: Self-pay | Admitting: Vascular Surgery

## 2018-08-16 ENCOUNTER — Inpatient Hospital Stay
Admission: RE | Admit: 2018-08-16 | Discharge: 2018-08-16 | Disposition: A | Discharge status: Home / Self Care | Payer: Self-pay | Source: Ambulatory Visit | Attending: *Deleted | Admitting: *Deleted

## 2018-08-16 VITALS — BP 134/86 | HR 91 | Temp 97.8°F | Resp 18 | Ht 60.0 in | Wt 123.0 lb

## 2018-08-16 DIAGNOSIS — Z9289 Personal history of other medical treatment: Secondary | ICD-10-CM

## 2018-08-16 DIAGNOSIS — I6523 Occlusion and stenosis of bilateral carotid arteries: Secondary | ICD-10-CM

## 2018-08-16 NOTE — Progress Notes (Signed)
Vascular and [facility]in Specialist of [facility]  Patient name: Toni Mcdonald MRN: [id number] DOB: [birth year] Sex: female  REASON FOR CONSULT: Bilateral asymptomatic carotid stenosis  Seen today in our [place] office  HPI: Toni Mcdonald is a 57 y.o. female, who is seen for evaluation of bilateral asymptomatic carotid stenosis.  She is a very pleasant 56 year old who was found to have a harsh left carotid bruit by physical exam.  She subsequently underwent bilateral carotid duplex at [facility] and was found to have moderate bilateral carotid disease.  She is here today for further discussion.  She is here with her husband.  She specifically denies any prior amaurosis fugax, a aphasia or focal neurologic deficits.  She is right-handed.  He has an extremely high incidence of premature cardiac disease in her family members.  She had a brother die in his mid 67s, father had a heart transplant and mother has had multiple cardiac interventions.  He reports that she has had chest tightness and was afraid that she was having a myocardial infarction on 2 occasions and had a negative work-up.  She is on lipid-lowering medication.  Unfortunately she does have a long history of smoking  Past Medical History:  Diagnosis Date  . Anxiety   . Arthritis   . Bronchitis    hx of  . Chronic neck pain   . GERD (gastroesophageal reflux disease)   . Headache(784.0)    migraines  . Hypertension    controlled by diet and exercise  . Pneumonia    hx of    Family History  Problem Relation Age of Onset  . Arthritis Mother   . Heart disease Mother   . Hyperlipidemia Mother   . Hypertension Mother   . Stroke Mother   . Heart disease Father   . Hyperlipidemia Father   . Hypertension Father   . Kidney disease Father   . Stroke Father   . Thyroid disease Sister   . Thyroid disease Sister   . Thyroid disease Sister   . Vision loss Maternal Grandfather    . Haruto Demaria death Brother   . Heart disease Brother 70       Died at 44 with MI  . Heart attack Brother     SOCIAL HISTORY: Social History   Socioeconomic History  . Marital status: Married    Spouse name: Not on file  . Number of children: Not on file  . Years of education: Not on file  . Highest education level: Not on file  Occupational History  . Not on file  Social Needs  . Financial resource strain: Not on file  . Food insecurity:    Worry: Not on file    Inability: Not on file  . Transportation needs:    Medical: Not on file    Non-medical: Not on file  Tobacco Use  . Smoking status: Current Every Day Smoker    Packs/day: 1.00    Years: 35.00    Pack years: 35.00    Types: Cigarettes  . Smokeless tobacco: Never Used  Substance and Sexual Activity  . Alcohol use: Yes    Alcohol/week: 4.0 standard drinks    Types: 4 Glasses of wine per week    Comment: occasional  . Drug use: No  . Sexual activity: Yes  Lifestyle  . Physical activity:    Days per week: Not on file    Minutes per session: Not on file  . Stress: Not  on file  Relationships  . Social connections:    Talks on phone: Not on file    Gets together: Not on file    Attends religious service: Not on file    Active member of club or organization: Not on file    Attends meetings of clubs or organizations: Not on file    Relationship status: Not on file  . Intimate partner violence:    Fear of current or ex partner: Not on file    Emotionally abused: Not on file    Physically abused: Not on file    Forced sexual activity: Not on file  Other Topics Concern  . Not on file  Social History Narrative   Married.    2 children. 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 Penicillins Anaphylaxis    "whelps"  . Codeine Itching and Swelling    "skin crawls'  . Neosporin [Neomycin-Bacitracin Zn-Polymyx]      "blisters the skin"  . Percocet [Oxycodone-Acetaminophen]     Nausea and vomiting  . Sulfamethoxazole-Trimethoprim   . Wellbutrin [Bupropion] Hives    Current Outpatient Medications  Medication Sig Dispense Refill  . amLODipine (NORVASC) 5 MG tablet TAKE 1 TABLET (5 MG TOTAL) BY MOUTH DAILY. 90 tablet 3  . atorvastatin (LIPITOR) 40 MG tablet Take 1 tablet (40 mg total) by mouth daily. 90 tablet 3  . Propylene Glycol (SYSTANE BALANCE) 0.6 % SOLN Place 2 drops into both eyes daily.    . ranitidine (ZANTAC) 150 MG tablet Take 150 mg by mouth 2 (two) times daily.    . sodium chloride (OCEAN) 0.65 % SOLN nasal spray Place 1 spray into the nose as needed. For dry nose     No current facility-administered medications for this visit.     REVIEW OF SYSTEMS:  [X]  denotes positive finding, [ ]  denotes negative finding Cardiac  Comments:  Chest pain or chest pressure:    Shortness of breath upon exertion:    Short of breath when lying flat:    Irregular heart rhythm:        Vascular    Pain in calf, thigh, or hip brought on by ambulation: x   Pain in feet at night that wakes you up from your sleep:     Blood clot in your veins:    Leg swelling:  x       Pulmonary    Oxygen at home:    Productive cough:     Wheezing:         Neurologic    Sudden weakness in arms or legs:     Sudden numbness in arms or legs:     Sudden onset of difficulty speaking or slurred speech:    Temporary loss of vision in one eye:     Problems with dizziness:         Gastrointestinal    Blood in stool:     Vomited blood:         Genitourinary    Burning when urinating:     Blood in urine:        Psychiatric    Major depression:         Hematologic    Bleeding problems:    Problems with blood clotting too easily:        Skin    Rashes or ulcers:  Constitutional    Fever or chills:      PHYSICAL EXAM: Vitals:   08/16/18 0904  BP: 134/86  Pulse: 91  Resp: 18  Temp: 97.8 F  (36.6 C)  TempSrc: Temporal  Weight: 123 lb (55.8 kg)  Height: 5' (1.524 m)    GENERAL: The patient is a well-nourished female, in no acute distress. The vital signs are documented above. CARDIOVASCULAR: Harsh left carotid bruit.  No bruit on the right.  2 plus radial and 2+ dorsalis pedis pulses bilaterally PULMONARY: There is good air exchange  ABDOMEN: Soft and non-tender  MUSCULOSKELETAL: There are no major deformities or cyanosis. NEUROLOGIC: No focal weakness or paresthesias are detected. SKIN: There are no ulcers or rashes noted. PSYCHIATRIC: The patient has a normal affect.  DATA:  I reviewed her duplex from [facility] from 07/28/2018.  This shows extensive plaque bilaterally in her bifurcations.  This does result in estimated 50 to 69% stenosis  MEDICAL ISSUES: I had long discussion with the patient and her husband present.  I explained the significance of her moderate to severe bilateral carotid stenosis.  I explained that this is quite advanced for her age of 57.  I explained that her major risk factor for progressive atherosclerotic disease is cigarette smoking.  I did encourage her to continue to work on her lipid management and diet but that this is really far less important than smoking cessation.  I explained with her very strong family history of premature cardiac disease that she was at high risk for progression.  Reviewed symptoms of carotid disease and patient will dial 911 and report immediately for any neurologic deficits.  Otherwise we will see her again in 1 year with carotid duplex follow-up   Larina Earthlyodd F. Tavaughn Silguero, MD [facility] Tel [phone] Pager [phone]

## 2018-08-31 ENCOUNTER — Telehealth: Payer: Self-pay | Admitting: *Deleted

## 2018-08-31 MED ORDER — PRAVASTATIN SODIUM 20 MG PO TABS: 20.0000 mg | ORAL_TABLET | Freq: Every day | ORAL | 3 refills | Status: DC

## 2018-08-31 NOTE — Telephone Encounter (Signed)
Received call from patient.   States that she began taking Lipitor in July 2019. States that she has every major side effect from muscle cramps and nausea. States that she has noted that she was also having memory and cognition issues. Reports that she stopped Lipitor x2 days prior and memory issues have been improving.   States that she knows she needs some medication for her cholesterol. Reports that she does not want to try medication again, or anything stronger like Crestor for fear of SE.   MD please advise.

## 2018-08-31 NOTE — Telephone Encounter (Signed)
Try pravastatin 20 mg a day and recheck in 3 months.

## 2018-08-31 NOTE — Telephone Encounter (Signed)
Call placed to patient and patient made aware.   Patient is agreeable to trying Pravastatin, but would like to start it next week to ensure that all the Lipitor is out of her system first.   Prescription sent to pharmacy.

## 2018-09-29 IMAGING — US US EXTREM LOW VENOUS*L*
1 series · 14 of 24 positions shown · non-contrast
Comparison: None.

CLINICAL DATA: Left calf swelling for 2 days.  Left calf pain.

EXAM:
LEFT LOWER EXTREMITY VENOUS DUPLEX ULTRASOUND
TECHNIQUE: Doppler venous assessment of the left lower extremity deep venous
system was performed, including characterization of spectral flow,
compressibility, and phasicity.

[Series 1: us extrem low venous*left* · 0.06mm/px · 14 of 35 slices shown]
[im 1/35]
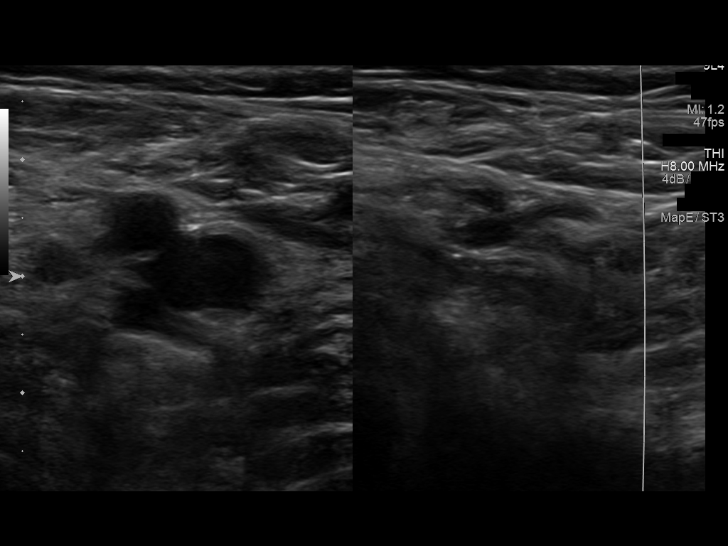
[im 3/35]
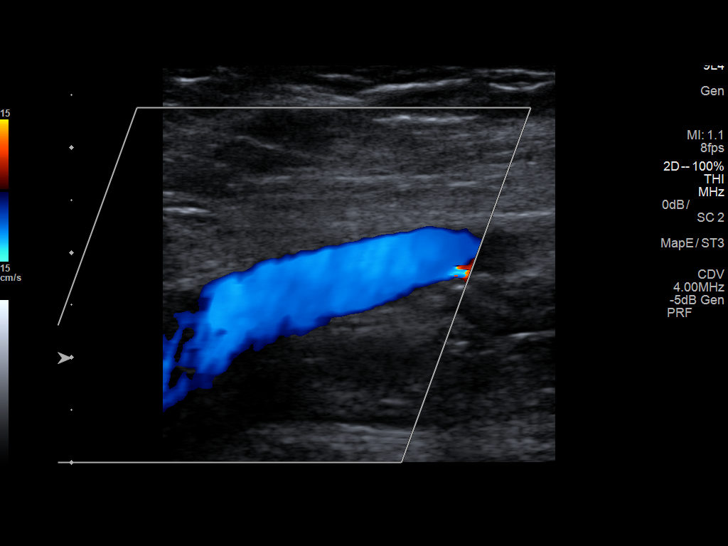
[im 6/35]
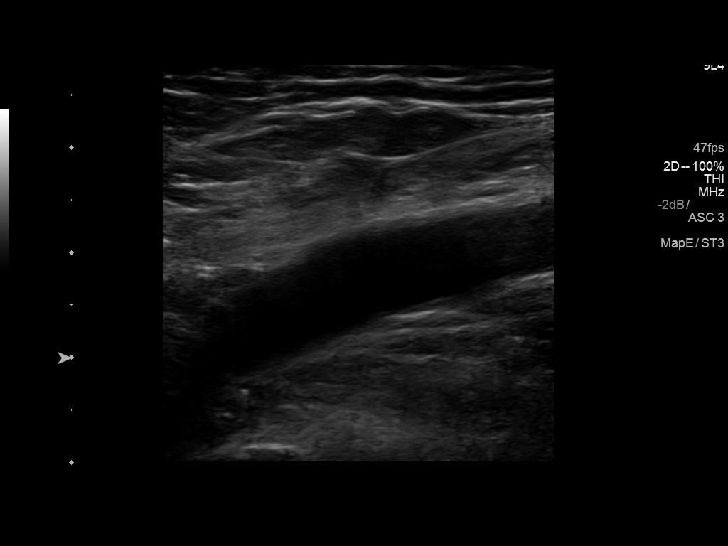
[im 9/35]
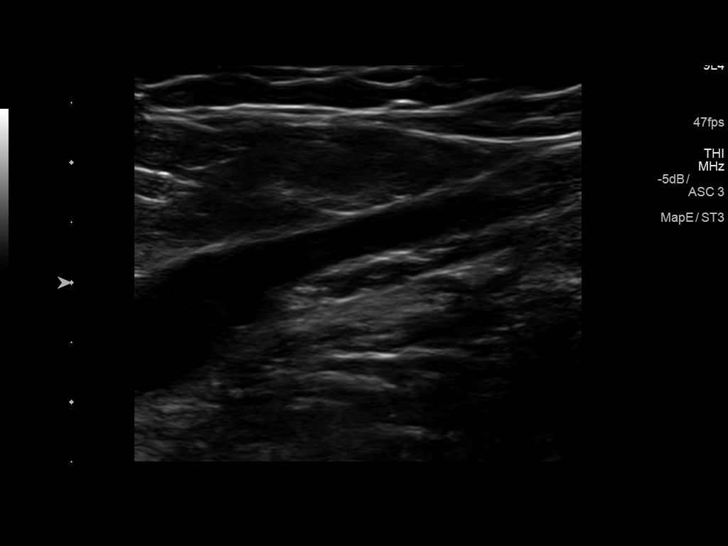
[im 11/35]
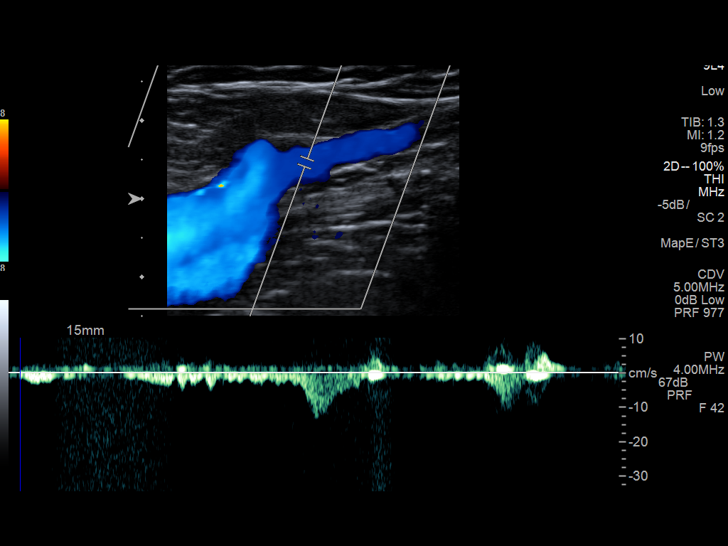
[im 14/35]
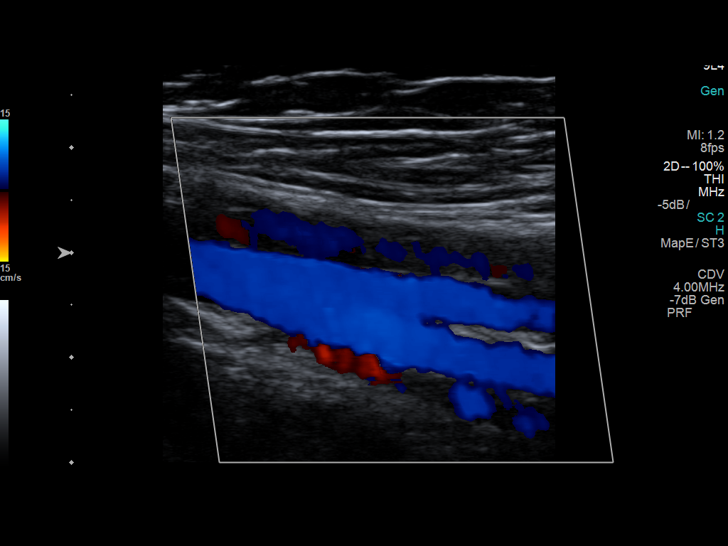
[im 17/35]
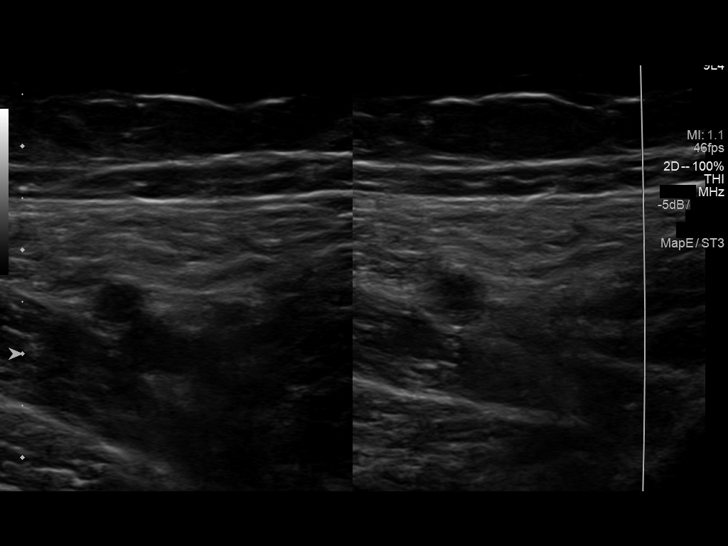
[im 18/35]
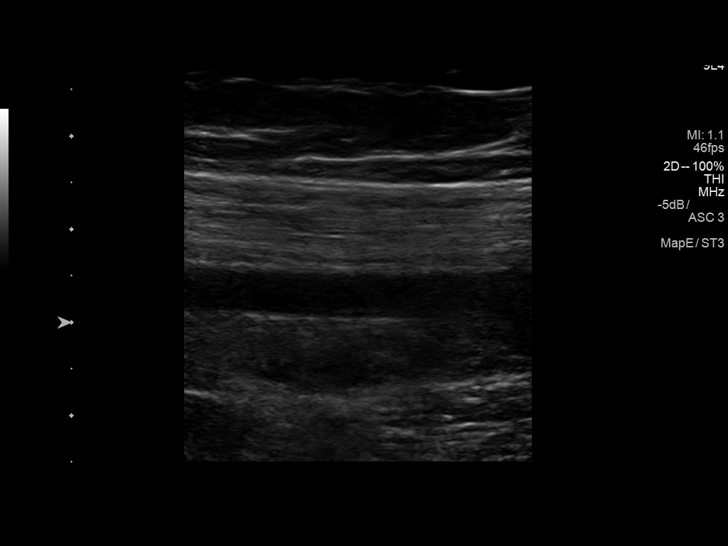
[im 21/35]
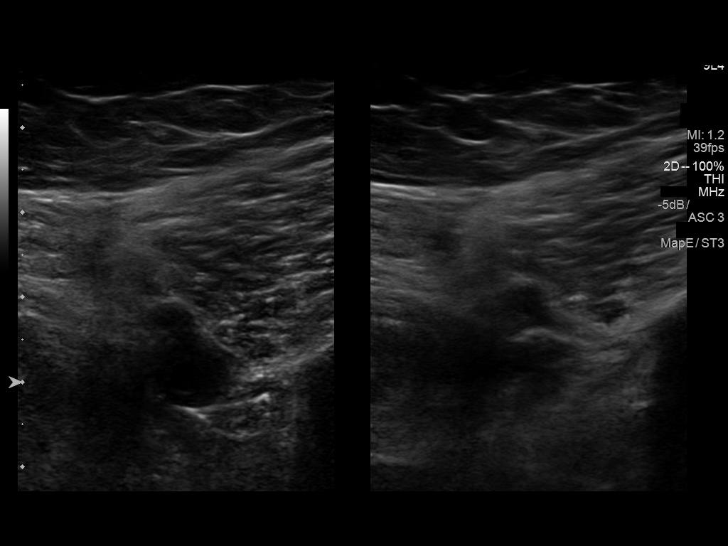
[im 24/35]
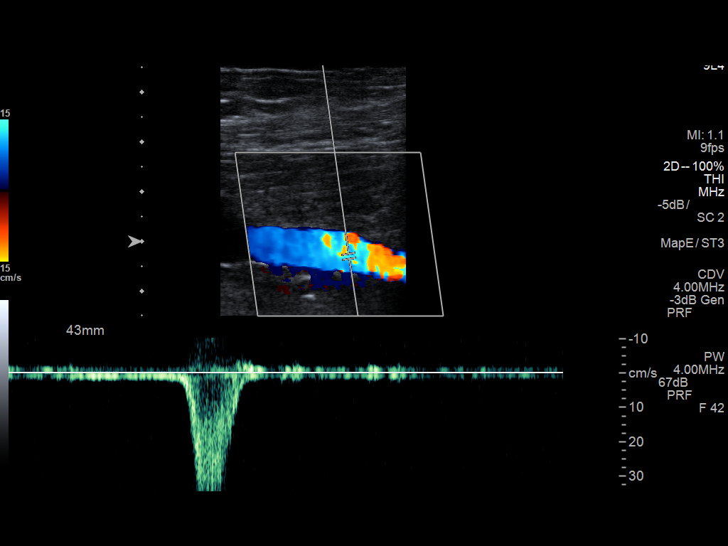
[im 27/35]
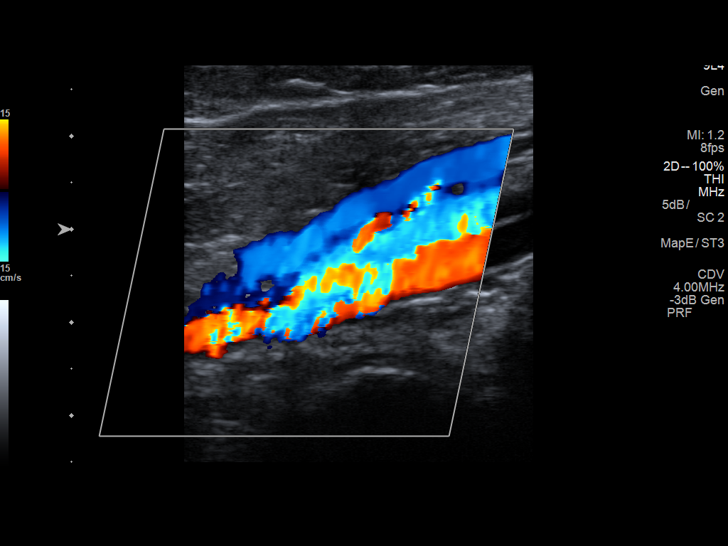
[im 29/35]
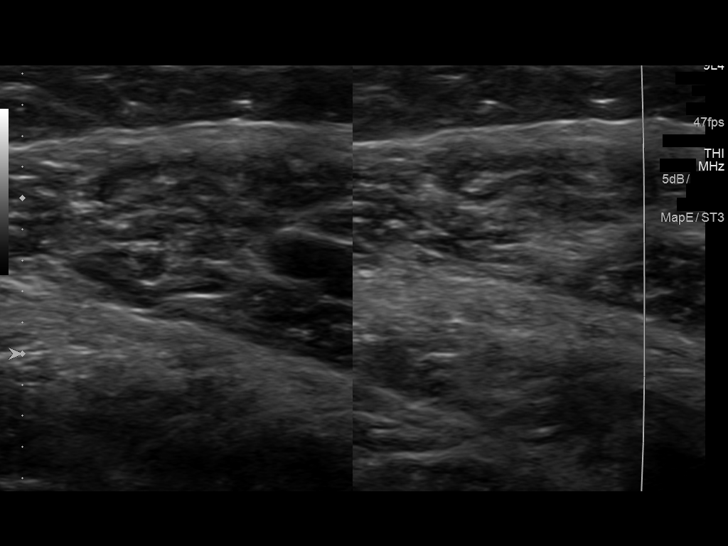
[im 32/35]
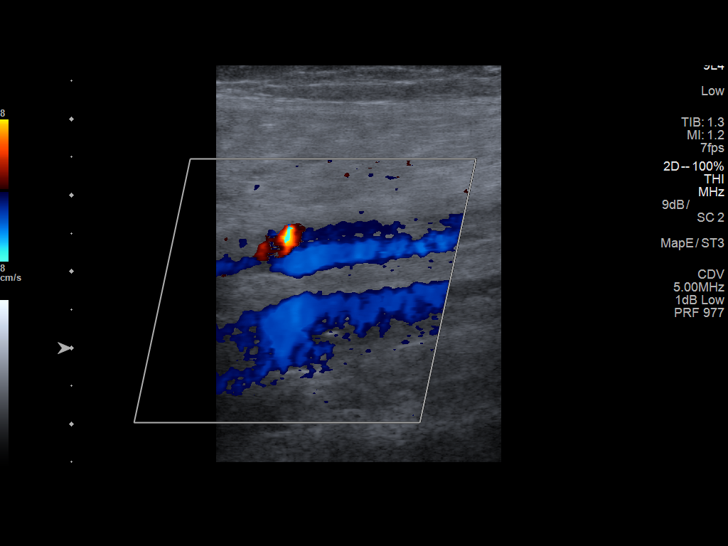
[im 35/35]
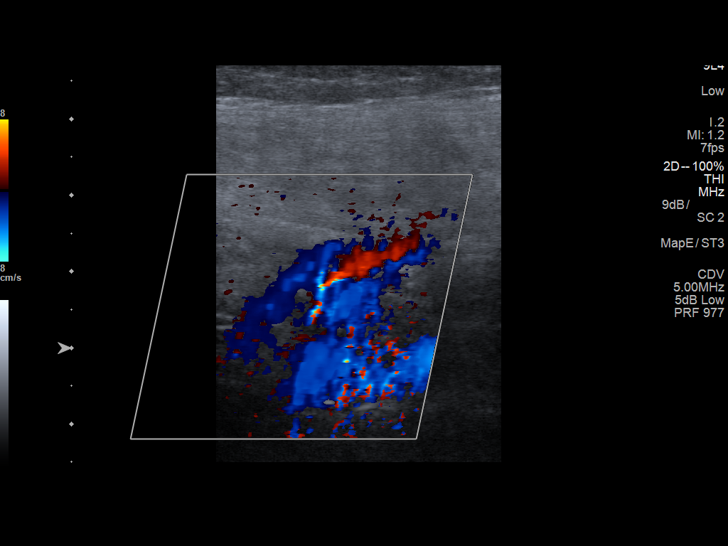

[14 of 24 positions shown; findings below may reference images not displayed]

FINDINGS: There is complete compressibility of the left common femoral,
femoral, and popliteal veins. Doppler analysis demonstrates
respiratory phasicity and augmentation of flow with calf
compression. No obvious superficial vein or calf vein thrombosis.
IMPRESSION: No evidence of left lower extremity DVT.

## 2018-10-21 ENCOUNTER — Other Ambulatory Visit: Payer: 59

## 2018-10-21 DIAGNOSIS — E7800 Pure hypercholesterolemia, unspecified: Secondary | ICD-10-CM

## 2018-10-22 ENCOUNTER — Other Ambulatory Visit: Payer: Self-pay | Admitting: *Deleted

## 2018-10-22 LAB — LIPID PANEL
Cholesterol: 220 mg/dL — ABNORMAL HIGH (ref <200)
HDL: 66 mg/dL (ref >50)
LDL CHOLESTEROL (CALC): 129 mg/dL — AB
NON-HDL CHOLESTEROL (CALC): 154 mg/dL — AB (ref <130)
Total CHOL/HDL Ratio: 3.3 (calc) (ref <5.0)
Triglycerides: 134 mg/dL (ref <150)

## 2018-12-31 ENCOUNTER — Ambulatory Visit
Admission: EM | Admit: 2018-12-31 | Discharge: 2018-12-31 | Disposition: A | Discharge status: Home / Self Care | Payer: 59 | Attending: Family Medicine | Admitting: Family Medicine

## 2018-12-31 ENCOUNTER — Ambulatory Visit: Payer: 59 | Attending: Family Medicine

## 2018-12-31 ENCOUNTER — Other Ambulatory Visit: Payer: Self-pay

## 2018-12-31 DIAGNOSIS — R05 Cough: Secondary | ICD-10-CM | POA: Diagnosis not present

## 2018-12-31 DIAGNOSIS — R053 Chronic cough: Secondary | ICD-10-CM

## 2018-12-31 MED ORDER — BUDESONIDE-FORMOTEROL FUMARATE 160-4.5 MCG/ACT IN AERO: 2.0000 | INHALATION_SPRAY | Freq: Two times a day (BID) | RESPIRATORY_TRACT | 3 refills | Status: DC

## 2018-12-31 MED ORDER — HYDROCOD POLST-CPM POLST ER 10-8 MG/5ML PO SUER: 5.0000 mL | Freq: Two times a day (BID) | ORAL | 0 refills | Status: DC | PRN

## 2018-12-31 NOTE — ED Provider Notes (Signed)
[facility]    CSN: [id number] Arrival date & time: 12/31/18  1236  History   Chief Complaint Chief Complaint  Patient presents with  . Cough   HPI  58 year old female presents with cough.  Patient is a longtime smoker.  Has a history of recurrent bronchitis.  I suspect that patient has underlying COPD.    Patient has had ongoing cough for greater than 1 month.  She was seen on 12/1 and prescribed cough medication, prednisone, doxycycline.  She states that she has failed to improve.  Severe cough.  Associated shortness of breath and hoarseness.  No fever.  No known exacerbating factors.  No other reported symptoms.  No other complaints.  PMH, Surgical Hx, Family Hx, Social History reviewed and updated as below.  Past Medical History:  Diagnosis Date  . Anxiety   . Arthritis   . Bronchitis    hx of  . Chronic neck pain   . GERD (gastroesophageal reflux disease)   . Headache(784.0)    migraines  . Hypertension    controlled by diet and exercise  . Pneumonia    hx of    Patient Active Problem List   Diagnosis Date Noted  . Hyperlipidemia 07/22/2018  . 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 Chest pain 04/11/2016  . Essential hypertension, benign 07/27/2014  . Anxiety 07/27/2014  . Vitamin D deficiency 07/27/2014  . Smoker 07/27/2014    Past Surgical History:  Procedure Laterality Date  . ABDOMINAL HYSTERECTOMY     Has both ovaries  . ANTERIOR CERVICAL DECOMP/DISCECTOMY FUSION  09/22/2012   Procedure: ANTERIOR CERVICAL DECOMPRESSION/DISCECTOMY FUSION 3 LEVELS;  Surgeon: Toni Alertavid S Jones, MD;  Location: [facility];  Service: Neurosurgery;  Laterality: N/A;  Anterior Cervical Diskectomy/Fusion with Plate, Cervical four through seven  . CARDIOVASCULAR STRESS TEST     2008  . COLONOSCOPY WITH PROPOFOL N/A 07/10/2016   Procedure: COLONOSCOPY WITH PROPOFOL;  Surgeon: Toni Miniumarren Wohl, MD;  [facility];  Service: Endoscopy;  Laterality: N/A;  . DIAGNOSTIC  LAPAROSCOPY     exploratory surgery due to "scarring"  . TUBAL LIGATION      OB History   No obstetric history on file.      Home Medications    Prior to Admission medications   Medication Sig Start Date End Date Taking? Authorizing Provider  amLODipine (NORVASC) 5 MG tablet TAKE 1 TABLET (5 MG TOTAL) BY MOUTH DAILY. 04/19/18  Yes [place], Toni HatchetKawanta F, MD  Propylene Glycol (SYSTANE BALANCE) 0.6 % SOLN Place 2 drops into both eyes daily.   Yes [provider]  ranitidine (ZANTAC) 150 MG tablet Take 150 mg by mouth 2 (two) times daily.   Yes [provider]  budesonide-formoterol (SYMBICORT) 160-4.5 MCG/ACT inhaler Inhale 2 puffs into the lungs 2 (two) times daily. 12/31/18   Toni Samsook, Chaim Gatley G, DO  chlorpheniramine-HYDROcodone (TUSSIONEX PENNKINETIC ER) 10-8 MG/5ML SUER Take 5 mLs by mouth every 12 (twelve) hours as needed. 12/31/18   Toni Samsook, Zacary Bauer G, DO    Family History Family History  Problem Relation Age of Onset  . Arthritis Mother   . Heart disease Mother   . Hyperlipidemia Mother   . Hypertension Mother   . Stroke Mother   . Heart disease Father   . Hyperlipidemia Father   . Hypertension Father   . Kidney disease Father   . Stroke Father   . Thyroid disease Sister   . Thyroid disease Sister   . Thyroid disease Sister   .  Vision loss Maternal Grandfather   . Early death Brother   . Heart disease Brother [id number]       Died at 5853 with MI  . Heart attack Brother     Social History Social History   Tobacco Use  . Smoking status: Current Every Day Smoker    Packs/day: 1.00    Years: 35.00    Pack years: 35.00    Types: Cigarettes  . Smokeless tobacco: Never Used  Substance Use Topics  . Alcohol use: Yes    Alcohol/week: 4.0 standard drinks    Types: 4 Glasses of wine per week    Comment: occasional  . Drug use: No     Allergies   Penicillins; Codeine; Neosporin [neomycin-bacitracin zn-polymyx]; Percocet [oxycodone-acetaminophen];  Sulfamethoxazole-trimethoprim; and Wellbutrin [bupropion]   Review of Systems Review of Systems  HENT: Positive for voice change.   Respiratory: Positive for cough and shortness of breath.    Physical Exam Triage Vital Signs ED Triage Vitals  Enc Vitals Group     BP 12/31/18 1313 118/85     Pulse Rate 12/31/18 1313 89     Resp 12/31/18 1313 18     Temp 12/31/18 1313 98.5 Mcdonald (36.9 C)     Temp Source 12/31/18 1313 Oral     SpO2 12/31/18 1313 97 %     Weight 12/31/18 1311 124 lb (56.2 kg)     Height 12/31/18 1311 5' (1.524 m)     Head Circumference --      Peak Flow --      Pain Score 12/31/18 1311 0     Pain Loc --      Pain Edu? --      Excl. in GC? --    Updated Vital Signs BP 118/85 (BP Location: Left Arm)   Pulse 89   Temp 98.5 Mcdonald (36.9 C) (Oral)   Resp 18   Ht 5' (1.524 m)   Wt 56.2 kg   SpO2 97%   BMI 24.22 kg/m   Visual Acuity Right Eye Distance:   Left Eye Distance:   Bilateral Distance:    Right Eye Near:   Left Eye Near:    Bilateral Near:     Physical Exam Vitals signs and nursing note reviewed.  Constitutional:      General: She is not in acute distress. HENT:     Head: Normocephalic and atraumatic.  Eyes:     General:        Right eye: No discharge.        Left eye: No discharge.     Conjunctiva/sclera: Conjunctivae normal.  Cardiovascular:     Rate and Rhythm: Normal rate and regular rhythm.  Pulmonary:     Effort: Pulmonary effort is normal. No respiratory distress.     Comments: No adventitious breath sounds appreciated.  Coughing throughout exam. Neurological:     Mental Status: She is alert.  Psychiatric:        Mood and Affect: Mood normal.        Behavior: Behavior normal.    UC Treatments / Results  Labs (all labs ordered are listed, but only abnormal results are displayed) Labs Reviewed - No data to display  EKG None  Radiology Dg Chest 2 View  Result Date: 12/31/2018 CLINICAL DATA:  Cough and hoarseness EXAM: CHEST  - 2 VIEW COMPARISON:  02/18/2016 FINDINGS: The heart size and mediastinal contours are within normal limits. Both lungs are clear. The visualized skeletal structures are  unremarkable. Postsurgical changes in the cervical spine are noted. IMPRESSION: No active cardiopulmonary disease. Electronically Signed   By: Alcide Clever M.D.   On: 12/31/2018 14:30    Procedures Procedures (including critical care time)  Medications Ordered in UC Medications - No data to display  Initial Impression / Assessment and Plan / UC Course  I have reviewed the triage vital signs and the nursing notes.  Pertinent labs & imaging results that were available during my care of the patient were reviewed by me and considered in my medical decision making (see chart for details).     58 year old female presents with chronic cough.  Chest x-ray negative.  Placing on Symbicort.  Tussionex if desired.  Recommended seeing pulmonology. Final Clinical Impressions(s) / UC Diagnoses   Final diagnoses:  Chronic cough     Discharge Instructions     This is a chronic cough. Chest xray was negative.  Try the cough medication.  Symbicort as prescribed.  I recommend that you see Pulmonology. Give them a call.  Take care  Dr. Adriana Simas    ED Prescriptions    Medication Sig Dispense Auth. Provider   budesonide-formoterol (SYMBICORT) 160-4.5 MCG/ACT inhaler Inhale 2 puffs into the lungs 2 (two) times daily. 1 Inhaler [place], [place] G, DO   chlorpheniramine-HYDROcodone (TUSSIONEX PENNKINETIC ER) 10-8 MG/5ML SUER Take 5 mLs by mouth every 12 (twelve) hours as needed. 115 mL Toni Sams, DO     Controlled Substance Prescriptions [place] Controlled Substance Registry consulted? Not Applicable   Toni Sams, DO 12/31/18 1508

## 2018-12-31 NOTE — Discharge Instructions (Signed)
This is a chronic cough. Chest xray was negative.  Try the cough medication.  Symbicort as prescribed.  I recommend that you see Pulmonology. Give them a call.  Take care  Dr. Adriana Simas

## 2018-12-31 NOTE — ED Triage Notes (Signed)
Patient complains of cough that has been constant since 11/15/2018. Patient states that she was seen on 12/1 and given medications. Patient states that she never turned a corner and is still having a cough, shortness of breath, hoarseness.

## 2019-01-31 ENCOUNTER — Encounter: Payer: Self-pay | Admitting: Pulmonary Disease

## 2019-01-31 ENCOUNTER — Ambulatory Visit (INDEPENDENT_AMBULATORY_CARE_PROVIDER_SITE_OTHER): Payer: 59 | Admitting: Pulmonary Disease

## 2019-01-31 VITALS — BP 130/90 | HR 111 | Ht 63.0 in | Wt 126.4 lb

## 2019-01-31 DIAGNOSIS — R05 Cough: Secondary | ICD-10-CM | POA: Diagnosis not present

## 2019-01-31 DIAGNOSIS — J449 Chronic obstructive pulmonary disease, unspecified: Secondary | ICD-10-CM | POA: Diagnosis not present

## 2019-01-31 DIAGNOSIS — K219 Gastro-esophageal reflux disease without esophagitis: Secondary | ICD-10-CM

## 2019-01-31 DIAGNOSIS — J3089 Other allergic rhinitis: Secondary | ICD-10-CM

## 2019-01-31 DIAGNOSIS — F17210 Nicotine dependence, cigarettes, uncomplicated: Secondary | ICD-10-CM

## 2019-01-31 DIAGNOSIS — R059 Cough, unspecified: Secondary | ICD-10-CM | POA: Insufficient documentation

## 2019-01-31 MED ORDER — FLUTICASONE FUROATE-VILANTEROL 100-25 MCG/INH IN AEPB: 1.0000 | INHALATION_SPRAY | Freq: Every day | RESPIRATORY_TRACT | 6 refills | Status: DC

## 2019-01-31 MED ORDER — FLUTICASONE FUROATE-VILANTEROL 100-25 MCG/INH IN AEPB: 1.0000 | INHALATION_SPRAY | Freq: Every day | RESPIRATORY_TRACT | 0 refills | Status: DC

## 2019-01-31 MED ORDER — OMEPRAZOLE 20 MG PO CPDR: 20.0000 mg | DELAYED_RELEASE_CAPSULE | Freq: Two times a day (BID) | ORAL | 1 refills | Status: DC

## 2019-01-31 MED ORDER — MONTELUKAST SODIUM 10 MG PO TABS: 10.0000 mg | ORAL_TABLET | Freq: Every day | ORAL | 3 refills | Status: DC

## 2019-01-31 NOTE — Patient Instructions (Signed)
1.  We will start Breo Ellipta 100/25, 1 inhalation daily.  This will help with the bronchial reactivity.  I suspect that you have COPD with an asthma overlap.  I do not think that it is serious yet however recommend that you discontinue smoking.  2.  We will start Singulair 10 mg daily (montelukast) 1 tablet daily.  This will be for your nasal and sinus symptoms.  3.  Omeprazole will be given twice a day 20 mg tablet.  This will be a short course just to control potential acid reflux that may be aggravating your hoarseness and cough.  We will see you in follow-up in 3 weeks time.  Call sooner should any new difficulties arise.

## 2019-01-31 NOTE — Progress Notes (Signed)
Patient seen in the office today and instructed on use of Breo 100.  Patient expressed understanding and demonstrated technique. 

## 2019-01-31 NOTE — Progress Notes (Signed)
Subjective:    Patient ID: Toni Mcdonald, female    DOB: [birth year], 58 y.o.   MRN: [id number]  HPI Patient is a 58 year old smoker (1 PPD) who presents for evaluation of a cough that has been present for over 1 month duration.  Patient's primary care physician is Dr. Milinda Antis.  Patient however was referred by urgent care where she was seen on 3 January.  The patient apparently started having issues in November when she noted problems with what appeared to be an upper respiratory infection nauseated with recurrent sinusitis which she is prone to.  She was treated with antibiotics, prednisone and was given an as needed albuterol inhaler.  She states that she never really recovered and continued to have issues with a mostly nonproductive cough which persisted till today.  She notes that hot liquids help but being exposed to cold air or perfumes makes it worse.  On 3 January she presented to the [facility] urgent care and was treated with Symbicort and Tussionex.  She recalls that the Symbicort helped her tremendously but however made her blood pressure go up and with this she would have blurred vision.  She then discontinue the medication and her symptoms have recurred.  She has noted some gastroesophageal reflux symptoms intermittently throughout this episode.  She has had to use Pepcid as needed quite a few times.  She has not had any hemoptysis.  She does not describe orthopnea or paroxysmal nocturnal dyspnea.  Has had some mild ankle dependent edema but this is a chronic issue since she started taking amlodipine approximately 12 years ago.  She is not on ACE inhibitors.  Patient has not had any recent travel outside of the country.  No travel out west.  No exposure to tuberculosis that she is aware of.  He has never been in the [employer].  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   She does not have any exotic pets she has a [place] but the dog is mostly outside.  No unusual hobbies.  No exposure to hot tubs.  Past medical history, surgical history and family history have been reviewed.  They are as noted.  A chest x-ray performed on 3 January showed no infiltrates or masses.  This was independently reviewed.  Review of Systems  Constitutional: Negative.   HENT: Positive for congestion, ear pain, postnasal drip, sore throat, trouble swallowing and voice change.   Eyes: Negative.   Respiratory: Positive for cough.   Cardiovascular: Negative.   Gastrointestinal:       She has heartburn and indigestion frequently..  Endocrine: Negative.   Genitourinary: Negative.   Musculoskeletal: Negative.   Skin: Negative.   Allergic/Immunologic: Negative.   Neurological: Negative.   Hematological: Negative.   Psychiatric/Behavioral: Negative.   All other systems reviewed and are negative.      Objective:   Physical Exam Vitals signs and nursing note reviewed.  Constitutional:      General: She is not in acute distress.    Appearance: She is normal weight. She is not ill-appearing.  HENT:     Head: Normocephalic and atraumatic.     Right Ear: Ear canal and external ear normal. A middle ear effusion (Serous otitis) is present.     Left Ear: Ear canal and external ear normal. A middle ear effusion (Serous otitis) is present.     Nose:  Mucosal edema present.     Right Turbinates: Swollen.     Left Turbinates: Swollen.  Eyes:     Extraocular Movements: Extraocular movements intact.     Pupils: Pupils are equal, round, and reactive to light.  Neck:     Musculoskeletal: Neck supple.     Thyroid: No thyromegaly.     Vascular: No JVD.     Trachea: No tracheal deviation.  Cardiovascular:     Rate and Rhythm: Normal rate and regular rhythm.  No extrasystoles are present.    Chest Wall: PMI is not displaced.     Heart sounds: Normal heart sounds.  Pulmonary:       Effort: Pulmonary effort is normal. No accessory muscle usage.     Breath sounds: Examination of the right-upper field reveals wheezing. Examination of the left-upper field reveals wheezing. Wheezing present. No rhonchi or rales.     Comments: Coarse breath sounds noted. Chest:     Chest wall: No mass, tenderness or crepitus.  Abdominal:     General: Bowel sounds are normal.     Palpations: Abdomen is soft.  Musculoskeletal: Normal range of motion.     Right lower leg: No edema.     Left lower leg: No edema.  Lymphadenopathy:     Cervical: No cervical adenopathy.  Skin:    General: Skin is warm and dry.     Findings: No rash.     Nails: There is no clubbing.   Neurological:     General: No focal deficit present.     Mental Status: She is alert and oriented to person, place, and time.  Psychiatric:        Mood and Affect: Mood normal.    SPIROMETRY: Consistently the patient shows an FEV1 of approximately 1.8 L or 68% predicted.  Mid flows are decreased to about 50 to 57%.  Suspect the patient has COPD/asthma overlap.     Assessment & Plan:   1.  Cough: This is less likely triggered by upper respiratory illness and now the patient may have postinfectious cough aggravated by laryngopharyngeal reflux and postnasal drip (upper airway cough syndrome).  Recommend management as below.  Discontinuation of smoking is also highly recommended.  2.  COPD with asthma overlap syndrome: We will give the patient a trial of Breo Ellipta 1 inhalation daily.  She noticed that her symptoms improved with Symbicort however she was unable to tolerate it due to increased blood pressure.  Hopefully the [facility] will be better tolerated.  She was taught the proper use of the inhaler.  She was also guided with a prescription to her pharmacy.  Was instructed on rinsing her mouth well after use.  We will reassess her in 3 weeks time.  3.  Perennial allergic rhinitis: This may be responsible for her  component of upper airway cough syndrome.  We will start Singulair (montelukast) one 10 mg tablet daily.  3.  Laryngopharyngeal reflux: She has been having worsening luck symptoms and this may very well be linked to her cough therefore will place her on omeprazole 20 mg twice a day this is a short course of no more than 8 weeks on this medication a trial.  4.  Tobacco dependence due to cigarettes: Patient was counseled regards to discontinuation of smoking.  Total counseling time 3 to 5 minutes.  The patient also would qualify for lung cancer screening low dose CT.  We will schedule this after she resolves her current issues.

## 2019-02-21 ENCOUNTER — Encounter: Payer: Self-pay | Admitting: Pulmonary Disease

## 2019-02-21 ENCOUNTER — Ambulatory Visit (INDEPENDENT_AMBULATORY_CARE_PROVIDER_SITE_OTHER): Payer: 59 | Admitting: Pulmonary Disease

## 2019-02-21 VITALS — BP 120/84 | HR 100 | Ht 60.0 in | Wt 126.6 lb

## 2019-02-21 DIAGNOSIS — J3089 Other allergic rhinitis: Secondary | ICD-10-CM

## 2019-02-21 DIAGNOSIS — F17210 Nicotine dependence, cigarettes, uncomplicated: Secondary | ICD-10-CM

## 2019-02-21 DIAGNOSIS — J449 Chronic obstructive pulmonary disease, unspecified: Secondary | ICD-10-CM | POA: Diagnosis not present

## 2019-02-21 DIAGNOSIS — K219 Gastro-esophageal reflux disease without esophagitis: Secondary | ICD-10-CM

## 2019-02-21 MED ORDER — FLUTICASONE FUROATE-VILANTEROL 100-25 MCG/INH IN AEPB: 1.0000 | INHALATION_SPRAY | Freq: Every day | RESPIRATORY_TRACT | 6 refills | Status: DC

## 2019-02-21 MED ORDER — ALBUTEROL SULFATE HFA 108 (90 BASE) MCG/ACT IN AERS: 1.0000 | INHALATION_SPRAY | Freq: Four times a day (QID) | RESPIRATORY_TRACT | 3 refills | Status: DC | PRN

## 2019-02-21 NOTE — Patient Instructions (Signed)
1.  Continue medications as you are currently doing.  2.  We will see him in follow-up in 3 months time with breathing tests done at that time.

## 2019-02-21 NOTE — Progress Notes (Signed)
Subjective:    Patient ID: Toni Mcdonald, female    DOB: Aug 05, 1961, 58 y.o.   MRN: [id number]  HPI Patient is a 58 year old current smoker (1 PPD) who presents for follow-up of cough.  Recall that during her initial visit here on 3 February she had had a cough of over 1 month duration.  The triggering event was around November when she had an upper respiratory infection that was triggered by recurrent sinusitis.  During her initial visit here we instituted therapy with Brio Ellipta 100/25.  The patient has been able to tolerate this well without issues with her blood pressure.  States that her cough has now resolved.  She had also issues with nasal congestion postnasal drip which have been resolved with Singulair.  She also noted reflux and we had to increase her omeprazole to twice a day.  She continues to have reflux issues on occasion but less so than before increasing the dose of omeprazole.  She has not had any fevers, chills or sweats.  Overall she feels well and looks well.  She unfortunately continues to smoke 1 pack of cigarettes per day.  We counseled her today with regards to this issue.   Review of Systems  Constitutional: Negative.   HENT: Negative.   Eyes: Negative.   Respiratory: Negative.   Cardiovascular: Negative.   Gastrointestinal:       Occasional reflux issues better controlled on twice a day PPI  Endocrine: Negative.   Genitourinary: Negative.   Musculoskeletal: Negative.   Skin: Negative.   Allergic/Immunologic: Negative.   Neurological: Negative.   Hematological: Negative.   Psychiatric/Behavioral: Negative.   All other systems reviewed and are negative.      Objective:   Physical Exam Vitals signs and nursing note reviewed.  Constitutional:      General: She is not in acute distress.    Appearance: Normal appearance. She is normal weight. She is not ill-appearing.  HENT:     Head: Normocephalic and atraumatic.     Right Ear: External ear normal.       Left Ear: External ear normal.     Nose: No congestion or rhinorrhea.     Mouth/Throat:     Mouth: Mucous membranes are moist.     Dentition: Abnormal dentition (Poor dentition).     Pharynx: Oropharynx is clear.  Eyes:     General: No scleral icterus.    Conjunctiva/sclera: Conjunctivae normal.     Pupils: Pupils are equal, round, and reactive to light.  Neck:     Musculoskeletal: Neck supple.  Cardiovascular:     Rate and Rhythm: Normal rate and regular rhythm.     Heart sounds: Normal heart sounds.  Pulmonary:     Effort: Pulmonary effort is normal.     Breath sounds: Normal breath sounds. No wheezing or rhonchi.  Abdominal:     General: Abdomen is flat.  Musculoskeletal: Normal range of motion.     Right lower leg: No edema.     Left lower leg: No edema.  Skin:    General: Skin is warm and dry.  Neurological:     General: No focal deficit present.     Mental Status: She is alert and oriented to person, place, and time.  Psychiatric:        Mood and Affect: Mood normal.        Behavior: Behavior normal.           Assessment & Plan:  1.  COPD with asthma overlap syndrome:  She has resolved her exacerbation.  She has no issues with cough.  This has completely resolved.  She is tolerating Breo well without affecting her blood pressure.  Continue Brio Ellipta 100/25 1 inhalation daily.  She will have a refill of her albuterol as needed inhaler.  We will see her in follow-up in 3 months time with full PFTs at that time.  2.  Perennial allergic rhinitis:  Continue Singulair (montelukast) one 10 mg tablet daily.  This is controlling her symptoms well.  3.  Laryngopharyngeal reflux: Overall she is doing better with omeprazole 20 mg twice a day however she continues to have breakthrough symptoms.  She was instructed to take Pepcid at bedtime on days when her reflux is noticeable.  She may need referral to GI for further evaluation of this issue.  4.  Tobacco dependence  due to cigarettes: Patient was counseled with regards to discontinuation of smoking.  Total counseling time 3 to 5 minutes.  The patient also would qualify for lung cancer screening low dose CT.  We will schedule this upon return appointment.

## 2019-02-22 ENCOUNTER — Other Ambulatory Visit: Payer: Self-pay | Admitting: Pulmonary Disease

## 2019-03-07 IMAGING — US US CAROTID DUPLEX BILAT
1 series · 13 of 24 positions shown · non-contrast
Comparison: None.

CLINICAL DATA: Left-sided carotid bruit. History of hypertension
and smoking.

EXAM:
BILATERAL CAROTID DUPLEX ULTRASOUND
TECHNIQUE: Gray scale imaging, color Doppler and duplex ultrasound were
performed of bilateral carotid and vertebral arteries in the neck.

[Series 1: us carotid duplex bilat · 0.06mm/px · 13 of 70 slices shown]
[im 1/70]
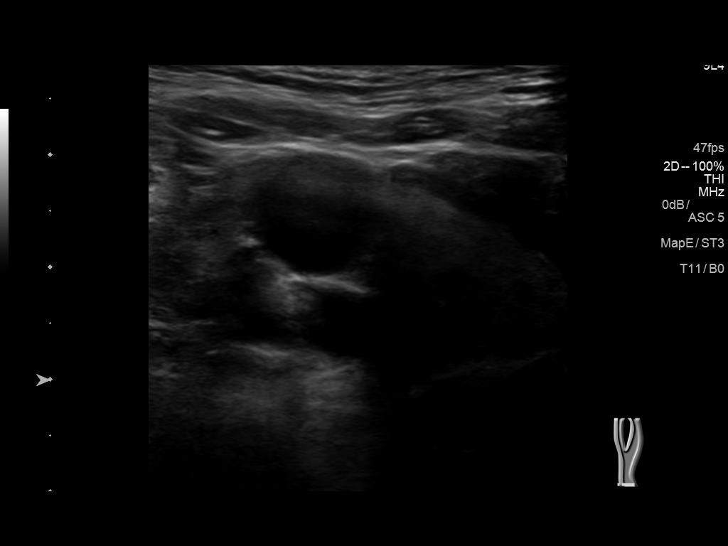
[im 7/70]
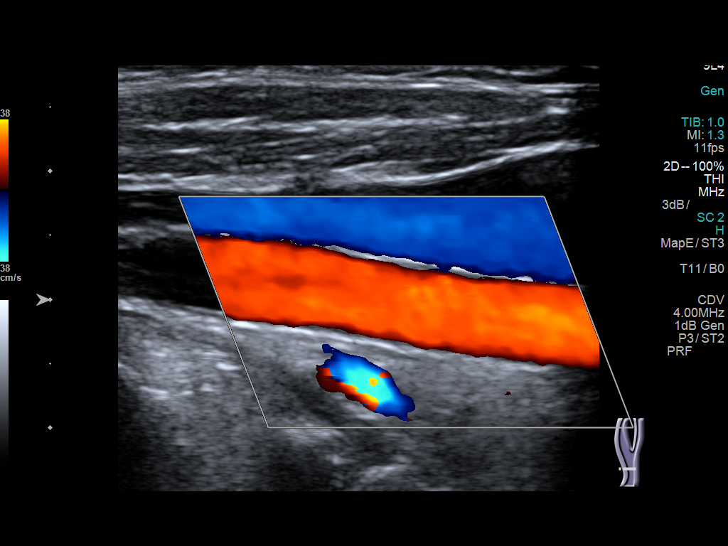
[im 13/70]
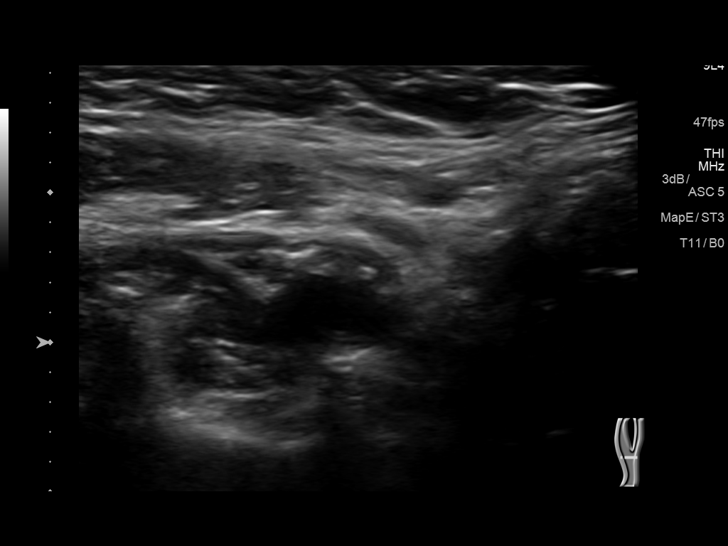
[im 19/70]
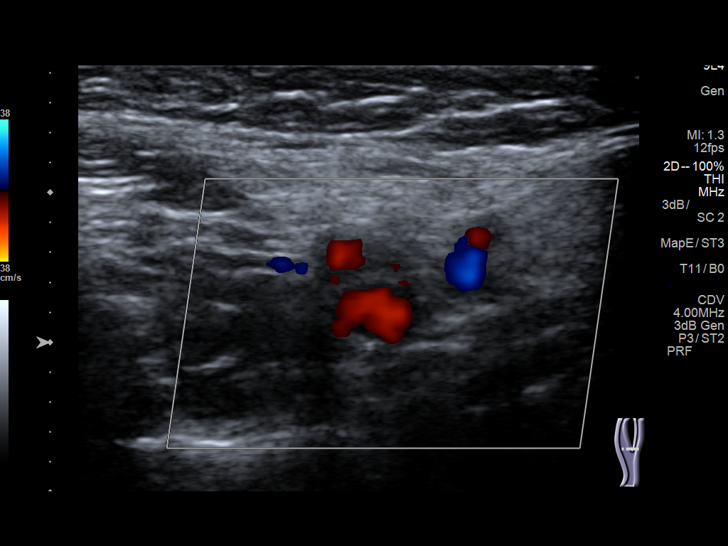
[im 25/70]
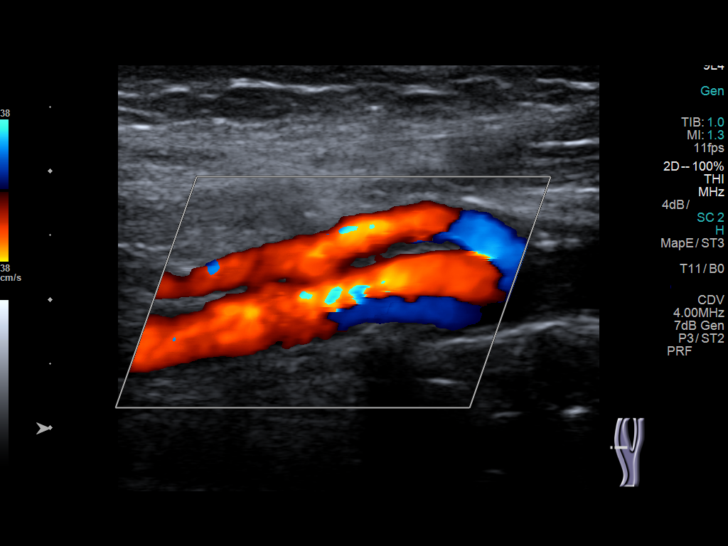
[im 31/70]
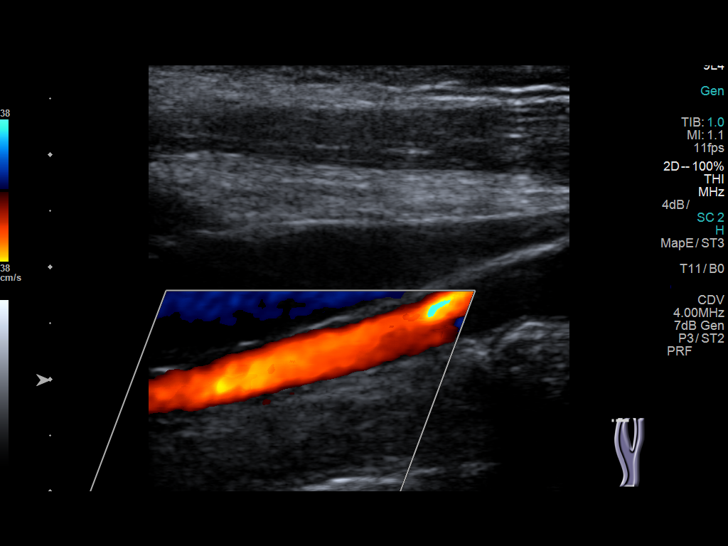
[im 37/70]
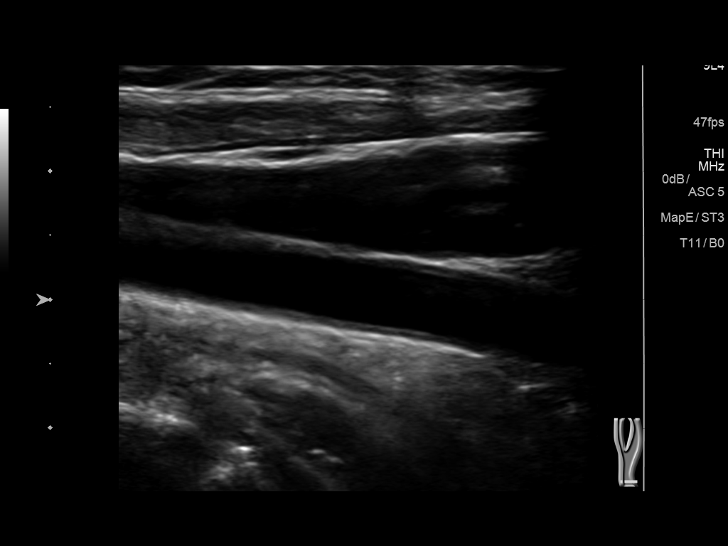
[im 40/70]
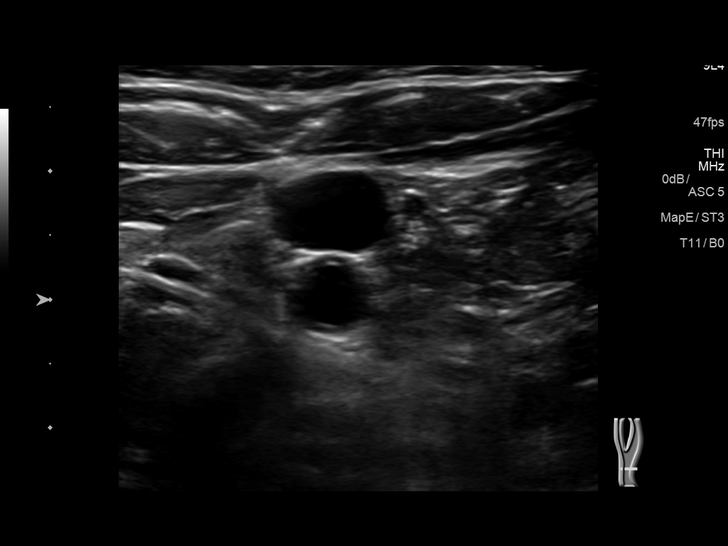
[im 46/70]
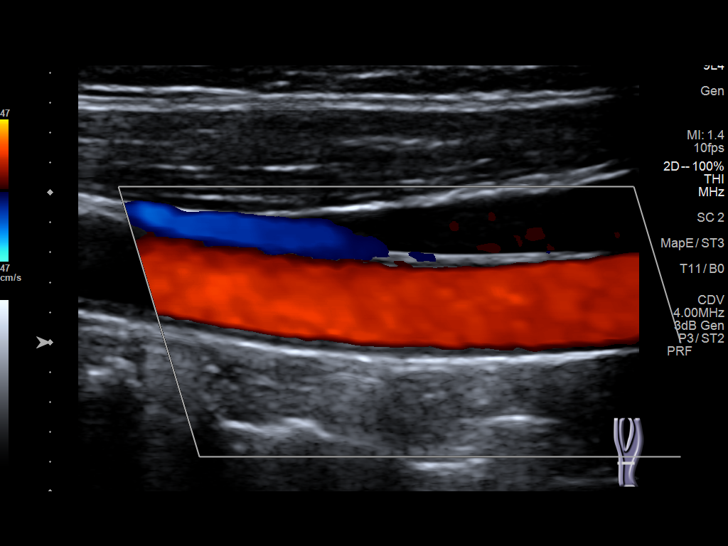
[im 52/70]
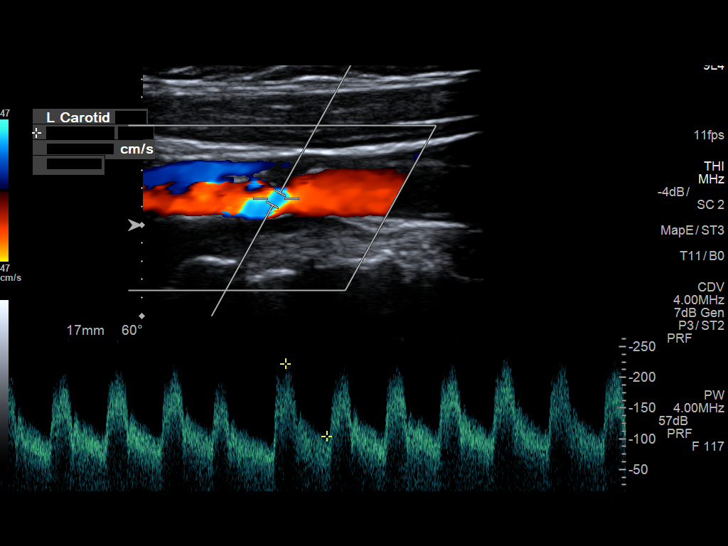
[im 58/70]
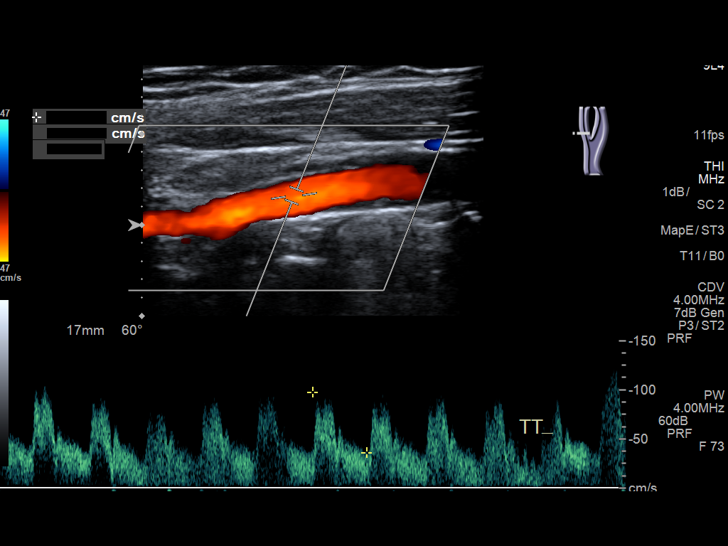
[im 64/70]
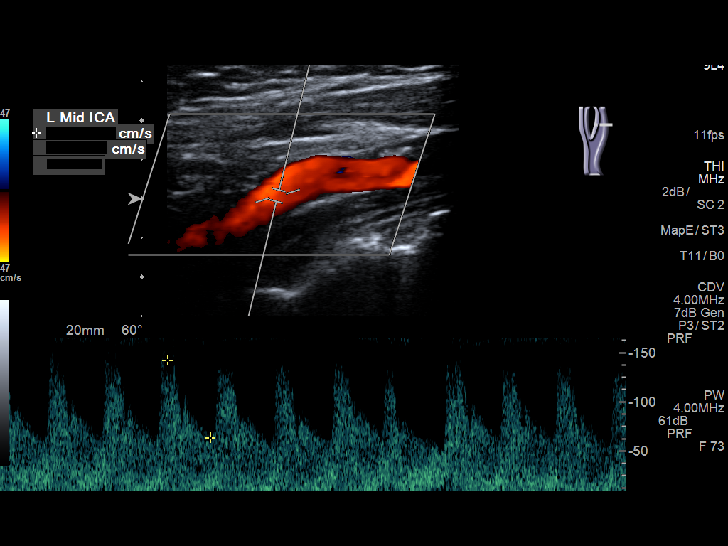
[im 70/70]
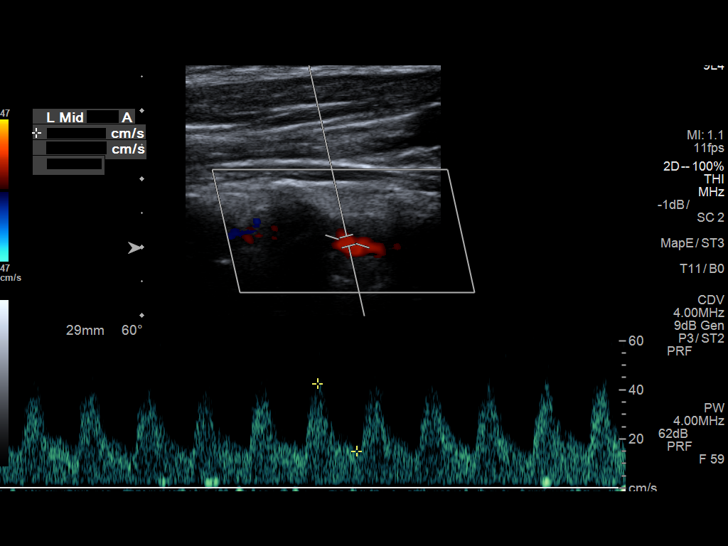

[13 of 24 positions shown; findings below may reference images not displayed]

FINDINGS: Criteria: Quantification of carotid stenosis is based on velocity
parameters that correlate the residual internal carotid diameter
with NASCET-based stenosis levels, using the diameter of the distal
internal carotid lumen as the denominator for stenosis measurement.

The following velocity measurements were obtained:

RIGHT

ICA:  127/59 cm/sec

CCA:  96/39 cm/sec

SYSTOLIC ICA/CCA RATIO:

ECA:  185 cm/sec

LEFT

ICA:  142/60 cm/sec

CCA:  88/31 cm/sec

SYSTOLIC ICA/CCA RATIO:

ECA:  122 cm/sec

RIGHT CAROTID ARTERY: There is a moderate amount of echogenic plaque
within the right carotid bulb (images 15 and 7), extending to
involve the origin and proximal aspects of the right internal
carotid artery (image 26), resulting in borderline elevated peak
systolic velocities within the proximal and distal aspects of the
right internal carotid artery. Greatest acquired peak systolic
velocity within the distal right ICA measures 127 centimeters/second
(image 34).

RIGHT VERTEBRAL ARTERY:  Antegrade flow

LEFT CAROTID ARTERY: There is a moderate to large amount of
echogenic plaque within the left carotid bulb (images 50 and 52),
extending to involve the origin and proximal aspects of the left
internal carotid artery, resulting in elevated peak systolic
velocities within the proximal mid aspects of the left internal
carotid artery. Greatest acquired peak systolic velocity within mid
left ICA measures 142 centimeters/second (image 66).

LEFT VERTEBRAL ARTERY:  Antegrade Flow
IMPRESSION: Moderate to large amount of bilateral atherosclerotic plaque, left
greater than right, results in elevated peak systolic velocities
within bilateral internal carotid arteries compatible with the
50-69% luminal narrowing range bilaterally. Further evaluation with
CTA could be performed as clinically indicated.

## 2019-03-23 ENCOUNTER — Other Ambulatory Visit: Payer: Self-pay | Admitting: Family Medicine

## 2019-03-25 ENCOUNTER — Other Ambulatory Visit: Payer: Self-pay | Admitting: Pulmonary Disease

## 2019-04-08 ENCOUNTER — Other Ambulatory Visit: Payer: Self-pay | Admitting: Family Medicine

## 2019-05-17 ENCOUNTER — Ambulatory Visit: Payer: 59

## 2019-05-19 ENCOUNTER — Telehealth: Payer: Self-pay | Admitting: Pulmonary Disease

## 2019-05-19 NOTE — Telephone Encounter (Signed)
Appt. Rescheduled. Nothing further needed °

## 2019-05-19 NOTE — Telephone Encounter (Signed)
Called pt. To reschedule 05/24/2019 appt. To after PFT appt.

## 2019-05-24 ENCOUNTER — Ambulatory Visit: Payer: 59 | Admitting: Pulmonary Disease

## 2019-06-09 ENCOUNTER — Ambulatory Visit: Payer: 59

## 2019-06-20 ENCOUNTER — Telehealth: Payer: Self-pay | Admitting: Pulmonary Disease

## 2019-06-20 NOTE — Telephone Encounter (Signed)
Breo Ellipta was used because she felt Symbicort caused her blood pressure to go out of control

## 2019-06-20 NOTE — Telephone Encounter (Signed)
carReceived PA request from [facility] for Breo 100. Per pt's plan, it appears that covered alternative is symbicort.   LG please advise. Thanks

## 2019-06-21 NOTE — Telephone Encounter (Signed)
Contacted [employer] to start PA for [facility] and was advised that pt's account has been inactive since 04/2018. Called pt to verify insurance. Pt stated that she has switched to [facility]. Pt stated that this information was given to pharmacy. Called pharmacy is verify that Rx was filed under [place]. [facility] stated that Rx was in fact filed under [place].  Left message for pt to obtain new insurance information.

## 2019-06-21 NOTE — Telephone Encounter (Signed)
[employer] ID [id number]. I have contacted [employer] at [phone] and was placed on hold for >71min. Will call back

## 2019-06-22 ENCOUNTER — Telehealth: Payer: Self-pay | Admitting: Pulmonary Disease

## 2019-06-22 NOTE — Telephone Encounter (Signed)
Called pt to reschedule 06/29/2019 appt. To after PFT appt.

## 2019-06-22 NOTE — Telephone Encounter (Signed)
Breo 100 has been approved for 1 year.  Both patient and pharmacy have been made aware of approval.  Nothing further is needed.

## 2019-06-24 NOTE — Telephone Encounter (Signed)
Spoke with pt and rescheduled appt.. Nothing further needed

## 2019-06-29 ENCOUNTER — Other Ambulatory Visit: Payer: Self-pay

## 2019-06-29 ENCOUNTER — Ambulatory Visit: Payer: 59 | Admitting: Pulmonary Disease

## 2019-07-12 ENCOUNTER — Telehealth: Payer: Self-pay | Admitting: Pulmonary Disease

## 2019-07-12 NOTE — Telephone Encounter (Signed)
Patient is returning phone call.  Patient phone number is [phone].

## 2019-07-12 NOTE — Telephone Encounter (Signed)
Attempted to return call re covid test date and time without success. LMOM for pt to call back.

## 2019-07-12 NOTE — Telephone Encounter (Signed)
Lm to make pt aware of covid test date time.  [facility] thru on 05/30/931 between 12:30-2:30

## 2019-07-13 NOTE — Telephone Encounter (Signed)
Patient returned call re covid testing. Pt stated that she cannot tolerate anything up her nose and refuses to have the COVID test performed. Pt was advised that she cannot have her PFT done without having the COVID testing, pt elected to cancel the PFT.

## 2019-07-13 NOTE — Telephone Encounter (Signed)
Called pt back and informed her that the test for covid has changed, and it does not have to go back as far as before.  Pt stated that she would be willing to try. Pt aware of date/time for covid testing.  Nothing further is needed.

## 2019-07-14 ENCOUNTER — Other Ambulatory Visit: Payer: Self-pay

## 2019-07-14 ENCOUNTER — Other Ambulatory Visit
Admission: RE | Admit: 2019-07-14 | Discharge: 2019-07-14 | Disposition: A | Discharge status: Home / Self Care | Payer: BC Managed Care – PPO | Source: Ambulatory Visit | Attending: Pulmonary Disease | Admitting: Pulmonary Disease

## 2019-07-14 DIAGNOSIS — Z1159 Encounter for screening for other viral diseases: Secondary | ICD-10-CM | POA: Diagnosis not present

## 2019-07-15 LAB — SARS CORONAVIRUS 2 (TAT 6-24 HRS): SARS Coronavirus 2: NEGATIVE

## 2019-07-18 ENCOUNTER — Other Ambulatory Visit: Payer: Self-pay

## 2019-07-18 ENCOUNTER — Ambulatory Visit: Payer: BC Managed Care – PPO | Attending: Pulmonary Disease

## 2019-07-18 DIAGNOSIS — J449 Chronic obstructive pulmonary disease, unspecified: Secondary | ICD-10-CM | POA: Insufficient documentation

## 2019-07-18 MED ORDER — ALBUTEROL SULFATE (2.5 MG/3ML) 0.083% IN NEBU
2.5000 mg | INHALATION_SOLUTION | Freq: Once | RESPIRATORY_TRACT | Status: AC
  Administered 2019-07-18: 2.5 mg via RESPIRATORY_TRACT
  Filled 2019-07-18: qty 3

## 2019-07-20 ENCOUNTER — Telehealth: Payer: Self-pay | Admitting: Pulmonary Disease

## 2019-07-20 DIAGNOSIS — Z1231 Encounter for screening mammogram for malignant neoplasm of breast: Secondary | ICD-10-CM | POA: Diagnosis not present

## 2019-07-20 LAB — HM MAMMOGRAPHY

## 2019-07-20 NOTE — Telephone Encounter (Signed)
Called patient for COVID-19 pre-screening for in office visit. ° °Have you recently traveled any where out of the local area in the last 2 weeks? No ° °Have you been in close contact with a person diagnosed with COVID-19 or someone awaiting results within the last 2 weeks? No ° °Do you currently have any of the following symptoms? If so, when did they start? °Cough     Diarrhea   Joint Pain °Fever      Muscle Pain   Red eyes °Shortness of breath   Abdominal pain  Vomiting °Loss of smell    Rash    Sore Throat °Headache    Weakness   Bruising or bleeding ° ° °Okay to proceed with visit 07/21/2019 °  ° ° °

## 2019-07-21 ENCOUNTER — Other Ambulatory Visit: Payer: Self-pay

## 2019-07-21 ENCOUNTER — Encounter: Payer: Self-pay | Admitting: Pulmonary Disease

## 2019-07-21 ENCOUNTER — Ambulatory Visit (INDEPENDENT_AMBULATORY_CARE_PROVIDER_SITE_OTHER): Payer: BC Managed Care – PPO | Admitting: Pulmonary Disease

## 2019-07-21 VITALS — BP 132/78 | HR 92 | Temp 97.7°F | Ht 60.0 in | Wt 124.2 lb

## 2019-07-21 DIAGNOSIS — F17210 Nicotine dependence, cigarettes, uncomplicated: Secondary | ICD-10-CM

## 2019-07-21 DIAGNOSIS — K219 Gastro-esophageal reflux disease without esophagitis: Secondary | ICD-10-CM | POA: Diagnosis not present

## 2019-07-21 DIAGNOSIS — J3089 Other allergic rhinitis: Secondary | ICD-10-CM | POA: Diagnosis not present

## 2019-07-21 DIAGNOSIS — J449 Chronic obstructive pulmonary disease, unspecified: Secondary | ICD-10-CM | POA: Diagnosis not present

## 2019-07-21 NOTE — Progress Notes (Signed)
Subjective:    Patient ID: Toni Mcdonald, female    DOB: [birth year], 58 y.o.   MRN: [id number]  HPI Patient is a 58 year old current smoker (1 PPD) who presents for follow-up of cough.  Cough is now resolved.  She had had issues with cough of over a month's period of time.  The working diagnosis is COPD with asthma overlap.  She has been on [facility] 10/25 1 inhalation daily.  She is tolerating this well.  She also has been on Singulair for perennial rhinitis issues.  Issues are worsened by gastroesophageal reflux with laryngopharyngeal reflux this is now well controlled with omeprazole.  She had pulmonary function testing on 20 July which were essentially normal however, she had increase in airway resistance and mild diffusion capacity impairment.  This likely is due to mild COPD.  She does have a significant asthmatic component but this has been well controlled therefore likely not to manifest on PFTs. The patient did state that during the testing she did have some tachycardia and tremulousness after albuterol nebulization treatment.  She does not experience this with the albuterol inhaler.  I suspect that had to be dose related.  The patient also notes that she has had issues where she has had some urticaria and discoloration of the skin this this appears to be hypopigmentation following excoriations from scratching.  I have recommended she see a dermatologist states that she not get an appointment until September.  Review of Systems  Constitutional: Negative.   HENT: Positive for postnasal drip. Negative for rhinorrhea and sinus pressure.   Eyes: Negative.   Respiratory: Negative.  Negative for cough.   Cardiovascular: Positive for chest pain (Had after nebulizer treatment but has not recurred.).  Gastrointestinal: Negative.        GERD symptoms controlled.  Endocrine: Negative.   Genitourinary: Negative.   Musculoskeletal: Negative.   Skin:       Pruritus, hypopigmentation   Neurological: Negative.   Hematological: Negative.   Psychiatric/Behavioral: Negative.   All other systems reviewed and are negative.      Objective:   Physical Exam Vitals signs and nursing note reviewed.  Constitutional:      General: She is not in acute distress.    Appearance: Normal appearance. She is normal weight. She is not ill-appearing.  HENT:     Head: Normocephalic and atraumatic.     Nose:     Comments: Nose/mouth/throat not examined due to masking requirements for COVID 19. Eyes:     General: No scleral icterus.    Conjunctiva/sclera: Conjunctivae normal.     Pupils: Pupils are equal, round, and reactive to light.  Neck:     Musculoskeletal: Neck supple.  Cardiovascular:     Rate and Rhythm: Normal rate and regular rhythm.     Heart sounds: Normal heart sounds.  Pulmonary:     Effort: Pulmonary effort is normal.     Breath sounds: Normal breath sounds. No wheezing or rhonchi.  Abdominal:     General: Abdomen is flat.  Musculoskeletal: Normal range of motion.     Right lower leg: No edema.     Left lower leg: No edema.  Skin:    General: Skin is warm and dry.  Neurological:     General: No focal deficit present.     Mental Status: She is alert and oriented to person, place, and time.  Psychiatric:        Mood and Affect: Mood normal.  Behavior: Behavior normal.           Assessment & Plan:   1. COPD with asthma overlap syndrome:  This appears to be mild in nature.  She is to continue [employer] PacificBreo Ellipta 1 inhalation daily.  2. Perennial allergic rhinitis:  Given her issues with urticaria followed by skin discoloration.  Will discontinue Singulair.  She is to use Claritin or Allegra for rhinitis symptoms as needed.  3. Laryngopharyngeal reflux:  Overall better controlled with omeprazole twice a day.  4. Tobacco dependence due to cigarettes: Patient was counseled with regards to discontinuation of smoking. Total counseling time 3 to 5  minutes. The patient also would qualify for lung cancer screening low dose CT. We will send the referral for lung cancer screening.   This chart was dictated using voice recognition software/Dragon.  Despite best efforts to proofread, errors can occur which can change the meaning.  Any change was purely unintentional.

## 2019-07-21 NOTE — Patient Instructions (Signed)
1.  We are discontinuing Singulair.  2.  If your skin issues continue recommend that he see dermatology.  3.  If you have issues with allergies recommend Claritin or Allegra over-the-counter.  4.  We will see you in follow-up in 4 months time.  Call sooner should any new difficulties arise.

## 2019-07-22 ENCOUNTER — Encounter: Payer: Self-pay | Admitting: *Deleted

## 2019-07-25 ENCOUNTER — Encounter: Payer: Self-pay | Admitting: Family Medicine

## 2019-07-25 ENCOUNTER — Encounter: Payer: 59 | Admitting: Physician Assistant

## 2019-07-25 ENCOUNTER — Ambulatory Visit (INDEPENDENT_AMBULATORY_CARE_PROVIDER_SITE_OTHER): Payer: BC Managed Care – PPO | Admitting: Family Medicine

## 2019-07-25 VITALS — BP 132/70 | HR 84 | Temp 98.0°F | Resp 14 | Ht 60.0 in | Wt 124.0 lb

## 2019-07-25 DIAGNOSIS — I10 Essential (primary) hypertension: Secondary | ICD-10-CM

## 2019-07-25 DIAGNOSIS — K219 Gastro-esophageal reflux disease without esophagitis: Secondary | ICD-10-CM | POA: Diagnosis not present

## 2019-07-25 DIAGNOSIS — E7800 Pure hypercholesterolemia, unspecified: Secondary | ICD-10-CM | POA: Diagnosis not present

## 2019-07-25 DIAGNOSIS — L819 Disorder of pigmentation, unspecified: Secondary | ICD-10-CM

## 2019-07-25 DIAGNOSIS — Z0001 Encounter for general adult medical examination with abnormal findings: Secondary | ICD-10-CM | POA: Diagnosis not present

## 2019-07-25 DIAGNOSIS — Z124 Encounter for screening for malignant neoplasm of cervix: Secondary | ICD-10-CM

## 2019-07-25 DIAGNOSIS — R87610 Atypical squamous cells of undetermined significance on cytologic smear of cervix (ASC-US): Secondary | ICD-10-CM | POA: Diagnosis not present

## 2019-07-25 DIAGNOSIS — E559 Vitamin D deficiency, unspecified: Secondary | ICD-10-CM | POA: Diagnosis not present

## 2019-07-25 DIAGNOSIS — F17200 Nicotine dependence, unspecified, uncomplicated: Secondary | ICD-10-CM | POA: Diagnosis not present

## 2019-07-25 DIAGNOSIS — I6523 Occlusion and stenosis of bilateral carotid arteries: Secondary | ICD-10-CM

## 2019-07-25 DIAGNOSIS — I779 Disorder of arteries and arterioles, unspecified: Secondary | ICD-10-CM | POA: Insufficient documentation

## 2019-07-25 DIAGNOSIS — Z0000 Encounter for general adult medical examination without abnormal findings: Secondary | ICD-10-CM

## 2019-07-25 DIAGNOSIS — L309 Dermatitis, unspecified: Secondary | ICD-10-CM

## 2019-07-25 MED ORDER — OMEPRAZOLE 20 MG PO CPDR: 20.0000 mg | DELAYED_RELEASE_CAPSULE | Freq: Every day | ORAL | 2 refills | Status: DC

## 2019-07-25 MED ORDER — AMLODIPINE BESYLATE 5 MG PO TABS: 5.0000 mg | ORAL_TABLET | Freq: Every day | ORAL | 3 refills | Status: DC

## 2019-07-25 MED ORDER — NICOTINE 14 MG/24HR TD PT24: 14.0000 mg | MEDICATED_PATCH | Freq: Every day | TRANSDERMAL | 1 refills | Status: DC

## 2019-07-25 MED ORDER — DIALYVITE VITAMIN D 5000 125 MCG (5000 UT) PO CAPS: 5000.0000 [IU] | ORAL_CAPSULE | Freq: Every day | ORAL | Status: DC

## 2019-07-25 NOTE — Assessment & Plan Note (Signed)
Well controlled no changes 

## 2019-07-25 NOTE — Patient Instructions (Addendum)
Take nicoderm patch 14mg  for 2 months, then 7 mg for 1 month  We will call with lab results  [facility]- Dr. Donnetta Hutching  Office Tel [phone] F/U 6 months

## 2019-07-25 NOTE — Assessment & Plan Note (Signed)
F/u [facility] pt to call and schedule appt Check lipids Did not tolerate statins

## 2019-07-25 NOTE — Assessment & Plan Note (Signed)
Discussed tobacco cessation,s he feels high dose nicotine patches make her crave them more Will try mid dose 14mg  for 2 months then taper to 7mg   Did not tolerate oral meds

## 2019-07-25 NOTE — Assessment & Plan Note (Signed)
Continue  omeprazole once a day

## 2019-07-25 NOTE — Progress Notes (Signed)
Subjective:    Patient ID: Toni Mcdonald, female    DOB: Jul 16, 1961, 58 y.o.   MRN: [id number]  Patient presents for Gynecologic Exam (is fasting)  Pt here for CPE, medications reviewed COPD with asthma- using Breo and albuterol prn, followed by allergy/asthma- Dr. Patsey Berthold, had PFT recently. She was sick for almost 3 months straight with URI symptoms, chronic cough that would not improve  smoking 1ppd     REflux found-  Omeprazole 20mg  once a day, helps control symptoms    Has had episodes of pruritic  rash on and off onlegs and arms now on trunk, leaves hypopigmented spots, , not sure if it was the singulair , just stopped wed night . For allergies using claritin or allegra at that time  drainage has been better off singulair   Note did have similar rash back in 2019 on legs, thought to be sun poisoning but nothing this severe    Colonoscopy-  UTD 2017   Mammogram  7/22 normal     PAP Smear- Due, menses stopped due to partial supracervical hystrectomy 2004    Immunizations- TDAP UTD / PNA    Declines shingrix this year   HTN- taking norvasc without diffiulty, BP has been controlled   Carotid artery occlusion 60% noted on scan in 2019, seen by vascular , she stopped lipitor after taking for 1-2 months, feels like she couldn't remember anything and felt cloudy headed also had myalgias , then given pravstatin but states this caused the same problem    Systane for eyes, has eye appointment coming up   Review Of Systems:  GEN- denies fatigue, fever, weight loss,weakness, recent illness HEENT- denies eye drainage, change in vision, nasal discharge, CVS- denies chest pain, palpitations RESP- denies SOB, cough, wheeze ABD- denies N/V, change in stools, abd pain GU- denies dysuria, hematuria, dribbling, incontinence MSK- denies joint pain, muscle aches, injury Neuro- denies headache, dizziness, syncope, seizure activity       Objective:    BP 132/70   Pulse 84   Temp  98 F (36.7 C) (Oral)   Resp 14   Ht 5' (1.524 m)   Wt 124 lb (56.2 kg)   SpO2 99%   BMI 24.22 kg/m  GEN- NAD, alert and oriented x3 HEENT- PERRL, EOMI, non injected sclera, pink conjunctiva, MMM, oropharynx clear Neck- Supple, no thyromegaly CVS- RRR, no murmur RESP-CTAB Breast- Deferrred mammo last week  ABD-NABS,soft,NT,ND GU- normal external genitalia, vaginal mucosa pink and moist- atrophy, cervix visualized no growth, no blood form os, minimal thin clear discharge, no CMT, no ovarian masses, supracervical hysterectomy EXT- No edema Skin- multiple hypopigmented lesions on feet, large area on shins, scattered on arms, chest ,upper back Pulses- Radial, DP- 2+    Fall/Audit C/Depression screen negative     Assessment & Plan:      Problem List Items Addressed This Visit      Unprioritized   Carotid arterial disease (HCC)    F/u [facility] pt to call and schedule appt Check lipids Did not tolerate statins      Relevant Medications   amLODipine (NORVASC) 5 MG tablet   Essential hypertension, benign    Well controlled no changes       Relevant Medications   amLODipine (NORVASC) 5 MG tablet   Other Relevant Orders   CBC with Differential/Platelet   Comprehensive metabolic panel   Hyperlipidemia   Relevant Medications   amLODipine (NORVASC) 5 MG tablet   Other Relevant Orders  Lipid panel   Laryngopharyngeal reflux (LPR)    Continue omeprazole once a day       Smoker   Vitamin D deficiency    Discussed tobacco cessation,s he feels high dose nicotine patches make her crave them more Will try mid dose 14mg  for 2 months then taper to 7mg   Did not tolerate oral meds       Relevant Orders   Vitamin D, 25-hydroxy    Other Visit Diagnoses    Routine general medical examination at a health care facility    -  Primary   CPE done, declines shingrix today, PAP Smear done   Cervical cancer screening       Relevant Orders   Pap IG w/ reflex to HPV when ASC-U    Hypopigmentation       Relevant Orders   Ambulatory referral to Dermatology   Dermatitis       Relevant Orders   Ambulatory referral to Dermatology      Note: This dictation was prepared with Dragon dictation along with smaller phrase technology. Any transcriptional errors that result from this process are unintentional.

## 2019-07-26 LAB — LIPID PANEL
Cholesterol: 227 mg/dL — ABNORMAL HIGH (ref <200)
HDL: 76 mg/dL (ref >=50)
LDL Cholesterol (Calc): 131 mg/dL (calc) — ABNORMAL HIGH
Non-HDL Cholesterol (Calc): 151 mg/dL (calc) — ABNORMAL HIGH (ref <130)
Total CHOL/HDL Ratio: 3 (calc) (ref <5.0)
Triglycerides: 101 mg/dL (ref <150)

## 2019-07-26 LAB — CBC WITH DIFFERENTIAL/PLATELET
Absolute Monocytes: 707 cells/uL (ref 200–950)
Basophils Absolute: 125 cells/uL (ref 0–200)
Basophils Relative: 1.1 %
Eosinophils Absolute: 46 cells/uL (ref 15–500)
Eosinophils Relative: 0.4 %
HCT: 45.2 % — ABNORMAL HIGH (ref 35.0–45.0)
Hemoglobin: 15 g/dL (ref 11.7–15.5)
Lymphs Abs: 1881 cells/uL (ref 850–3900)
MCH: 29.6 pg (ref 27.0–33.0)
MCHC: 33.2 g/dL (ref 32.0–36.0)
MCV: 89.2 fL (ref 80.0–100.0)
MPV: 10.2 fL (ref 7.5–12.5)
Monocytes Relative: 6.2 %
Neutro Abs: 8641 cells/uL — ABNORMAL HIGH (ref 1500–7800)
Neutrophils Relative %: 75.8 %
Platelets: 495 10*3/uL — ABNORMAL HIGH (ref 140–400)
RBC: 5.07 10*6/uL (ref 3.80–5.10)
RDW: 13.7 % (ref 11.0–15.0)
Total Lymphocyte: 16.5 %
WBC: 11.4 10*3/uL — ABNORMAL HIGH (ref 3.8–10.8)

## 2019-07-26 LAB — COMPREHENSIVE METABOLIC PANEL
AG Ratio: 1.9 (calc) (ref 1.0–2.5)
ALT: 18 U/L (ref 6–29)
AST: 21 U/L (ref 10–35)
Albumin: 4.5 g/dL (ref 3.6–5.1)
Alkaline phosphatase (APISO): 127 U/L (ref 37–153)
BUN: 9 mg/dL (ref 7–25)
CO2: 24 mmol/L (ref 20–32)
Calcium: 9.8 mg/dL (ref 8.6–10.4)
Chloride: 105 mmol/L (ref 98–110)
Creat: 0.79 mg/dL (ref 0.50–1.05)
Globulin: 2.4 g/dL (calc) (ref 1.9–3.7)
Glucose, Bld: 91 mg/dL (ref 65–99)
Potassium: 4.4 mmol/L (ref 3.5–5.3)
Sodium: 140 mmol/L (ref 135–146)
Total Bilirubin: 0.3 mg/dL (ref 0.2–1.2)
Total Protein: 6.9 g/dL (ref 6.1–8.1)

## 2019-07-26 LAB — VITAMIN D 25 HYDROXY (VIT D DEFICIENCY, FRACTURES): Vit D, 25-Hydroxy: 62 ng/mL (ref 30–100)

## 2019-07-28 LAB — PAP IG W/ RFLX HPV ASCU

## 2019-07-28 LAB — HUMAN PAPILLOMAVIRUS, HIGH RISK: HPV DNA High Risk: NOT DETECTED

## 2019-08-06 DIAGNOSIS — F17210 Nicotine dependence, cigarettes, uncomplicated: Secondary | ICD-10-CM | POA: Diagnosis not present

## 2019-08-06 DIAGNOSIS — X501XXA Overexertion from prolonged static or awkward postures, initial encounter: Secondary | ICD-10-CM | POA: Diagnosis not present

## 2019-08-06 DIAGNOSIS — S99921A Unspecified injury of right foot, initial encounter: Secondary | ICD-10-CM | POA: Diagnosis not present

## 2019-08-06 DIAGNOSIS — S93491A Sprain of other ligament of right ankle, initial encounter: Secondary | ICD-10-CM | POA: Diagnosis not present

## 2019-08-06 DIAGNOSIS — Y92832 Beach as the place of occurrence of the external cause: Secondary | ICD-10-CM | POA: Diagnosis not present

## 2019-08-06 DIAGNOSIS — M79671 Pain in right foot: Secondary | ICD-10-CM | POA: Diagnosis not present

## 2019-08-06 DIAGNOSIS — Y9319 Activity, other involving water and watercraft: Secondary | ICD-10-CM | POA: Diagnosis not present

## 2019-08-06 DIAGNOSIS — J449 Chronic obstructive pulmonary disease, unspecified: Secondary | ICD-10-CM | POA: Diagnosis not present

## 2019-08-06 DIAGNOSIS — I10 Essential (primary) hypertension: Secondary | ICD-10-CM | POA: Diagnosis not present

## 2019-08-06 DIAGNOSIS — S93401A Sprain of unspecified ligament of right ankle, initial encounter: Secondary | ICD-10-CM | POA: Diagnosis not present

## 2019-08-12 DIAGNOSIS — M79671 Pain in right foot: Secondary | ICD-10-CM | POA: Diagnosis not present

## 2019-08-26 DIAGNOSIS — M25471 Effusion, right ankle: Secondary | ICD-10-CM | POA: Diagnosis not present

## 2019-08-26 DIAGNOSIS — S93421D Sprain of deltoid ligament of right ankle, subsequent encounter: Secondary | ICD-10-CM | POA: Diagnosis not present

## 2019-08-26 DIAGNOSIS — M25571 Pain in right ankle and joints of right foot: Secondary | ICD-10-CM | POA: Diagnosis not present

## 2019-08-26 DIAGNOSIS — M76829 Posterior tibial tendinitis, unspecified leg: Secondary | ICD-10-CM | POA: Diagnosis not present

## 2019-09-08 DIAGNOSIS — L80 Vitiligo: Secondary | ICD-10-CM | POA: Diagnosis not present

## 2019-09-08 DIAGNOSIS — R21 Rash and other nonspecific skin eruption: Secondary | ICD-10-CM | POA: Diagnosis not present

## 2019-09-12 DIAGNOSIS — L80 Vitiligo: Secondary | ICD-10-CM | POA: Diagnosis not present

## 2019-09-14 DIAGNOSIS — L80 Vitiligo: Secondary | ICD-10-CM | POA: Diagnosis not present

## 2019-09-16 DIAGNOSIS — L80 Vitiligo: Secondary | ICD-10-CM | POA: Diagnosis not present

## 2019-09-19 DIAGNOSIS — L80 Vitiligo: Secondary | ICD-10-CM | POA: Diagnosis not present

## 2019-09-21 DIAGNOSIS — L80 Vitiligo: Secondary | ICD-10-CM | POA: Diagnosis not present

## 2019-09-23 DIAGNOSIS — L80 Vitiligo: Secondary | ICD-10-CM | POA: Diagnosis not present

## 2019-09-28 DIAGNOSIS — M25474 Effusion, right foot: Secondary | ICD-10-CM | POA: Diagnosis not present

## 2019-09-28 DIAGNOSIS — R262 Difficulty in walking, not elsewhere classified: Secondary | ICD-10-CM | POA: Diagnosis not present

## 2019-09-28 DIAGNOSIS — M7671 Peroneal tendinitis, right leg: Secondary | ICD-10-CM | POA: Diagnosis not present

## 2019-09-28 DIAGNOSIS — S93421D Sprain of deltoid ligament of right ankle, subsequent encounter: Secondary | ICD-10-CM | POA: Diagnosis not present

## 2019-10-12 DIAGNOSIS — D485 Neoplasm of uncertain behavior of skin: Secondary | ICD-10-CM | POA: Diagnosis not present

## 2019-10-12 DIAGNOSIS — L80 Vitiligo: Secondary | ICD-10-CM | POA: Diagnosis not present

## 2019-10-19 DIAGNOSIS — L80 Vitiligo: Secondary | ICD-10-CM | POA: Diagnosis not present

## 2019-12-01 DIAGNOSIS — L80 Vitiligo: Secondary | ICD-10-CM | POA: Diagnosis not present

## 2019-12-06 ENCOUNTER — Other Ambulatory Visit: Payer: Self-pay

## 2019-12-06 DIAGNOSIS — Z20822 Contact with and (suspected) exposure to covid-19: Secondary | ICD-10-CM

## 2019-12-07 LAB — NOVEL CORONAVIRUS, NAA: SARS-CoV-2, NAA: NOT DETECTED

## 2020-01-22 DIAGNOSIS — Z20822 Contact with and (suspected) exposure to covid-19: Secondary | ICD-10-CM | POA: Diagnosis not present

## 2020-01-22 DIAGNOSIS — H9201 Otalgia, right ear: Secondary | ICD-10-CM | POA: Diagnosis not present

## 2020-01-22 DIAGNOSIS — J019 Acute sinusitis, unspecified: Secondary | ICD-10-CM | POA: Diagnosis not present

## 2020-01-27 ENCOUNTER — Ambulatory Visit: Payer: BC Managed Care – PPO | Admitting: Family Medicine

## 2020-03-01 DIAGNOSIS — L80 Vitiligo: Secondary | ICD-10-CM | POA: Diagnosis not present

## 2020-03-18 ENCOUNTER — Other Ambulatory Visit: Payer: Self-pay | Admitting: Pulmonary Disease

## 2020-04-20 ENCOUNTER — Other Ambulatory Visit: Payer: Self-pay | Admitting: Pulmonary Disease

## 2020-05-17 ENCOUNTER — Ambulatory Visit (INDEPENDENT_AMBULATORY_CARE_PROVIDER_SITE_OTHER): Payer: BC Managed Care – PPO | Admitting: Nurse Practitioner

## 2020-05-17 ENCOUNTER — Other Ambulatory Visit: Payer: Self-pay

## 2020-05-17 VITALS — BP 120/84 | HR 96 | Temp 97.6°F | Resp 18 | Wt 125.4 lb

## 2020-05-17 DIAGNOSIS — B9789 Other viral agents as the cause of diseases classified elsewhere: Secondary | ICD-10-CM

## 2020-05-17 DIAGNOSIS — J0100 Acute maxillary sinusitis, unspecified: Secondary | ICD-10-CM

## 2020-05-17 DIAGNOSIS — J988 Other specified respiratory disorders: Secondary | ICD-10-CM

## 2020-05-17 MED ORDER — LORATADINE 10 MG PO TABS: 10.0000 mg | ORAL_TABLET | Freq: Every day | ORAL | 11 refills | Status: DC

## 2020-05-17 MED ORDER — BUDESONIDE 32 MCG/ACT NA SUSP: NASAL | 0 refills | Status: AC

## 2020-05-17 MED ORDER — DOXYCYCLINE HYCLATE 100 MG PO TABS: 100.0000 mg | ORAL_TABLET | Freq: Two times a day (BID) | ORAL | 0 refills | Status: DC

## 2020-05-17 NOTE — Patient Instructions (Addendum)
Your symptoms are consistent with a sinus infection. I will prescribe you an antibiotic also a daily Claritin and Flonase alternative. Get plenty of rest, drink plenty of water, may take over-the-counter medications to relieve symptoms, follow-up for nonresolving or worsening symptoms, take all prescribed medications as directed.  Covid test completed in clinic today  Make appointment with your primary care provider as is overdue.

## 2020-05-17 NOTE — Progress Notes (Signed)
Acute Office Visit  Subjective:    Patient ID: Toni Mcdonald, female    DOB: 24-Jul-1961, 59 y.o.   MRN: [id number]  Chief Complaint  Patient presents with  . Sinus Problem    L side of facial pressure and pain, started 05/18, sinus tylenol, sudafed was taken    HPI Patient is a 59 year old female presenting for symptoms of sinus pressure mostly in the maxillary region and more on her left side.  The symptoms started on Tuesday.  She reports that 6 weeks ago she was seen in urgent care for similar symptoms she was treated with allergy medications.  Her symptoms did not improve but never resolved. Is a current cigarette smoker.  Patient does report that there are several persons at work have been tested for Covid recently.  She has not received Covid vaccine.  Patient reports allergy to azithromycin of diarrhea.  Discussed that this is a side effect.  She has not tried treatment for her symptoms.  She denies fever, chills, loss of taste, loss of smell, headache, cough, malaise.  Her last visit with her primary care in this clinic was in July 2020 directions were to follow-up in 6 months.  Her last lab work was completed at that time. Past Medical History:  Diagnosis Date  . Anxiety   . Arthritis   . Bronchitis    hx of  . Chronic neck pain   . GERD (gastroesophageal reflux disease)   . Headache(784.0)    migraines  . Hypertension    controlled by diet and exercise  . Pneumonia    hx of    Past Surgical History:  Procedure Laterality Date  . ABDOMINAL HYSTERECTOMY     Has both ovaries  . ANTERIOR CERVICAL DECOMP/DISCECTOMY FUSION  09/22/2012   Procedure: ANTERIOR CERVICAL DECOMPRESSION/DISCECTOMY FUSION 3 LEVELS;  Surgeon: Tia Alert, MD;  Location: [facility];  Service: Neurosurgery;  Laterality: N/A;  Anterior Cervical Diskectomy/Fusion with Plate, Cervical four through seven  . CARDIOVASCULAR STRESS TEST     2008  . COLONOSCOPY WITH PROPOFOL N/A 07/10/2016   Procedure: COLONOSCOPY WITH PROPOFOL;  Surgeon: Midge Minium, MD;  Location: [facility];  Service: Endoscopy;  Laterality: N/A;  . DIAGNOSTIC LAPAROSCOPY     exploratory surgery due to "scarring"  . TUBAL LIGATION      Family History  Problem Relation Age of Onset  . Arthritis Mother   . Heart disease Mother   . Hyperlipidemia Mother   . Hypertension Mother   . Stroke Mother   . Heart disease Father   . Hyperlipidemia Father   . Hypertension Father   . Kidney disease Father   . Stroke Father   . Thyroid disease Sister   . Thyroid disease Sister   . Thyroid disease Sister   . Vision loss Maternal Grandfather   . Early death Brother   . Heart disease Brother 59       Died at 58 with MI  . Heart attack Brother     Social History   Socioeconomic History  . Marital status: Married    Spouse name: Not on file  . Number of children: Not on file  . Years of education: Not on file  . Highest education level: Not on file  Occupational History  . Not on file  Tobacco Use  . Smoking status: Current Every Day Smoker    Packs/day: 1.00    Years: 35.00    Pack years: 35.00  Types: Cigarettes  . Smokeless tobacco: Never Used  . Tobacco comment: pack per day 02.24.20  Substance and Sexual Activity  . Alcohol use: Yes    Alcohol/week: 4.0 standard drinks    Types: 4 Glasses of wine per week    Comment: occasional  . Drug use: No  . Sexual activity: Yes  Other Topics Concern  . Not on file  Social History Narrative   Married.    2 children. 6 grandchildren   Works as Production designer, theatre/television/film at [employer]--On her feet, walking all day at work   No other exercise   Social Determinants of Corporate investment banker Strain:   . Difficulty of Paying Living Expenses:   Food Insecurity:   . Worried About Programme researcher, broadcasting/film/video in the Last Year:   . Barista in the Last Year:   Transportation Needs:   . Freight forwarder (Medical):   Marland Kitchen Lack of Transportation  (Non-Medical):   Physical Activity:   . Days of Exercise per Week:   . Minutes of Exercise per Session:   Stress:   . Feeling of Stress :   Social Connections:   . Frequency of Communication with Friends and Family:   . Frequency of Social Gatherings with Friends and Family:   . Attends Religious Services:   . Active Member of Clubs or Organizations:   . Attends Banker Meetings:   Marland Kitchen Marital Status:   Intimate Partner Violence:   . Fear of Current or Ex-Partner:   . Emotionally Abused:   Marland Kitchen Physically Abused:   . Sexually Abused:     Outpatient Medications Prior to Visit  Medication Sig Dispense Refill  . albuterol (PROVENTIL HFA;VENTOLIN HFA) 108 (90 Base) MCG/ACT inhaler Inhale 1-2 puffs into the lungs every 6 (six) hours as needed for wheezing or shortness of breath. 3 Inhaler 3  . amLODipine (NORVASC) 5 MG tablet Take 1 tablet (5 mg total) by mouth daily. 90 tablet 3  . BREO ELLIPTA 100-25 MCG/INH AEPB TAKE 1 PUFF BY MOUTH EVERY DAY 60 each 0  . omeprazole (PRILOSEC) 20 MG capsule TAKE 1 CAPSULE BY MOUTH TWICE A DAY 60 capsule 2  . Propylene Glycol (SYSTANE BALANCE) 0.6 % SOLN Place 2 drops into both eyes daily.    . Cholecalciferol (DIALYVITE VITAMIN D 5000) 125 MCG (5000 UT) capsule Take 1 capsule (5,000 Units total) by mouth daily. (Patient not taking: Reported on 05/17/2020)    . nicotine (NICODERM CQ) 14 mg/24hr patch Place 1 patch (14 mg total) onto the skin daily. 28 patch 1  . omeprazole (PRILOSEC) 20 MG capsule Take 1 capsule (20 mg total) by mouth daily. 90 capsule 2   No facility-administered medications prior to visit.    Allergies  Allergen Reactions  . Penicillins Anaphylaxis    "whelps"  . Azithromycin Diarrhea    GI Upset   . Codeine Itching and Swelling    "skin crawls'  . Neosporin [Neomycin-Bacitracin Zn-Polymyx]     "blisters the skin"  . Percocet [Oxycodone-Acetaminophen]     Nausea and vomiting  . Statins Other (See Comments)     Mental status changes, myalgias  . Sulfamethoxazole-Trimethoprim   . Wellbutrin [Bupropion] Hives    Review of Systems  All other systems reviewed and are negative.      Objective:    Physical Exam Vitals and nursing note reviewed.  Constitutional:      General: She is not in acute distress.  Appearance: Normal appearance. She is not ill-appearing.  HENT:     Head: Normocephalic.     Right Ear: Hearing, ear canal and external ear normal.     Left Ear: Hearing, ear canal and external ear normal.     Nose: Congestion and rhinorrhea present.     Right Sinus: Maxillary sinus tenderness present.     Left Sinus: Maxillary sinus tenderness present.     Mouth/Throat:     Lips: Pink.     Mouth: Mucous membranes are moist.     Pharynx: Oropharynx is clear. Uvula midline. No oropharyngeal exudate or posterior oropharyngeal erythema.  Eyes:     General: Lids are normal. Lids are everted, no foreign bodies appreciated.     Extraocular Movements: Extraocular movements intact.     Right eye: Normal extraocular motion and no nystagmus.     Left eye: Normal extraocular motion and no nystagmus.     Conjunctiva/sclera: Conjunctivae normal.     Pupils: Pupils are equal, round, and reactive to light.  Neck:     Vascular: No carotid bruit.  Cardiovascular:     Rate and Rhythm: Normal rate and regular rhythm.     Pulses: Normal pulses.     Heart sounds: Normal heart sounds.  Pulmonary:     Effort: Pulmonary effort is normal.     Breath sounds: Normal breath sounds.  Abdominal:     General: Abdomen is flat. Bowel sounds are normal.     Palpations: Abdomen is soft.  Musculoskeletal:     Cervical back: Normal range of motion and neck supple.     Right lower leg: No edema.     Left lower leg: No edema.  Lymphadenopathy:     Cervical: No cervical adenopathy.  Skin:    General: Skin is warm and dry.     Capillary Refill: Capillary refill takes less than 2 seconds.     Findings: No  erythema or rash.  Neurological:     General: No focal deficit present.     Mental Status: She is alert and oriented to person, place, and time.  Psychiatric:        Attention and Perception: Attention normal.        Mood and Affect: Mood normal.        Speech: Speech normal.        Behavior: Behavior normal.        Thought Content: Thought content normal.        Cognition and Memory: Cognition normal.        Judgment: Judgment normal.     BP 120/84 (BP Location: Left Arm, Patient Position: Sitting, Cuff Size: Normal)   Pulse 96   Temp 97.6 F (36.4 C) (Temporal)   Resp 18   Wt 125 lb 6.4 oz (56.9 kg)   SpO2 97%   BMI 24.49 kg/m  Wt Readings from Last 3 Encounters:  05/17/20 125 lb 6.4 oz (56.9 kg)  07/25/19 124 lb (56.2 kg)  07/21/19 124 lb 3.2 oz (56.3 kg)    Health Maintenance Due  Topic Date Due  . COVID-19 Vaccine (1) Never done    There are no preventive care reminders to display for this patient.   Lab Results  Component Value Date   TSH 0.63 07/19/2018   Lab Results  Component Value Date   WBC 11.4 (H) 07/25/2019   HGB 15.0 07/25/2019   HCT 45.2 (H) 07/25/2019   MCV 89.2 07/25/2019   PLT 495 (  H) 07/25/2019   Lab Results  Component Value Date   NA 140 07/25/2019   K 4.4 07/25/2019   CO2 24 07/25/2019   GLUCOSE 91 07/25/2019   BUN 9 07/25/2019   CREATININE 0.79 07/25/2019   BILITOT 0.3 07/25/2019   ALKPHOS 107 08/15/2015   AST 21 07/25/2019   ALT 18 07/25/2019   PROT 6.9 07/25/2019   ALBUMIN 4.3 08/15/2015   CALCIUM 9.8 07/25/2019   ANIONGAP 6 02/18/2016   Lab Results  Component Value Date   CHOL 227 (H) 07/25/2019   Lab Results  Component Value Date   HDL 76 07/25/2019   Lab Results  Component Value Date   LDLCALC 131 (H) 07/25/2019   Lab Results  Component Value Date   TRIG 101 07/25/2019   Lab Results  Component Value Date   CHOLHDL 3.0 07/25/2019   No results found for: HGBA1C     Assessment & Plan:  Your symptoms  are consistent with a sinus infection I will prescribe you an antibiotic also a daily Claritin and Flonase alternative. Get plenty of rest, drink plenty of water, may take over-the-counter medications to relieve symptoms, follow-up for nonresolving or worsening symptoms, take all prescribed medications as directed.  Covid test completed in clinic today  Make appointment with your primary care provider as is overdue. Problem List Items Addressed This Visit    None    Visit Diagnoses    Acute non-recurrent maxillary sinusitis    -  Primary   Relevant Medications   budesonide (RHINOCORT AQUA) 32 MCG/ACT nasal spray   loratadine (CLARITIN) 10 MG tablet   doxycycline (VIBRA-TABS) 100 MG tablet   Viral respiratory illness       Relevant Orders   SARS-COV-2 RNA,(COVID-19) QUAL NAAT       Follow Up:  As needed for worsening or non resolving symptoms   Annie Main, FNP

## 2020-05-18 LAB — SARS-COV-2 RNA,(COVID-19) QUALITATIVE NAAT: SARS CoV2 RNA: NOT DETECTED

## 2020-08-09 ENCOUNTER — Other Ambulatory Visit: Payer: Self-pay | Admitting: Family Medicine

## 2020-09-06 ENCOUNTER — Ambulatory Visit: Payer: BC Managed Care – PPO | Admitting: Dermatology

## 2020-11-22 IMAGING — CR DG CHEST 2V
2 series · 2 of 2 positions shown · non-contrast
Comparison: 02/18/2016

CLINICAL DATA: Cough and hoarseness

EXAM:
CHEST - 2 VIEW

[chest pa]
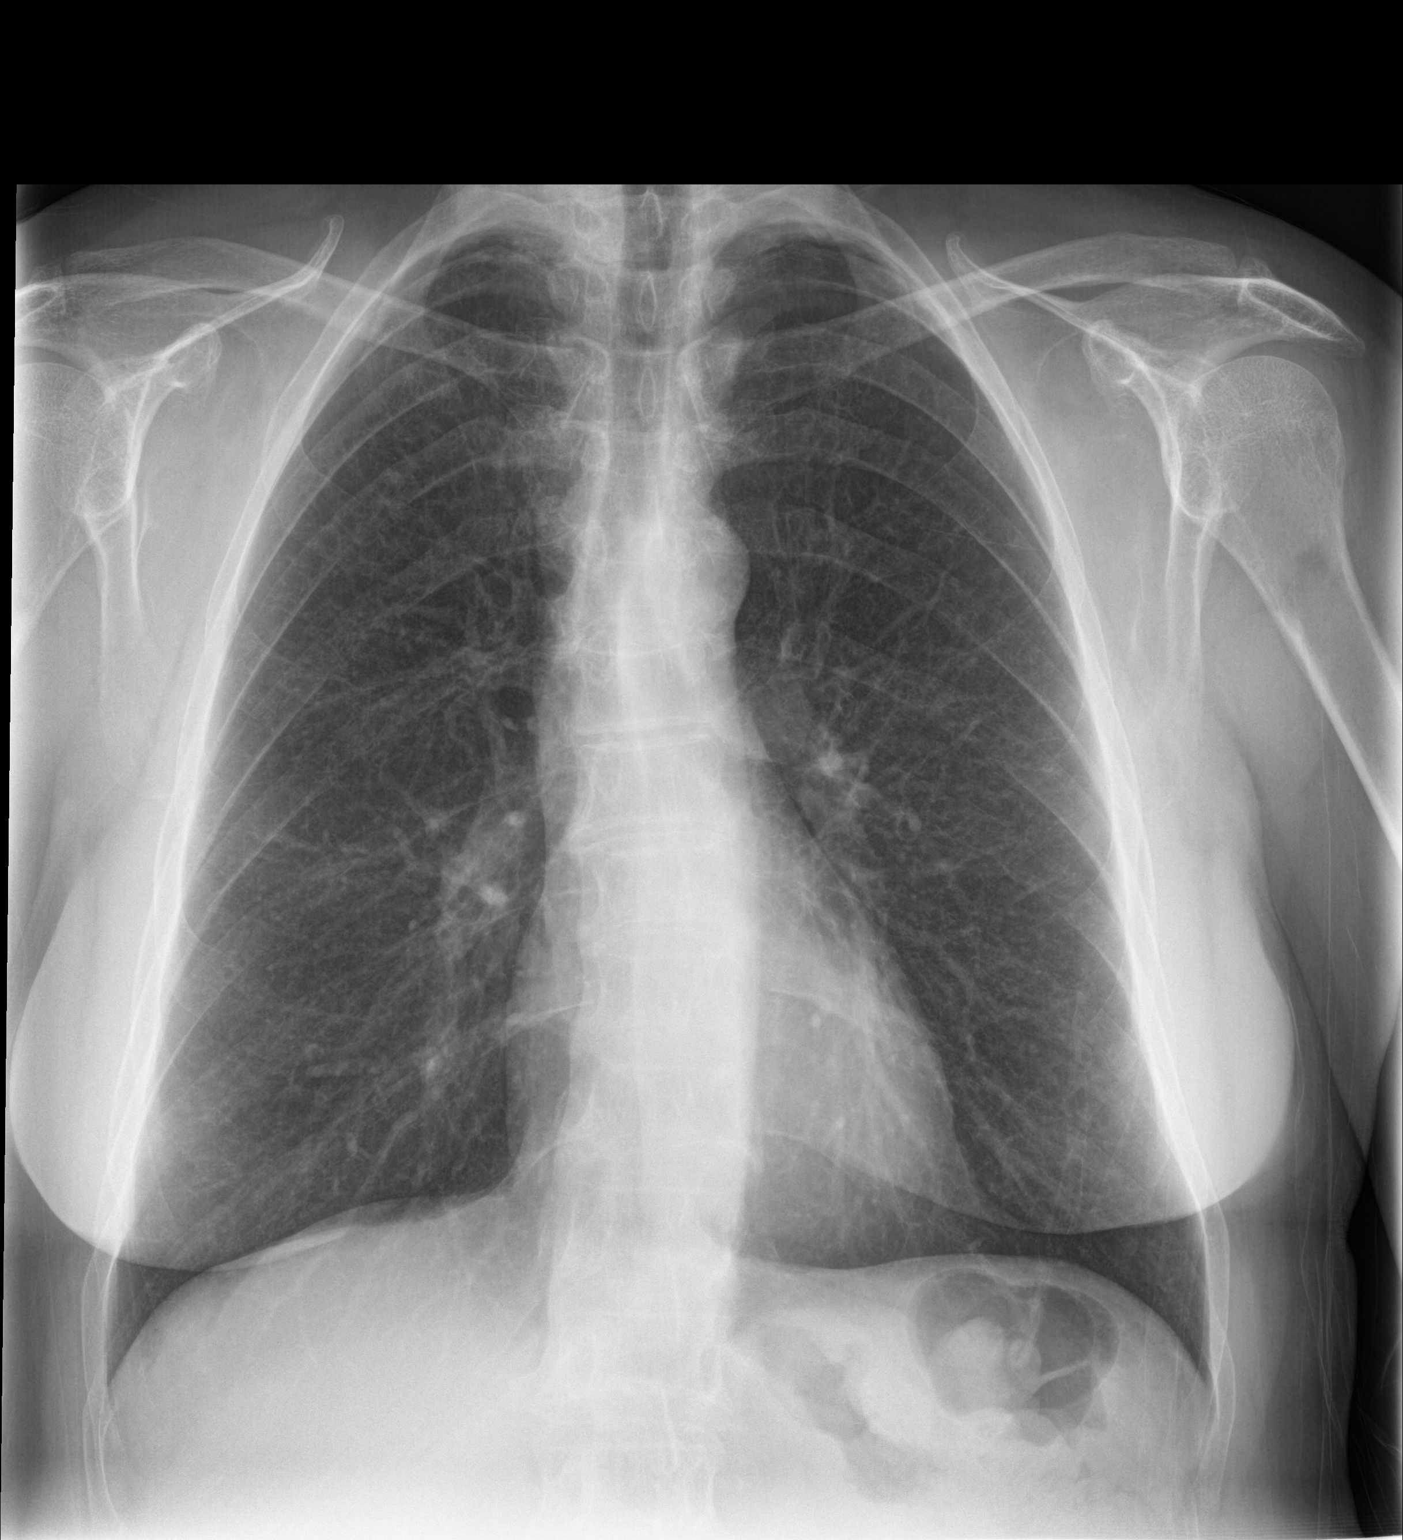

[chest lat]
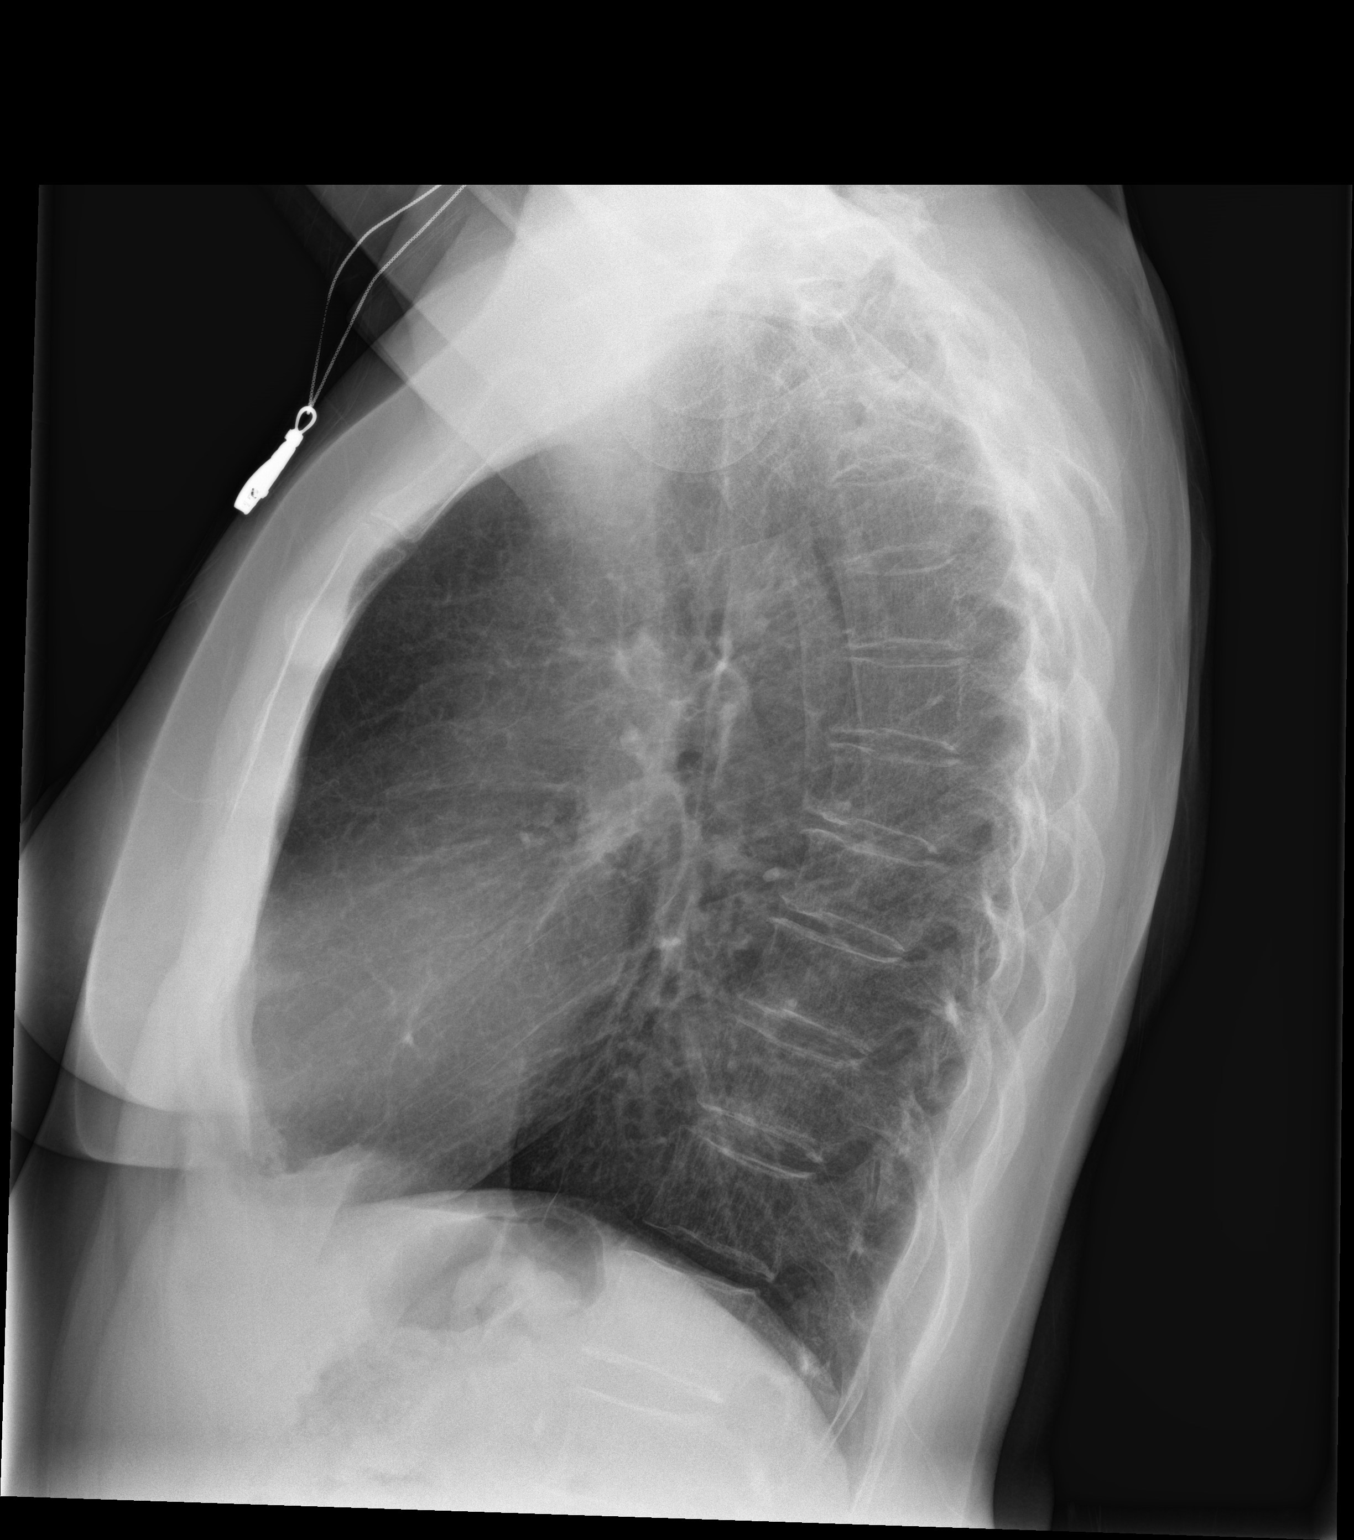

[2 of 2 positions shown; findings below may reference images not displayed]

FINDINGS: The heart size and mediastinal contours are within normal limits.
Both lungs are clear. The visualized skeletal structures are
unremarkable. Postsurgical changes in the cervical spine are noted.
IMPRESSION: No active cardiopulmonary disease.

## 2021-02-19 ENCOUNTER — Encounter: Payer: Self-pay | Admitting: Nurse Practitioner

## 2021-02-19 ENCOUNTER — Other Ambulatory Visit: Payer: Self-pay

## 2021-02-19 ENCOUNTER — Ambulatory Visit (INDEPENDENT_AMBULATORY_CARE_PROVIDER_SITE_OTHER): Payer: BC Managed Care – PPO | Admitting: Nurse Practitioner

## 2021-02-19 VITALS — BP 118/80 | HR 89 | Temp 97.8°F | Ht 60.0 in | Wt 115.2 lb

## 2021-02-19 DIAGNOSIS — Z1231 Encounter for screening mammogram for malignant neoplasm of breast: Secondary | ICD-10-CM | POA: Diagnosis not present

## 2021-02-19 DIAGNOSIS — K219 Gastro-esophageal reflux disease without esophagitis: Secondary | ICD-10-CM | POA: Diagnosis not present

## 2021-02-19 DIAGNOSIS — I10 Essential (primary) hypertension: Secondary | ICD-10-CM

## 2021-02-19 DIAGNOSIS — J3089 Other allergic rhinitis: Secondary | ICD-10-CM

## 2021-02-19 DIAGNOSIS — Z0000 Encounter for general adult medical examination without abnormal findings: Secondary | ICD-10-CM

## 2021-02-19 DIAGNOSIS — Z0001 Encounter for general adult medical examination with abnormal findings: Secondary | ICD-10-CM | POA: Diagnosis not present

## 2021-02-19 DIAGNOSIS — J449 Chronic obstructive pulmonary disease, unspecified: Secondary | ICD-10-CM | POA: Diagnosis not present

## 2021-02-19 DIAGNOSIS — J4489 Other specified chronic obstructive pulmonary disease: Secondary | ICD-10-CM

## 2021-02-19 DIAGNOSIS — E559 Vitamin D deficiency, unspecified: Secondary | ICD-10-CM | POA: Diagnosis not present

## 2021-02-19 DIAGNOSIS — E7800 Pure hypercholesterolemia, unspecified: Secondary | ICD-10-CM | POA: Diagnosis not present

## 2021-02-19 DIAGNOSIS — F17200 Nicotine dependence, unspecified, uncomplicated: Secondary | ICD-10-CM

## 2021-02-19 DIAGNOSIS — I6523 Occlusion and stenosis of bilateral carotid arteries: Secondary | ICD-10-CM

## 2021-02-19 LAB — COMPLETE METABOLIC PANEL WITH GFR: BUN: 8 mg/dL (ref 7–25)

## 2021-02-19 MED ORDER — CETIRIZINE HCL 10 MG PO TABS: 10.0000 mg | ORAL_TABLET | Freq: Every day | ORAL | 11 refills | Status: DC

## 2021-02-19 MED ORDER — OMEPRAZOLE 20 MG PO CPDR: 20.0000 mg | DELAYED_RELEASE_CAPSULE | Freq: Every day | ORAL | 1 refills | Status: DC

## 2021-02-19 NOTE — Assessment & Plan Note (Signed)
Chronic, ongoing.  Willing to try alternative to loratadine - will try cetirizine.  Continue rhinocort.  Return to clinic if symptoms remain uncontrolled.

## 2021-02-19 NOTE — Assessment & Plan Note (Signed)
Chronic, ongoing.  With history of reported osteopenia - would likely benefit from continuous replacement of Vitamin D.  Will check Vitamin D level today for baseline; encouraged patient to resume OTC Vitamin D in meantime.  Likely would benefit from repeat DEXA scan in future.

## 2021-02-19 NOTE — Assessment & Plan Note (Signed)
Chronic, stable.  BP controlled today in clinic.  Continue amlodipine for now.  CBC, CMP checked today.  DASH diet encouraged.  Follow up in 6 months.

## 2021-02-19 NOTE — Assessment & Plan Note (Signed)
Chronic, stable.  Continue omeprazole 20 mg daily.  Magnesium and CBC checked today.  Return to clinic with breakthrough symptoms.

## 2021-02-19 NOTE — Assessment & Plan Note (Signed)
Discussed complete cessation, patient is not ready to quit at this time.

## 2021-02-19 NOTE — Assessment & Plan Note (Addendum)
Chronic, ongoing.  Does not tolerate statins.  Discussed other options such as Zetia and Repatha, patient wishes to consider these options for now.  Would also likely benefit from antiplatelet.  Lipids checked today, patient not fasting.  Referral placed to Vascular for repeat carotid ultrasound.  Smoking cessation encouraged, patient reports not ready to quit.

## 2021-02-19 NOTE — Progress Notes (Signed)
BP 118/80   Pulse 89   Temp 97.8 F (36.6 C)   Ht 5' (1.524 m)   Wt 115 lb 3.2 oz (52.3 kg)   SpO2 98%   BMI 22.50 kg/m    Subjective:    Patient ID: Toni Mcdonald, female    DOB: [birth year], 60 y.o.   MRN: [id number]  HPI: Toni Mcdonald is a 60 y.o. female presenting on 02/19/2021 for comprehensive medical examination.  Patient is not fasting. Current medical complaints include:  GERD GERD control status: controlled; knows triggers and tries to avoid them Satisfied with current treatment? yes Heartburn frequency: rarely Medication side effects: no  Medication compliance: exellent Previous GERD medications: omeprazole Antacid use frequency: never Alleviatiating factors: taking omeprazole daily Aggravating factors: missing dose, certain foods Dysphagia: yes Odynophagia:  yes Hematemesis: no Blood in stool: no EGD: yes; 2004  HYPERTENSION / HYPERLIPIDEMIA Satisfied with current treatment? yes Duration of hypertension: chronic BP monitoring frequency: not checking BP medication side effects: no Past BP meds: amlodipine 5 mg Duration of hyperlipidemia: chronic Cholesterol medication side effects: yes Cholesterol supplements: none Past cholesterol medications: atorvastatin Aspirin: no Recent stressors: no Recurrent headaches: no Visual changes: no Palpitations: no Dyspnea: no Chest pain: no Lower extremity edema: no Dizzy/lightheaded: no  She currently lives with: husband; 1 dog LMP: partial hysterectomy 2004; ovaries remain  Depression Screen done today and results listed below:  Depression screen [facility] 2/9 07/25/2019 07/21/2018 08/22/2016 08/15/2015 07/27/2014  Decreased Interest 0 0 0 0 1  Down, Depressed, Hopeless 0 0 0 0 1  PHQ - 2 Score 0 0 0 0 2  Altered sleeping - - 0 - 1  Tired, decreased energy - - 0 - 2  Change in appetite - - 0 - 1  Feeling bad or failure about yourself  - - 0 - 0  Trouble concentrating - - 0 - 1  Moving slowly or  fidgety/restless - - 0 - 0  Suicidal thoughts - - 0 - 0  PHQ-9 Score - - 0 - 7    The patient does not have a history of falls. I did not complete a risk assessment for falls. A plan of care for falls was not documented.   Past Medical History:  Past Medical History:  Diagnosis Date  . Anxiety   . Arthritis   . Asthma    Phreesia 02/18/2021  . Bronchitis    hx of  . Chronic neck pain   . GERD (gastroesophageal reflux disease)   . Headache(784.0)    migraines  . Hypertension    controlled by diet and exercise  . Pneumonia    hx of    Surgical History:  Past Surgical History:  Procedure Laterality Date  . ABDOMINAL HYSTERECTOMY     Has both ovaries  . ANTERIOR CERVICAL DECOMP/DISCECTOMY FUSION  09/22/2012   Procedure: ANTERIOR CERVICAL DECOMPRESSION/DISCECTOMY FUSION 3 LEVELS;  Surgeon: Tia Alert, MD;  Location: [facility];  Service: Neurosurgery;  Laterality: N/A;  Anterior Cervical Diskectomy/Fusion with Plate, Cervical four through seven  . CARDIOVASCULAR STRESS TEST     2008  . COLONOSCOPY WITH PROPOFOL N/A 07/10/2016   Procedure: COLONOSCOPY WITH PROPOFOL;  Surgeon: Midge Minium, MD;  Location: [facility];  Service: Endoscopy;  Laterality: N/A;  . DIAGNOSTIC LAPAROSCOPY     exploratory surgery due to "scarring"  . TUBAL LIGATION      Medications:  Current Outpatient Medications on File Prior to Visit  Medication Sig  .  albuterol (PROVENTIL HFA;VENTOLIN HFA) 108 (90 Base) MCG/ACT inhaler Inhale 1-2 puffs into the lungs every 6 (six) hours as needed for wheezing or shortness of breath.  Marland Kitchen amLODipine (NORVASC) 5 MG tablet TAKE 1 TABLET BY MOUTH EVERY DAY  . BREO ELLIPTA 100-25 MCG/INH AEPB TAKE 1 PUFF BY MOUTH EVERY DAY  . budesonide (RHINOCORT AQUA) 32 MCG/ACT nasal spray EACH NARE  . Cholecalciferol (DIALYVITE VITAMIN D 5000) 125 MCG (5000 UT) capsule Take 1 capsule (5,000 Units total) by mouth daily.  Marland Kitchen Propylene Glycol 0.6 % SOLN Place 2 drops into  both eyes daily.   No current facility-administered medications on file prior to visit.    Allergies:  Allergies  Allergen Reactions  . Other Diarrhea and Rash  . Penicillins Anaphylaxis    "whelps"  . Erythromycin   . Azithromycin Diarrhea    GI Upset   . Codeine Itching and Swelling    "skin crawls'  . Neosporin [Neomycin-Bacitracin Zn-Polymyx]     "blisters the skin"  . Percocet [Oxycodone-Acetaminophen]     Nausea and vomiting  . Statins Other (See Comments)    Mental status changes, myalgias  . Sulfamethoxazole-Trimethoprim   . Wellbutrin [Bupropion] Hives    Social History:  Social History   Socioeconomic History  . Marital status: Married    Spouse name: Not on file  . Number of children: Not on file  . Years of education: Not on file  . Highest education level: Not on file  Occupational History  . Not on file  Tobacco Use  . Smoking status: Current Every Day Smoker    Packs/day: 1.00    Years: 35.00    Pack years: 35.00    Types: Cigarettes  . Smokeless tobacco: Never Used  . Tobacco comment: pack per day 02.24.20  Vaping Use  . Vaping Use: Not on file  Substance and Sexual Activity  . Alcohol use: Yes    Alcohol/week: 4.0 standard drinks    Types: 4 Glasses of wine per week    Comment: occasional  . Drug use: No  . Sexual activity: Yes  Other Topics Concern  . Not on file  Social History Narrative   Married.    2 children. 6 grandchildren   Works as Production designer, theatre/television/film at [employer]--On her feet, walking all day at work   No other exercise   Social Determinants of Corporate investment banker Strain: Not on file  Food Insecurity: Not on file  Transportation Needs: Not on file  Physical Activity: Not on file  Stress: Not on file  Social Connections: Not on file  Intimate Partner Violence: Not on file   Social History   Tobacco Use  Smoking Status Current Every Day Smoker  . Packs/day: 1.00  . Years: 35.00  . Pack years: 35.00  . Types:  Cigarettes  Smokeless Tobacco Never Used  Tobacco Comment   pack per day 02.24.20   Social History   Substance and Sexual Activity  Alcohol Use Yes  . Alcohol/week: 4.0 standard drinks  . Types: 4 Glasses of wine per week   Comment: occasional    Family History:  Family History  Problem Relation Age of Onset  . Arthritis Mother   . Heart disease Mother   . Hyperlipidemia Mother   . Hypertension Mother   . Stroke Mother   . Heart disease Father   . Hyperlipidemia Father   . Hypertension Father   . Kidney disease Father   . Stroke Father   .  Thyroid disease Sister   . Thyroid disease Sister   . Thyroid disease Sister   . Vision loss Maternal Grandfather   . Early death Brother   . Heart disease Brother 51       Died at 2 with MI  . Heart attack Brother     Past medical history, surgical history, medications, allergies, family history and social history reviewed with patient today and changes made to appropriate areas of the chart.   Review of Systems  Constitutional: Negative.   HENT: Negative.   Eyes: Negative.   Respiratory: Negative.   Cardiovascular: Negative.   Gastrointestinal: Negative.   Genitourinary: Negative.   Musculoskeletal: Negative.   Skin: Negative.   Neurological: Negative.   Psychiatric/Behavioral: Negative.        Objective:    BP 118/80   Pulse 89   Temp 97.8 F (36.6 C)   Ht 5' (1.524 m)   Wt 115 lb 3.2 oz (52.3 kg)   SpO2 98%   BMI 22.50 kg/m   Wt Readings from Last 3 Encounters:  02/19/21 115 lb 3.2 oz (52.3 kg)  05/17/20 125 lb 6.4 oz (56.9 kg)  07/25/19 124 lb (56.2 kg)    Physical Exam Vitals and nursing note reviewed.  Constitutional:      General: She is not in acute distress.    Appearance: Normal appearance. She is normal weight. She is not ill-appearing or toxic-appearing.  HENT:     Head: Normocephalic and atraumatic.     Right Ear: Tympanic membrane, ear canal and external ear normal.     Left Ear: Tympanic  membrane, ear canal and external ear normal.     Nose: Nose normal. No congestion or rhinorrhea.     Mouth/Throat:     Mouth: Mucous membranes are moist.     Dentition: Abnormal dentition. Dental caries present. No dental abscesses.     Tongue: No lesions.     Pharynx: Oropharynx is clear. No oropharyngeal exudate.  Eyes:     General: No scleral icterus.    Extraocular Movements: Extraocular movements intact.     Pupils: Pupils are equal, round, and reactive to light.  Neck:     Vascular: Carotid bruit (left side) present.  Cardiovascular:     Rate and Rhythm: Normal rate and regular rhythm.     Pulses: Normal pulses.     Heart sounds: Normal heart sounds. No murmur heard.   Pulmonary:     Effort: Pulmonary effort is normal. No respiratory distress.     Breath sounds: No wheezing or rhonchi.  Chest:  Breasts:     Right: Normal. No inverted nipple, mass, nipple discharge or skin change.     Left: Normal. No inverted nipple, mass, nipple discharge or skin change.    Abdominal:     General: Abdomen is flat. Bowel sounds are normal. There is no distension.     Palpations: Abdomen is soft.     Tenderness: There is no abdominal tenderness.  Genitourinary:    Comments: Patient declined gynecologic examination Musculoskeletal:        General: No swelling or tenderness. Normal range of motion.     Cervical back: Normal range of motion and neck supple. No rigidity or tenderness.     Right lower leg: No edema.     Left lower leg: No edema.  Skin:    General: Skin is warm and dry.     Capillary Refill: Capillary refill takes less than 2 seconds.  Coloration: Skin is not jaundiced or pale.  Neurological:     General: No focal deficit present.     Mental Status: She is alert and oriented to person, place, and time.     Motor: No weakness.     Gait: Gait normal.  Psychiatric:        Mood and Affect: Mood normal.        Behavior: Behavior normal.        Thought Content: Thought  content normal.        Judgment: Judgment normal.     Results for orders placed or performed in visit on 05/17/20  SARS-COV-2 RNA,(COVID-19) QUAL NAAT   Specimen: Respiratory  Result Value Ref Range   SARS CoV2 RNA NOT DETECTED NOT DETECT      Assessment & Plan:   Problem List Items Addressed This Visit      Cardiovascular and Mediastinum   Carotid arterial disease (HCC)    Chronic, ongoing.  Does not tolerate statins.  Discussed other options such as Zetia and Repatha, patient wishes to consider these options for now.  Would also likely benefit from antiplatelet.  Lipids checked today, patient not fasting.  Referral placed to Vascular for repeat carotid ultrasound.  Smoking cessation encouraged, patient reports not ready to quit.      Relevant Orders   Ambulatory referral to Vascular Surgery   Essential hypertension, benign    Chronic, stable.  BP controlled today in clinic.  Continue amlodipine for now.  CBC, CMP checked today.  DASH diet encouraged.  Follow up in 6 months.        Respiratory   COPD with asthma (HCC)    Chronic, stable on daily Breo and albuterol as needed.  Follows with Pulmonology, continue this collaboration.  No recent exacerbations or hospitalizations.  Smoking cessation encouraged; patient not yet ready to quit despite risk factors.      Relevant Medications   cetirizine (ZYRTEC) 10 MG tablet   Laryngopharyngeal reflux (LPR)    Chronic, stable.  Continue omeprazole 20 mg daily.  Magnesium and CBC checked today.  Return to clinic with breakthrough symptoms.      Relevant Medications   omeprazole (PRILOSEC) 20 MG capsule   Other Relevant Orders   Magnesium   Perennial allergic rhinitis    Chronic, ongoing.  Willing to try alternative to loratadine - will try cetirizine.  Continue rhinocort.  Return to clinic if symptoms remain uncontrolled.      Relevant Medications   cetirizine (ZYRTEC) 10 MG tablet     Other   Hyperlipidemia    Chronic,  ongoing.  Patient previously intolerant to statins.  Discussed alternative options today including Zetia and Repatha.  Would have significant benefit from antilipid agent due to smoking history, family history, and bilateral carotid arterial disease.  Patient would like to consider these alternative medications for now.  Referral placed to vascular for ongoing evaluation/management of bilateral carotid arterial disease and follow up carotid ultrasounds.  With any sudden onset of chest pain or shortness of breath, educated to go to ER.      Relevant Orders   Lipid panel   COMPLETE METABOLIC PANEL WITH GFR   Smoker    Discussed complete cessation, patient is not ready to quit at this time.      Vitamin D deficiency    Chronic, ongoing.  With history of reported osteopenia - would likely benefit from continuous replacement of Vitamin D.  Will check Vitamin D level  today for baseline; encouraged patient to resume OTC Vitamin D in meantime.  Likely would benefit from repeat DEXA scan in future.      Relevant Orders   VITAMIN D 25 Hydroxy (Vit-D Deficiency, Fractures)    Other Visit Diagnoses    Annual physical exam    -  Primary   Relevant Orders   Lipid panel   COMPLETE METABOLIC PANEL WITH GFR   TSH   Encounter for screening mammogram for malignant neoplasm of breast       Relevant Orders   MM DIGITAL SCREENING BILATERAL       Follow up plan: Return in about 2 weeks (around 03/05/2021) for pap smear.   LABORATORY TESTING:  - Pap smear: declined today; encouraged to schedule at earliest convenience  IMMUNIZATIONS:   - Tdap: Tetanus vaccination status reviewed: last tetanus booster within 10 years. - Influenza: Refused - Pneumovax: Refused - Prevnar: Refused - HPV: Not applicable - Zostavax vaccine: Not applicable - COVID-19 vaccine: Declined; Recommend she receive vaccine at allergy clinic to monitor for side effect; she declined this.  SCREENING: -Mammogram: Ordered today  -  Colonoscopy: Up to date  - Bone Density: Up to date  -Hearing Test: Not applicable  -Spirometry: Not applicable   PATIENT COUNSELING:   Advised to take 1 mg of folate supplement per day if capable of pregnancy.   Sexuality: Discussed sexually transmitted diseases, partner selection, use of condoms, avoidance of unintended pregnancy  and contraceptive alternatives.   Advised to avoid cigarette smoking.  I discussed with the patient that most people either abstain from alcohol or drink within safe limits (<=14/week and <=4 drinks/occasion for males, <=7/weeks and <= 3 drinks/occasion for females) and that the risk for alcohol disorders and other health effects rises proportionally with the number of drinks per week and how often a drinker exceeds daily limits.  Discussed cessation/primary prevention of drug use and availability of treatment for abuse.   Diet: Encouraged to adjust caloric intake to maintain  or achieve ideal body weight, to reduce intake of dietary saturated fat and total fat, to limit sodium intake by avoiding high sodium foods and not adding table salt, and to maintain adequate dietary potassium and calcium preferably from fresh fruits, vegetables, and low-fat dairy products.    stressed the importance of regular exercise  Injury prevention: Discussed safety belts, safety helmets, smoke detector, smoking near bedding or upholstery.   Dental health: Discussed importance of regular tooth brushing, flossing, and dental visits.    NEXT PREVENTATIVE PHYSICAL DUE IN 1 YEAR. Return in about 2 weeks (around 03/05/2021) for pap smear.

## 2021-02-19 NOTE — Patient Instructions (Addendum)
F/u for pap smear at earliest convenience, otherwise follow up in 6 months or sooner if needed.  Nice meeting you today.  We will call you with your lab results later this week  You will hear from [place] to schedule an appointment with the vein and vascular center.  We will call you to schedule the mammogram.  Preventive Care 46-58 Years Old, Female Preventive care refers to lifestyle choices and visits with your health care provider that can promote health and wellness. This includes:  A yearly physical exam. This is also called an annual wellness visit.  Regular dental and eye exams.  Immunizations.  Screening for certain conditions.  Healthy lifestyle choices, such as: ? Eating a healthy diet. ? Getting regular exercise. ? Not using drugs or products that contain nicotine and tobacco. ? Limiting alcohol use. What can I expect for my preventive care visit? Physical exam Your health care provider will check your:  Height and weight. These may be used to calculate your BMI (body mass index). BMI is a measurement that tells if you are at a healthy weight.  Heart rate and blood pressure.  Body temperature.  Skin for abnormal spots. Counseling Your health care provider may ask you questions about your:  Past medical problems.  Family's medical history.  Alcohol, tobacco, and drug use.  Emotional well-being.  Home life and relationship well-being.  Sexual activity.  Diet, exercise, and sleep habits.  Work and work Statistician.  Access to firearms.  Method of birth control.  Menstrual cycle.  Pregnancy history. What immunizations do I need? Vaccines are usually given at various ages, according to a schedule. Your health care provider will recommend vaccines for you based on your age, medical history, and lifestyle or other factors, such as travel or where you work.   What tests do I need? Blood tests  Lipid and cholesterol levels. These may be checked every  5 years, or more often if you are over 32 years old.  Hepatitis C test.  Hepatitis B test. Screening  Lung cancer screening. You may have this screening every year starting at age 68 if you have a 30-pack-year history of smoking and currently smoke or have quit within the past 15 years.  Colorectal cancer screening. ? All adults should have this screening starting at age 54 and continuing until age 73. ? Your health care provider may recommend screening at age 42 if you are at increased risk. ? You will have tests every 1-10 years, depending on your results and the type of screening test.  Diabetes screening. ? This is done by checking your blood sugar (glucose) after you have not eaten for a while (fasting). ? You may have this done every 1-3 years.  Mammogram. ? This may be done every 1-2 years. ? Talk with your health care provider about when you should start having regular mammograms. This may depend on whether you have a family history of breast cancer.  BRCA-related cancer screening. This may be done if you have a family history of breast, ovarian, tubal, or peritoneal cancers.  Pelvic exam and Pap test. ? This may be done every 3 years starting at age 85. ? Starting at age 65, this may be done every 5 years if you have a Pap test in combination with an HPV test. Other tests  STD (sexually transmitted disease) testing, if you are at risk.  Bone density scan. This is done to screen for osteoporosis. You may have this scan  if you are at high risk for osteoporosis. Talk with your health care provider about your test results, treatment options, and if necessary, the need for more tests. Follow these instructions at home: Eating and drinking  Eat a diet that includes fresh fruits and vegetables, whole grains, lean protein, and low-fat dairy products.  Take vitamin and mineral supplements as recommended by your health care provider.  Do not drink alcohol if: ? Your health  care provider tells you not to drink. ? You are pregnant, may be pregnant, or are planning to become pregnant.  If you drink alcohol: ? Limit how much you have to 0-1 drink a day. ? Be aware of how much alcohol is in your drink. In the [place] one drink equals one 12 oz bottle of beer (355 mL), one 5 oz glass of wine (148 mL), or one 1 oz glass of hard liquor (44 mL).   Lifestyle  Take daily care of your teeth and gums. Brush your teeth every morning and night with fluoride toothpaste. Floss one time each day.  Stay active. Exercise for at least 30 minutes 5 or more days each week.  Do not use any products that contain nicotine or tobacco, such as cigarettes, e-cigarettes, and chewing tobacco. If you need help quitting, ask your health care provider.  Do not use drugs.  If you are sexually active, practice safe sex. Use a condom or other form of protection to prevent STIs (sexually transmitted infections).  If you do not wish to become pregnant, use a form of birth control. If you plan to become pregnant, see your health care provider for a prepregnancy visit.  If told by your health care provider, take low-dose aspirin daily starting at age 79.  Find healthy ways to cope with stress, such as: ? Meditation, yoga, or listening to music. ? Journaling. ? Talking to a trusted person. ? Spending time with friends and family. Safety  Always wear your seat belt while driving or riding in a vehicle.  Do not drive: ? If you have been drinking alcohol. Do not ride with someone who has been drinking. ? When you are tired or distracted. ? While texting.  Wear a helmet and other protective equipment during sports activities.  If you have firearms in your house, make sure you follow all gun safety procedures. What's next?  Visit your health care provider once a year for an annual wellness visit.  Ask your health care provider how often you should have your eyes and teeth checked.  Stay  up to date on all vaccines. This information is not intended to replace advice given to you by your health care provider. Make sure you discuss any questions you have with your health care provider. Document Revised: 09/18/2020 Document Reviewed: 08/26/2018 Elsevier Patient Education  2021 [employer].

## 2021-02-19 NOTE — Assessment & Plan Note (Signed)
Chronic, ongoing.  Patient previously intolerant to statins.  Discussed alternative options today including Zetia and Repatha.  Would have significant benefit from antilipid agent due to smoking history, family history, and bilateral carotid arterial disease.  Patient would like to consider these alternative medications for now.  Referral placed to vascular for ongoing evaluation/management of bilateral carotid arterial disease and follow up carotid ultrasounds.  With any sudden onset of chest pain or shortness of breath, educated to go to ER.

## 2021-02-19 NOTE — Assessment & Plan Note (Addendum)
Chronic, stable on daily Breo and albuterol as needed.  Follows with Pulmonology, continue this collaboration.  No recent exacerbations or hospitalizations.  Smoking cessation encouraged; patient not yet ready to quit despite risk factors.

## 2021-02-20 ENCOUNTER — Other Ambulatory Visit: Payer: Self-pay

## 2021-02-20 DIAGNOSIS — I10 Essential (primary) hypertension: Secondary | ICD-10-CM

## 2021-02-20 DIAGNOSIS — E7800 Pure hypercholesterolemia, unspecified: Secondary | ICD-10-CM

## 2021-02-20 DIAGNOSIS — E559 Vitamin D deficiency, unspecified: Secondary | ICD-10-CM

## 2021-02-20 DIAGNOSIS — M8580 Other specified disorders of bone density and structure, unspecified site: Secondary | ICD-10-CM

## 2021-02-20 LAB — MAGNESIUM: Magnesium: 2.1 mg/dL (ref 1.5–2.5)

## 2021-02-20 LAB — COMPLETE METABOLIC PANEL WITH GFR
AG Ratio: 2 (calc) (ref 1.0–2.5)
ALT: 16 U/L (ref 6–29)
AST: 17 U/L (ref 10–35)
Albumin: 4.2 g/dL (ref 3.6–5.1)
Alkaline phosphatase (APISO): 115 U/L (ref 37–153)
CO2: 27 mmol/L (ref 20–32)
Calcium: 9.8 mg/dL (ref 8.6–10.4)
Chloride: 105 mmol/L (ref 98–110)
Creat: 0.76 mg/dL (ref 0.50–1.05)
GFR, Est African American: 101 mL/min/{1.73_m2} (ref >=60)
GFR, Est Non African American: 87 mL/min/{1.73_m2} (ref >=60)
Globulin: 2.1 g/dL (calc) (ref 1.9–3.7)
Glucose, Bld: 83 mg/dL (ref 65–99)
Potassium: 4.7 mmol/L (ref 3.5–5.3)
Sodium: 139 mmol/L (ref 135–146)
Total Bilirubin: 0.4 mg/dL (ref 0.2–1.2)
Total Protein: 6.3 g/dL (ref 6.1–8.1)

## 2021-02-20 LAB — LIPID PANEL
Cholesterol: 207 mg/dL — ABNORMAL HIGH (ref <200)
HDL: 70 mg/dL (ref >=50)
LDL Cholesterol (Calc): 118 mg/dL (calc) — ABNORMAL HIGH
Non-HDL Cholesterol (Calc): 137 mg/dL (calc) — ABNORMAL HIGH (ref <130)
Total CHOL/HDL Ratio: 3 (calc) (ref <5.0)
Triglycerides: 91 mg/dL (ref <150)

## 2021-02-20 LAB — TSH: TSH: 0.48 mIU/L (ref 0.40–4.50)

## 2021-02-20 LAB — VITAMIN D 25 HYDROXY (VIT D DEFICIENCY, FRACTURES): Vit D, 25-Hydroxy: 22 ng/mL — ABNORMAL LOW (ref 30–100)

## 2021-02-20 MED ORDER — EZETIMIBE 10 MG PO TABS: 10.0000 mg | ORAL_TABLET | Freq: Every day | ORAL | 3 refills | Status: DC

## 2021-02-20 NOTE — Progress Notes (Signed)
Spoke with pt, she has agreed to both taking the zetia medication and dexa. Both have been order for pt, 3 month f/u to rp labs. Labs have been ordered for future

## 2021-02-20 NOTE — Addendum Note (Signed)
Addended by: Shon Hale on: 02/20/2021 09:14 AM   Modules accepted: Orders

## 2021-03-05 ENCOUNTER — Ambulatory Visit (INDEPENDENT_AMBULATORY_CARE_PROVIDER_SITE_OTHER): Payer: BC Managed Care – PPO | Admitting: Nurse Practitioner

## 2021-03-05 ENCOUNTER — Encounter: Payer: Self-pay | Admitting: Nurse Practitioner

## 2021-03-05 ENCOUNTER — Other Ambulatory Visit: Payer: Self-pay

## 2021-03-05 VITALS — BP 118/78 | HR 84 | Temp 98.4°F | Ht 60.0 in | Wt 117.2 lb

## 2021-03-05 DIAGNOSIS — Z124 Encounter for screening for malignant neoplasm of cervix: Secondary | ICD-10-CM

## 2021-03-05 DIAGNOSIS — Z01419 Encounter for gynecological examination (general) (routine) without abnormal findings: Secondary | ICD-10-CM | POA: Diagnosis not present

## 2021-03-05 NOTE — Progress Notes (Signed)
Subjective:    Patient ID: Toni Mcdonald, female    DOB: 1961/03/25, 60 y.o.   MRN: [id number]  HPI: Toni Mcdonald is a 61 y.o. female presenting for pap smear.  Chief Complaint  Patient presents with  . Gynecologic Exam   Patient presents for gynecologic exam - due for pap smear.  No vaginal bleeding, discharge, concerns or complaints today.  Allergies  Allergen Reactions  . Other Diarrhea and Rash  . Penicillins Anaphylaxis    "whelps"  . Erythromycin   . Azithromycin Diarrhea    GI Upset   . Codeine Itching and Swelling    "skin crawls'  . Neosporin [Neomycin-Bacitracin Zn-Polymyx]     "blisters the skin"  . Percocet [Oxycodone-Acetaminophen]     Nausea and vomiting  . Statins Other (See Comments)    Mental status changes, myalgias  . Sulfamethoxazole-Trimethoprim   . Wellbutrin [Bupropion] Hives    Outpatient Encounter Medications as of 03/05/2021  Medication Sig  . albuterol (PROVENTIL HFA;VENTOLIN HFA) 108 (90 Base) MCG/ACT inhaler Inhale 1-2 puffs into the lungs every 6 (six) hours as needed for wheezing or shortness of breath.  Marland Kitchen amLODipine (NORVASC) 5 MG tablet TAKE 1 TABLET BY MOUTH EVERY DAY  . BREO ELLIPTA 100-25 MCG/INH AEPB TAKE 1 PUFF BY MOUTH EVERY DAY  . budesonide (RHINOCORT AQUA) 32 MCG/ACT nasal spray EACH NARE  . cetirizine (ZYRTEC) 10 MG tablet Take 1 tablet (10 mg total) by mouth daily.  . Cholecalciferol (DIALYVITE VITAMIN D 5000) 125 MCG (5000 UT) capsule Take 1 capsule (5,000 Units total) by mouth daily.  Marland Kitchen ezetimibe (ZETIA) 10 MG tablet Take 1 tablet (10 mg total) by mouth daily.  Marland Kitchen omeprazole (PRILOSEC) 20 MG capsule Take 1 capsule (20 mg total) by mouth daily.  Marland Kitchen Propylene Glycol 0.6 % SOLN Place 2 drops into both eyes daily.   No facility-administered encounter medications on file as of 03/05/2021.    Patient Active Problem List   Diagnosis Date Noted  . Carotid arterial disease (HCC) 07/25/2019  . Cough 01/31/2019  . COPD  with asthma (HCC) 01/31/2019  . Perennial allergic rhinitis 01/31/2019  . Laryngopharyngeal reflux (LPR) 01/31/2019  . Tobacco dependence due to cigarettes 01/31/2019  . Hyperlipidemia 07/22/2018  . 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 Chest pain 04/11/2016  . Essential hypertension, benign 07/27/2014  . Anxiety 07/27/2014  . Vitamin D deficiency 07/27/2014  . Smoker 07/27/2014    Past Medical History:  Diagnosis Date  . Anxiety   . Arthritis   . Asthma    Phreesia 02/18/2021  . Bronchitis    hx of  . Chronic neck pain   . GERD (gastroesophageal reflux disease)   . Headache(784.0)    migraines  . Hypertension    controlled by diet and exercise  . Pneumonia    hx of    Relevant past medical, surgical, family and social history reviewed and updated as indicated. Interim medical history since our last visit reviewed.  Review of Systems Per HPI unless specifically indicated above     Objective:    BP 118/78   Pulse 84   Temp 98.4 F (36.9 C)   Ht 5' (1.524 m)   Wt 117 lb 3.2 oz (53.2 kg)   SpO2 94%   BMI 22.89 kg/m   Wt Readings from Last 3 Encounters:  03/05/21 117 lb 3.2 oz (53.2 kg)  02/19/21 115 lb 3.2 oz (52.3 kg)  05/17/20 125 lb 6.4 oz (56.9 kg)  Physical Exam Vitals reviewed. Exam conducted with a chaperone present Moises Blood).  Constitutional:      General: She is not in acute distress.    Appearance: She is not toxic-appearing.  Genitourinary:    General: Normal vulva.     Exam position: Lithotomy position.     Labia:        Right: No rash, tenderness or lesion.        Left: No rash, tenderness or lesion.      Vagina: Normal. No erythema, tenderness or lesions.     Cervix: No cervical motion tenderness, discharge, friability, lesion, erythema or cervical bleeding.     Uterus: Normal.      Adnexa: Right adnexa normal and left adnexa normal.  Skin:    General: Skin is warm and dry.     Coloration: Skin is not jaundiced or pale.     Findings: No  erythema.  Neurological:     Mental Status: She is alert and oriented to person, place, and time.  Psychiatric:        Mood and Affect: Mood normal.        Behavior: Behavior normal.        Thought Content: Thought content normal.        Judgment: Judgment normal.     Results for orders placed or performed in visit on 02/19/21  Lipid panel  Result Value Ref Range   Cholesterol 207 (H) <200 mg/dL   HDL 70 > OR = 50 mg/dL   Triglycerides 91 <450 mg/dL   LDL Cholesterol (Calc) 118 (H) mg/dL (calc)   Total CHOL/HDL Ratio 3.0 <5.0 (calc)   Non-HDL Cholesterol (Calc) 137 (H) <130 mg/dL (calc)  COMPLETE METABOLIC PANEL WITH GFR  Result Value Ref Range   Glucose, Bld 83 65 - 99 mg/dL   BUN 8 7 - 25 mg/dL   Creat 3.88 8.28 - 0.03 mg/dL   GFR, Est Non African American 87 > OR = 60 mL/min/1.70m2   GFR, Est African American 101 > OR = 60 mL/min/1.89m2   BUN/Creatinine Ratio NOT APPLICABLE 6 - 22 (calc)   Sodium 139 135 - 146 mmol/L   Potassium 4.7 3.5 - 5.3 mmol/L   Chloride 105 98 - 110 mmol/L   CO2 27 20 - 32 mmol/L   Calcium 9.8 8.6 - 10.4 mg/dL   Total Protein 6.3 6.1 - 8.1 g/dL   Albumin 4.2 3.6 - 5.1 g/dL   Globulin 2.1 1.9 - 3.7 g/dL (calc)   AG Ratio 2.0 1.0 - 2.5 (calc)   Total Bilirubin 0.4 0.2 - 1.2 mg/dL   Alkaline phosphatase (APISO) 115 37 - 153 U/L   AST 17 10 - 35 U/L   ALT 16 6 - 29 U/L  TSH  Result Value Ref Range   TSH 0.48 0.40 - 4.50 mIU/L  VITAMIN D 25 Hydroxy (Vit-D Deficiency, Fractures)  Result Value Ref Range   Vit D, 25-Hydroxy 22 (L) 30 - 100 ng/mL  Magnesium  Result Value Ref Range   Magnesium 2.1 1.5 - 2.5 mg/dL      Assessment & Plan:   Problem List Items Addressed This Visit   None   Visit Diagnoses    Encounter for gynecological examination    -  Primary   Cervical cancer screening       Relevant Orders   PAP,TP IMGw/HPV RNA,rflx HPVTYPE16,18/45       Follow up plan: Return in about 6 months (around 09/05/2021).

## 2021-03-05 NOTE — Patient Instructions (Addendum)
F/u 6 months   Pap Test Why am I having this test? A Pap test, also called a Pap smear, is a screening test to check for signs of:  Cancer of the vagina, cervix, and uterus. The cervix is the lower part of the uterus that opens into the vagina.  Infection.  Changes that may be a sign that cancer is developing (precancerous changes). Women need this test on a regular basis. In general, you should have a Pap test every 3 years until you reach menopause or age 28. Women aged 30-60 may choose to have their Pap test done at the same time as an HPV (human papillomavirus) test every 5 years (instead of every 3 years). Your health care provider may recommend having Pap tests more or less often depending on your medical conditions and past Pap test results. What kind of sample is taken? Your health care provider will collect a sample of cells from the surface of your cervix. This will be done using a small cotton swab, plastic spatula, or brush. This sample is often collected during a pelvic exam, when you are lying on your back on an exam table with feet in footrests (stirrups). In some cases, fluids (secretions) from the cervix or vagina may also be collected.   How do I prepare for this test?  Be aware of where you are in your menstrual cycle. If you are menstruating on the day of the test, you may be asked to reschedule.  You may need to reschedule if you have a known vaginal infection on the day of the test.  Follow instructions from your health care provider about: ? Changing or stopping your regular medicines. Some medicines can cause abnormal test results, such as digitalis and tetracycline. ? Avoiding douching or taking a bath the day before or the day of the test. Tell a health care provider about:  Any allergies you have.  All medicines you are taking, including vitamins, herbs, eye drops, creams, and over-the-counter medicines.  Any blood disorders you have.  Any surgeries you  have had.  Any medical conditions you have.  Whether you are pregnant or may be pregnant. How are the results reported? Your test results will be reported as either abnormal or normal. A false-positive result can occur. A false positive is incorrect because it means that a condition is present when it is not. A false-negative result can occur. A false negative is incorrect because it means that a condition is not present when it is. What do the results mean? A normal test result means that you do not have signs of cancer of the vagina, cervix, or uterus. An abnormal result may mean that you have:  Cancer. A Pap test by itself is not enough to diagnose cancer. You will have more tests done in this case.  Precancerous changes in your vagina, cervix, or uterus.  Inflammation of the cervix.  An STD (sexually transmitted disease).  A fungal infection.  A parasite infection. Talk with your health care provider about what your results mean. Questions to ask your health care provider Ask your health care provider, or the department that is doing the test:  When will my results be ready?  How will I get my results?  What are my treatment options?  What other tests do I need?  What are my next steps? Summary  In general, women should have a Pap test every 3 years until they reach menopause or age 53.  Your  health care provider will collect a sample of cells from the surface of your cervix. This will be done using a small cotton swab, plastic spatula, or brush.  In some cases, fluids (secretions) from the cervix or vagina may also be collected. This information is not intended to replace advice given to you by your health care provider. Make sure you discuss any questions you have with your health care provider. Document Revised: 08/22/2020 Document Reviewed: 08/17/2020 Elsevier Patient Education  2021 [employer].

## 2021-03-08 LAB — PAP, TP IMAGING W/ HPV RNA, RFLX HPV TYPE 16,18/45: HPV DNA High Risk: NOT DETECTED

## 2021-03-11 ENCOUNTER — Encounter: Payer: Self-pay | Admitting: *Deleted

## 2021-03-14 ENCOUNTER — Other Ambulatory Visit (INDEPENDENT_AMBULATORY_CARE_PROVIDER_SITE_OTHER): Payer: Self-pay | Admitting: Vascular Surgery

## 2021-03-14 DIAGNOSIS — I6523 Occlusion and stenosis of bilateral carotid arteries: Secondary | ICD-10-CM

## 2021-03-18 ENCOUNTER — Ambulatory Visit (INDEPENDENT_AMBULATORY_CARE_PROVIDER_SITE_OTHER): Payer: BC Managed Care – PPO

## 2021-03-18 ENCOUNTER — Other Ambulatory Visit: Payer: Self-pay

## 2021-03-18 ENCOUNTER — Encounter (INDEPENDENT_AMBULATORY_CARE_PROVIDER_SITE_OTHER): Payer: Self-pay | Admitting: Vascular Surgery

## 2021-03-18 ENCOUNTER — Ambulatory Visit (INDEPENDENT_AMBULATORY_CARE_PROVIDER_SITE_OTHER): Payer: BC Managed Care – PPO | Admitting: Vascular Surgery

## 2021-03-18 VITALS — BP 133/84 | HR 84 | Ht 60.0 in | Wt 117.0 lb

## 2021-03-18 DIAGNOSIS — E7800 Pure hypercholesterolemia, unspecified: Secondary | ICD-10-CM | POA: Diagnosis not present

## 2021-03-18 DIAGNOSIS — I6523 Occlusion and stenosis of bilateral carotid arteries: Secondary | ICD-10-CM | POA: Diagnosis not present

## 2021-03-18 DIAGNOSIS — J449 Chronic obstructive pulmonary disease, unspecified: Secondary | ICD-10-CM

## 2021-03-18 DIAGNOSIS — I10 Essential (primary) hypertension: Secondary | ICD-10-CM

## 2021-03-18 NOTE — Progress Notes (Signed)
MRN : [id number]  Toni Mcdonald is a 60 y.o. (01-14-1961) female who presents with chief complaint of  Chief Complaint  Patient presents with  . New Patient (Initial Visit)    Bradly [place]ienen[place]. Bil. Occlusion. carotid  . Carotid  .  History of Present Illness:   The patient is seen for evaluation of carotid stenosis. The carotid stenosis was identified remotely but she has not had recent follow up.  The patient denies amaurosis fugax. There is no recent history of TIA symptoms or focal motor deficits. There is no prior documented CVA.  There is no history of migraine headaches. There is no history of seizures.  The patient is taking enteric-coated aspirin 81 mg daily.  The patient has a history of coronary artery disease, no recent episodes of angina or shortness of breath. The patient denies PAD or claudication symptoms. There is a history of hyperlipidemia which is being treated with a statin.   Duplex ultrasound of the carotid arteries obtained today demonstrates RICA=40-59% and LICA =60-79%  Current Meds  Medication Sig  . albuterol (PROVENTIL HFA;VENTOLIN HFA) 108 (90 Base) MCG/ACT inhaler Inhale 1-2 puffs into the lungs every 6 (six) hours as needed for wheezing or shortness of breath.  Marland Kitchen amLODipine (NORVASC) 5 MG tablet TAKE 1 TABLET BY MOUTH EVERY DAY  . BREO ELLIPTA 100-25 MCG/INH AEPB TAKE 1 PUFF BY MOUTH EVERY DAY  . budesonide (RHINOCORT AQUA) 32 MCG/ACT nasal spray EACH NARE  . cetirizine (ZYRTEC) 10 MG tablet Take 1 tablet (10 mg total) by mouth daily.  . Cholecalciferol (DIALYVITE VITAMIN D 5000) 125 MCG (5000 UT) capsule Take 1 capsule (5,000 Units total) by mouth daily.  Marland Kitchen ezetimibe (ZETIA) 10 MG tablet Take 1 tablet (10 mg total) by mouth daily.  Marland Kitchen omeprazole (PRILOSEC) 20 MG capsule Take 1 capsule (20 mg total) by mouth daily.  Marland Kitchen Propylene Glycol 0.6 % SOLN Place 2 drops into both eyes daily.    Past Medical History:  Diagnosis Date  . Anxiety   .  Arthritis   . Asthma    Phreesia 02/18/2021  . Bronchitis    hx of  . Chronic neck pain   . GERD (gastroesophageal reflux disease)   . Headache(784.0)    migraines  . Hypertension    controlled by diet and exercise  . Pneumonia    hx of    Past Surgical History:  Procedure Laterality Date  . ABDOMINAL HYSTERECTOMY     Has both ovaries  . ANTERIOR CERVICAL DECOMP/DISCECTOMY FUSION  09/22/2012   Procedure: ANTERIOR CERVICAL DECOMPRESSION/DISCECTOMY FUSION 3 LEVELS;  Surgeon: Tia Alert, MD;  Location: [facility];  Service: Neurosurgery;  Laterality: N/A;  Anterior Cervical Diskectomy/Fusion with Plate, Cervical four through seven  . CARDIOVASCULAR STRESS TEST     2008  . COLONOSCOPY WITH PROPOFOL N/A 07/10/2016   Procedure: COLONOSCOPY WITH PROPOFOL;  Surgeon: Midge Minium, MD;  Location: [facility];  Service: Endoscopy;  Laterality: N/A;  . DIAGNOSTIC LAPAROSCOPY     exploratory surgery due to "scarring"  . TUBAL LIGATION      Social History Social History   Tobacco Use  . Smoking status: Current Every Day Smoker    Packs/day: 1.00    Years: 35.00    Pack years: 35.00    Types: Cigarettes  . Smokeless tobacco: Never Used  . Tobacco comment: pack per day 02.24.20  Substance Use Topics  . Alcohol use: Yes    Alcohol/week: 4.0 standard  drinks    Types: 4 Glasses of wine per week    Comment: occasional  . Drug use: No    Family History Family History  Problem Relation Age of Onset  . Arthritis Mother   . Heart disease Mother   . Hyperlipidemia Mother   . Hypertension Mother   . Stroke Mother   . Heart disease Father   . Hyperlipidemia Father   . Hypertension Father   . Kidney disease Father   . Stroke Father   . Thyroid disease Sister   . Thyroid disease Sister   . Thyroid disease Sister   . Vision loss Maternal Grandfather   . Early death Brother   . Heart disease Brother 71       Died at 31 with MI  . Heart attack Brother   No family  history of bleeding/clotting disorders, porphyria or autoimmune disease   Allergies  Allergen Reactions  . Other Diarrhea and Rash  . Penicillins Anaphylaxis    "whelps"  . Erythromycin   . Azithromycin Diarrhea    GI Upset   . Codeine Itching and Swelling    "skin crawls'  . Neosporin [Neomycin-Bacitracin Zn-Polymyx]     "blisters the skin"  . Percocet [Oxycodone-Acetaminophen]     Nausea and vomiting  . Statins Other (See Comments)    Mental status changes, myalgias  . Sulfamethoxazole-Trimethoprim   . Wellbutrin [Bupropion] Hives     REVIEW OF SYSTEMS (Negative unless checked)  Constitutional: [] Weight loss  [] Fever  [] Chills Cardiac: [] Chest pain   [] Chest pressure   [] Palpitations   [] Shortness of breath when laying flat   [] Shortness of breath with exertion. Vascular:  [] Pain in legs with walking   [] Pain in legs at rest  [] History of DVT   [] Phlebitis   [] Swelling in legs   [] Varicose veins   [] Non-healing ulcers Pulmonary:   [] Uses home oxygen   [] Productive cough   [] Hemoptysis   [] Wheeze  [] COPD   [] Asthma Neurologic:  [] Dizziness   [] Seizures   [] History of stroke   [] History of TIA  [] Aphasia   [] Vissual changes   [] Weakness or numbness in arm   [] Weakness or numbness in leg Musculoskeletal:   [] Joint swelling   [] Joint pain   [] Low back pain Hematologic:  [] Easy bruising  [] Easy bleeding   [] Hypercoagulable state   [] Anemic Gastrointestinal:  [] Diarrhea   [] Vomiting  [] Gastroesophageal reflux/heartburn   [] Difficulty swallowing. Genitourinary:  [] Chronic kidney disease   [] Difficult urination  [] Frequent urination   [] Blood in urine Skin:  [] Rashes   [] Ulcers  Psychological:  [] History of anxiety   []  History of major depression.  Physical Examination  Vitals:   03/18/21 0849  BP: 133/84  Pulse: 84  Weight: 117 lb (53.1 kg)  Height: 5' (1.524 m)   Body mass index is 22.85 kg/m. Gen: WD/WN, NAD Head: [place]/AT, No temporalis wasting.  Ear/Nose/Throat:  Hearing grossly intact, nares w/o erythema or drainage, poor dentition Eyes: PER, EOMI, sclera nonicteric.  Neck: Supple, no masses.  No bruit or JVD.  Pulmonary:  Good air movement, clear to auscultation bilaterally, no use of accessory muscles.  Cardiac: RRR, normal S1, S2, no Murmurs. Vascular: Bilateral carotid bruits Vessel Right Left  Radial Palpable Palpable  Brachial Palpable Palpable  Carotid Palpable Palpable  Gastrointestinal: soft, non-distended. No guarding/no peritoneal signs.  Musculoskeletal: M/S 5/5 throughout.  No deformity or atrophy.  Neurologic: CN 2-12 intact. Pain and light touch intact in extremities.  Symmetrical.  Speech  is fluent. Motor exam as listed above. Psychiatric: Judgment intact, Mood & affect appropriate for pt's clinical situation. Dermatologic: No rashes or ulcers noted.  No changes consistent with cellulitis.   CBC Lab Results  Component Value Date   WBC 11.4 (H) 07/25/2019   HGB 15.0 07/25/2019   HCT 45.2 (H) 07/25/2019   MCV 89.2 07/25/2019   PLT 495 (H) 07/25/2019    BMET    Component Value Date/Time   NA 139 02/19/2021 0835   K 4.7 02/19/2021 0835   CL 105 02/19/2021 0835   CO2 27 02/19/2021 0835   GLUCOSE 83 02/19/2021 0835   BUN 8 02/19/2021 0835   CREATININE 0.76 02/19/2021 0835   CALCIUM 9.8 02/19/2021 0835   GFRNONAA 87 02/19/2021 0835   GFRAA 101 02/19/2021 0835   CrCl cannot be calculated (Patient's most recent lab result is older than the maximum 21 days allowed.).  COAG Lab Results  Component Value Date   INR 1.0 06/17/2016   INR 0.92 09/15/2012    Radiology No results found.   Assessment/Plan 1. Bilateral carotid artery occlusion Recommend:  Given the patient's asymptomatic subcritical stenosis no further invasive testing or surgery at this time.  Duplex ultrasound of the carotid arteries obtained today demonstrates RICA=40-59% and LICA =60-79%  Continue antiplatelet therapy as prescribed Continue  management of CAD, HTN and Hyperlipidemia Healthy heart diet,  encouraged exercise at least 4 times per week Follow up in 6 months with duplex ultrasound and physical exam  - VAS US CAROTID; Future  2. Essential hypertension, benign Continue antihypertensive medications as already ordered, these medications have been reviewed and there are no changes at this time.  3. COPD with asthma (HCC) Continue pulmonary medications and aerosols as already ordered, these medications have been reviewed and there are no changes at this time.  4. Pure hypercholesterolemia Continue statin as ordered and reviewed, no changes at this time     Levora Dredge, MD  03/18/2021 8:51 AM

## 2021-03-19 ENCOUNTER — Encounter (INDEPENDENT_AMBULATORY_CARE_PROVIDER_SITE_OTHER): Payer: Self-pay | Admitting: Vascular Surgery

## 2021-07-31 ENCOUNTER — Encounter: Payer: Self-pay | Admitting: Nurse Practitioner

## 2021-07-31 ENCOUNTER — Telehealth (INDEPENDENT_AMBULATORY_CARE_PROVIDER_SITE_OTHER): Payer: BC Managed Care – PPO | Admitting: Nurse Practitioner

## 2021-07-31 ENCOUNTER — Other Ambulatory Visit: Payer: Self-pay

## 2021-07-31 DIAGNOSIS — J441 Chronic obstructive pulmonary disease with (acute) exacerbation: Secondary | ICD-10-CM | POA: Diagnosis not present

## 2021-07-31 DIAGNOSIS — J449 Chronic obstructive pulmonary disease, unspecified: Secondary | ICD-10-CM | POA: Diagnosis not present

## 2021-07-31 DIAGNOSIS — I10 Essential (primary) hypertension: Secondary | ICD-10-CM | POA: Diagnosis not present

## 2021-07-31 MED ORDER — AMLODIPINE BESYLATE 5 MG PO TABS: 5.0000 mg | ORAL_TABLET | Freq: Every day | ORAL | 1 refills | Status: DC

## 2021-07-31 MED ORDER — PREDNISONE 20 MG PO TABS: 40.0000 mg | ORAL_TABLET | Freq: Every day | ORAL | 0 refills | Status: AC

## 2021-07-31 MED ORDER — ALBUTEROL SULFATE HFA 108 (90 BASE) MCG/ACT IN AERS: 1.0000 | INHALATION_SPRAY | Freq: Four times a day (QID) | RESPIRATORY_TRACT | 3 refills | Status: DC | PRN

## 2021-07-31 MED ORDER — DOXYCYCLINE HYCLATE 100 MG PO TABS: 100.0000 mg | ORAL_TABLET | Freq: Two times a day (BID) | ORAL | 0 refills | Status: AC

## 2021-07-31 NOTE — Assessment & Plan Note (Signed)
Requesting refill today - will refill so she does not run out.  Will have her schedule OV for next 2 months to recheck BP in person.

## 2021-07-31 NOTE — Progress Notes (Signed)
Subjective:    Patient ID: Toni Mcdonald, female    DOB: May 18, 1961, 60 y.o.   MRN: [id number]  HPI: Toni Mcdonald is a 60 y.o. female presenting virtually for sinus infection.  Chief Complaint  Patient presents with   Sinus Problem    Per pt, yrly occurrence with sinusitis. Having drainage, cough  and discomfort in ears, pt did not get covid vaccinated. Does have some aches in the body, however this could be from cholesterol med. Took home test on Monday which was neg   UPPER RESPIRATORY TRACT INFECTION Onset: Saturday/Sunday COVID-19 testing history: tested negative Monday COVID-19 vaccination status: no COVID vaccine Fever: no Cough: yes Shortness of breath: no Wheezing: no Chest pain: yes, with cough Chest tightness: no Chest congestion: yes Nasal congestion: yes Runny nose: yes Post nasal drip: yes Sneezing: yes Sore throat: yes Swollen glands: yes Sinus pressure: yes Headache: yes Face pain: no Toothache: no Ear pain: yes ; right ear worse Ear pressure: no  Eyes red/itching:no Eye drainage/crusting: no  Nausea: no  Vomiting: no Diarrhea: no  Change in appetite: no  Treatments attempted: nasal saline mist Relief with OTC medications: somewhat  Allergies  Allergen Reactions   Other Diarrhea and Rash   Penicillins Anaphylaxis    "whelps"   Erythromycin    Azithromycin Diarrhea    GI Upset    Codeine Itching and Swelling    "skin crawls'   Neosporin [Neomycin-Bacitracin Zn-Polymyx]     "blisters the skin"   Percocet [Oxycodone-Acetaminophen]     Nausea and vomiting   Statins Other (See Comments)    Mental status changes, myalgias   Sulfamethoxazole-Trimethoprim    Wellbutrin [Bupropion] Hives    Outpatient Encounter Medications as of 07/31/2021  Medication Sig   BREO ELLIPTA 100-25 MCG/INH AEPB TAKE 1 PUFF BY MOUTH EVERY DAY   budesonide (RHINOCORT AQUA) 32 MCG/ACT nasal spray EACH NARE   cetirizine (ZYRTEC) 10 MG tablet Take 1  tablet (10 mg total) by mouth daily.   Cholecalciferol (DIALYVITE VITAMIN D 5000) 125 MCG (5000 UT) capsule Take 1 capsule (5,000 Units total) by mouth daily.   doxycycline (VIBRA-TABS) 100 MG tablet Take 1 tablet (100 mg total) by mouth 2 (two) times daily for 5 days.   ezetimibe (ZETIA) 10 MG tablet Take 1 tablet (10 mg total) by mouth daily.   omeprazole (PRILOSEC) 20 MG capsule Take 1 capsule (20 mg total) by mouth daily.   predniSONE (DELTASONE) 20 MG tablet Take 2 tablets (40 mg total) by mouth daily with breakfast for 5 days.   Propylene Glycol 0.6 % SOLN Place 2 drops into both eyes daily.   [DISCONTINUED] albuterol (PROVENTIL HFA;VENTOLIN HFA) 108 (90 Base) MCG/ACT inhaler Inhale 1-2 puffs into the lungs every 6 (six) hours as needed for wheezing or shortness of breath.   [DISCONTINUED] amLODipine (NORVASC) 5 MG tablet TAKE 1 TABLET BY MOUTH EVERY DAY   albuterol (VENTOLIN HFA) 108 (90 Base) MCG/ACT inhaler Inhale 1-2 puffs into the lungs every 6 (six) hours as needed for wheezing or shortness of breath.   amLODipine (NORVASC) 5 MG tablet Take 1 tablet (5 mg total) by mouth daily.   No facility-administered encounter medications on file as of 07/31/2021.    Patient Active Problem List   Diagnosis Date Noted   Carotid arterial disease (HCC) 07/25/2019   Cough 01/31/2019   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   Perennial allergic rhinitis 01/31/2019   Laryngopharyngeal reflux (LPR) 01/31/2019  Tobacco dependence due to cigarettes 01/31/2019   Hyperlipidemia 07/22/2018   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   Chest pain 04/11/2016   Essential hypertension, benign 07/27/2014   Anxiety 07/27/2014   Vitamin D deficiency 07/27/2014   Smoker 07/27/2014    Past Medical History:  Diagnosis Date   Anxiety    Arthritis    Asthma    Phreesia 02/18/2021   Bronchitis    hx of   Chronic neck pain    GERD (gastroesophageal reflux disease)    Headache(784.0)    migraines   Hypertension     controlled by diet and exercise   Pneumonia    hx of    Relevant past medical, surgical, family and social history reviewed and updated as indicated. Interim medical history since our last visit reviewed.  Review of Systems Per HPI unless specifically indicated above     Objective:    There were no vitals taken for this visit.  Wt Readings from Last 3 Encounters:  03/18/21 117 lb (53.1 kg)  03/05/21 117 lb 3.2 oz (53.2 kg)  02/19/21 115 lb 3.2 oz (52.3 kg)    Physical Exam Nursing note reviewed.  Constitutional:      General: She is not in acute distress.    Appearance: Normal appearance. She is not ill-appearing, toxic-appearing or diaphoretic.  HENT:     Head: Normocephalic and atraumatic.     Nose: Congestion present. No rhinorrhea.  Eyes:     General: No scleral icterus.       Right eye: No discharge.        Left eye: No discharge.     Extraocular Movements: Extraocular movements intact.  Pulmonary:     Effort: Pulmonary effort is normal. No respiratory distress.     Comments: Unable to assess breath sounds via virtual visit; patient talking in complete sentences during telemedicine visit without accessory muscle use. Skin:    Coloration: Skin is not jaundiced or pale.     Findings: No erythema.  Neurological:     Mental Status: She is alert and oriented to person, place, and time.  Psychiatric:        Mood and Affect: Mood normal.        Behavior: Behavior normal.        Thought Content: Thought content normal.        Judgment: Judgment normal.      Assessment & Plan:   Problem List Items Addressed This Visit       Cardiovascular and Mediastinum   Essential hypertension, benign    Requesting refill today - will refill so she does not run out.  Will have her schedule OV for next 2 months to recheck BP in person.       Relevant Medications   amLODipine (NORVASC) 5 MG tablet     Respiratory   COPD with asthma (HCC)    Chronic.  Concern for exacerbation  with purulent mucus production x 5 days.  Will treat with prednisone burst and doxycycline.  Encouraged viral panel testing.  Follow up if symptoms do not improve within 2 days of starting treatment.  Refill of albuterol give to use for rescue purposes.        Relevant Medications   albuterol (VENTOLIN HFA) 108 (90 Base) MCG/ACT inhaler   predniSONE (DELTASONE) 20 MG tablet   Other Visit Diagnoses     COPD exacerbation (HCC)    -  Primary   Relevant Medications   albuterol (VENTOLIN HFA) 108 (  90 Base) MCG/ACT inhaler   doxycycline (VIBRA-TABS) 100 MG tablet   predniSONE (DELTASONE) 20 MG tablet        Follow up plan: Return in about 2 months (around 09/30/2021) for chronic f/u.  Due to the catastrophic nature of the COVID-19 pandemic, this video visit was completed soley via audio and visual contact via Caregility due to the restrictions of the COVID-19 pandemic.  All issues as above were discussed and addressed. Physical exam was done as above through visual confirmation on Caregility. If it was felt that the patient should be evaluated in the office, they were directed there. The patient verbally consented to this visit. Location of the patient: home Location of the provider: work Those involved with this call:  Provider: Cathlean Marseilles, DNP, FNP-C CMA: Moises Blood, CMA Front Desk/Registration: Claudine Mouton  Time spent on call:  12 minutes with patient face to face via video conference. More than 50% of this time was spent in counseling and coordination of care. 15 minutes total spent in review of patient's record and preparation of their chart. I verified patient identity using two factors (patient name and date of birth). Patient consents verbally to being seen via telemedicine visit today.

## 2021-07-31 NOTE — Assessment & Plan Note (Signed)
Chronic.  Concern for exacerbation with purulent mucus production x 5 days.  Will treat with prednisone burst and doxycycline.  Encouraged viral panel testing.  Follow up if symptoms do not improve within 2 days of starting treatment.  Refill of albuterol give to use for rescue purposes.

## 2021-08-14 ENCOUNTER — Other Ambulatory Visit: Payer: Self-pay | Admitting: Nurse Practitioner

## 2021-08-14 DIAGNOSIS — K219 Gastro-esophageal reflux disease without esophagitis: Secondary | ICD-10-CM

## 2021-09-04 NOTE — Progress Notes (Deleted)
Subjective:    Patient ID: Toni Mcdonald, female    DOB: 1961/02/08, 60 y.o.   MRN: [id number]  HPI: Toni Mcdonald is a 60 y.o. female presenting for  No chief complaint on file.  HYPERTENSION / HYPERLIPIDEMIA Currently taking amlodipine 5  mg daily, Zetia 10 mg daily. Satisfied with current treatment? {Blank single:19197::"yes","no"} Duration of hypertension: {Blank single:19197::"chronic","months","years"} BP monitoring frequency: {Blank single:19197::"not checking","rarely","daily","weekly","monthly","a few times a day","a few times a week","a few times a month"} BP range:  BP medication side effects: {Blank single:19197::"yes","no"} Past BP meds: Duration of hyperlipidemia: {Blank single:19197::"chronic","months","years"} Cholesterol medication side effects: {Blank single:19197::"yes","no"} Cholesterol supplements:  Past cholesterol medications:  Aspirin: {Blank single:19197::"yes","no"} Recent stressors: {Blank single:19197::"yes","no"} Recurrent headaches: {Blank single:19197::"yes","no"} Visual changes: {Blank single:19197::"yes","no"} Palpitations: {Blank single:19197::"yes","no"} Dyspnea: {Blank single:19197::"yes","no"} Chest pain: {Blank single:19197::"yes","no"} Lower extremity edema: {Blank single:19197::"yes","no"} Dizzy/lightheaded: {Blank single:19197::"yes","no"}  COPD Currently taking Breo, albuterol.  Also has allergies controlled with Zyrtec and Rhinocort. COPD status: {Blank single:19197::"controlled","uncontrolled","better","worse","exacerbated","stable"} Satisfied with current treatment?: {Blank single:19197::"yes","no"} Oxygen use: {Blank single:19197::"yes","no"} Dyspnea frequency:  Cough frequency:  Rescue inhaler frequency:   Limitation of activity: {Blank single:19197::"yes","no"} Productive cough:  Last Spirometry:  Pneumovax: {Blank single:19197::"Up to Date","Not up to Date","unknown"} Influenza: {Blank single:19197::"Up to  Date","Not up to Date","unknown"}  TOBACCO USER Smoking Status: Smoking Amount: Smoking Onset:  Smoking Quit Date:  Smoking triggers: Type of tobacco use:  Children in the house: {Blank single:19197::"yes","no"} Other household members who smoke: {Blank single:19197::"yes","no"} Treatments attempted:  Pneumovax:   VITAMIN D DEFICIENCY Currently taking  Duration: Previous Vitamin D level: Current supplementation: Complications of low vitamin d: Risk factors:  GERD Currently taking omeprazole 20 mg daily. GERD control status: {Blank single:19197::"controlled","uncontrolled","better","worse","exacerbated","stable"} Satisfied with current treatment? {Blank single:19197::"yes","no"} Heartburn frequency:  Medication side effects: {Blank single:19197::"yes","no"}  Medication compliance: {Blank multiple:19196::"better","worse","stable","fluctuating"} Previous GERD medications: Antacid use frequency:   Duration:  Nature:  Location:  Heartburn duration:  Alleviatiating factors:   Aggravating factors:  Dysphagia: {Blank single:19197::"yes","no"} Odynophagia:  {Blank single:19197::"yes","no"} Hematemesis: {Blank single:19197::"yes","no"} Blood in stool: {Blank single:19197::"yes","no"} EGD: {Blank single:19197::"yes","no"}  Allergies  Allergen Reactions   Other Diarrhea and Rash   Penicillins Anaphylaxis    "whelps"   Erythromycin    Azithromycin Diarrhea    GI Upset    Codeine Itching and Swelling    "skin crawls'   Neosporin [Neomycin-Bacitracin Zn-Polymyx]     "blisters the skin"   Percocet [Oxycodone-Acetaminophen]     Nausea and vomiting   Statins Other (See Comments)    Mental status changes, myalgias   Sulfamethoxazole-Trimethoprim    Wellbutrin [Bupropion] Hives    Outpatient Encounter Medications as of 09/05/2021  Medication Sig   albuterol (VENTOLIN HFA) 108 (90 Base) MCG/ACT inhaler Inhale 1-2 puffs into the lungs every 6 (six) hours as needed for  wheezing or shortness of breath.   amLODipine (NORVASC) 5 MG tablet Take 1 tablet (5 mg total) by mouth daily.   BREO ELLIPTA 100-25 MCG/INH AEPB TAKE 1 PUFF BY MOUTH EVERY DAY   budesonide (RHINOCORT AQUA) 32 MCG/ACT nasal spray EACH NARE   cetirizine (ZYRTEC) 10 MG tablet Take 1 tablet (10 mg total) by mouth daily.   Cholecalciferol (DIALYVITE VITAMIN D 5000) 125 MCG (5000 UT) capsule Take 1 capsule (5,000 Units total) by mouth daily.   ezetimibe (ZETIA) 10 MG tablet Take 1 tablet (10 mg total) by mouth daily.   omeprazole (PRILOSEC) 20 MG capsule TAKE 1 CAPSULE BY MOUTH EVERY DAY   Propylene Glycol 0.6 % SOLN Place 2 drops into both  eyes daily.   No facility-administered encounter medications on file as of 09/05/2021.    Patient Active Problem List   Diagnosis Date Noted   Carotid arterial disease (HCC) 07/25/2019   Cough 01/31/2019   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   Perennial allergic rhinitis 01/31/2019   Laryngopharyngeal reflux (LPR) 01/31/2019   Tobacco dependence due to cigarettes 01/31/2019   Hyperlipidemia 07/22/2018   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   Chest pain 04/11/2016   Essential hypertension, benign 07/27/2014   Anxiety 07/27/2014   Vitamin D deficiency 07/27/2014   Smoker 07/27/2014    Past Medical History:  Diagnosis Date   Anxiety    Arthritis    Asthma    [employer]reesia 02/18/2021   Bronchitis    hx of   Chronic neck pain    GERD (gastroesophageal reflux disease)    Headache(784.0)    migraines   Hypertension    controlled by diet and exercise   Pneumonia    hx of    Relevant past medical, surgical, family and social history reviewed and updated as indicated. Interim medical history since our last visit reviewed.  Review of Systems Per HPI unless specifically indicated above     Objective:    There were no vitals taken for this visit.  Wt Readings from Last 3 Encounters:  03/18/21 117 lb (53.1 kg)  03/05/21 117 lb 3.2 oz (53.2 kg)  02/19/21 115 lb  3.2 oz (52.3 kg)    Physical Exam  Results for orders placed or performed in visit on 03/05/21  PAP,TP IMGw/HPV RNA,rflx [id number],95/63  Result Value Ref Range   Clinical Information:     LMP:     PREV. PAP:     PREV. BX:     HPV DNA Probe-Source     STATEMENT OF ADEQUACY:     INTERPRETATION/RESULT:     Comment:     CYTOTECHNOLOGIST:     REVIEW CYTOTECHNOLOGIST:     HPV DNA High Risk Not Detected Not Detect      Assessment & Plan:   Problem List Items Addressed This Visit   None    Follow up plan: No follow-ups on file.

## 2021-09-05 ENCOUNTER — Ambulatory Visit: Payer: BC Managed Care – PPO | Admitting: Nurse Practitioner

## 2021-09-05 DIAGNOSIS — J449 Chronic obstructive pulmonary disease, unspecified: Secondary | ICD-10-CM

## 2021-09-05 DIAGNOSIS — K219 Gastro-esophageal reflux disease without esophagitis: Secondary | ICD-10-CM

## 2021-09-05 DIAGNOSIS — I10 Essential (primary) hypertension: Secondary | ICD-10-CM

## 2021-09-05 DIAGNOSIS — E559 Vitamin D deficiency, unspecified: Secondary | ICD-10-CM

## 2021-09-05 DIAGNOSIS — E7800 Pure hypercholesterolemia, unspecified: Secondary | ICD-10-CM

## 2021-09-05 DIAGNOSIS — F17200 Nicotine dependence, unspecified, uncomplicated: Secondary | ICD-10-CM

## 2021-09-15 NOTE — Progress Notes (Signed)
MRN : [id number]  Toni Mcdonald is a 60 y.o. (07/19/1961) female who presents with chief complaint of check carotids.  History of Present Illness:   The patient is seen for evaluation of carotid stenosis. The carotid stenosis was identified remotely but she has not had recent follow up.   The patient denies amaurosis fugax. There is no recent history of TIA symptoms or focal motor deficits. There is no prior documented CVA.   There is no history of migraine headaches. There is no history of seizures.   The patient is taking enteric-coated aspirin 81 mg daily.   The patient has a history of coronary artery disease, no recent episodes of angina or shortness of breath. The patient denies PAD or claudication symptoms. There is a history of hyperlipidemia which is being treated with a statin.    Duplex ultrasound of the carotid arteries obtained today demonstrates RICA=40-59% and LICA =60-79%.  No significant change when compared to the previous study  No outpatient medications have been marked as taking for the 09/16/21 encounter (Appointment) with Gilda Crease, Latina Craver, MD.    Past Medical History:  Diagnosis Date   Anxiety    Arthritis    Asthma    Phreesia 02/18/2021   Bronchitis    hx of   Chronic neck pain    GERD (gastroesophageal reflux disease)    Headache(784.0)    migraines   Hypertension    controlled by diet and exercise   Pneumonia    hx of    Past Surgical History:  Procedure Laterality Date   ABDOMINAL HYSTERECTOMY     Has both ovaries   ANTERIOR CERVICAL DECOMP/DISCECTOMY FUSION  09/22/2012   Procedure: ANTERIOR CERVICAL DECOMPRESSION/DISCECTOMY FUSION 3 LEVELS;  Surgeon: Tia Alert, MD;  Location: [facility];  Service: Neurosurgery;  Laterality: N/A;  Anterior Cervical Diskectomy/Fusion with Plate, Cervical four through seven   CARDIOVASCULAR STRESS TEST     2008   COLONOSCOPY WITH PROPOFOL N/A 07/10/2016   Procedure: COLONOSCOPY WITH PROPOFOL;   Surgeon: Midge Minium, MD;  Location: [facility];  Service: Endoscopy;  Laterality: N/A;   DIAGNOSTIC LAPAROSCOPY     exploratory surgery due to "scarring"   TUBAL LIGATION      Social History Social History   Tobacco Use   Smoking status: Every Day    Packs/day: 1.00    Years: 35.00    Pack years: 35.00    Types: Cigarettes   Smokeless tobacco: Never   Tobacco comments:    pack per day 02.24.20  Substance Use Topics   Alcohol use: Yes    Alcohol/week: 4.0 standard drinks    Types: 4 Glasses of wine per week    Comment: occasional   Drug use: No    Family History Family History  Problem Relation Age of Onset   Arthritis Mother    Heart disease Mother    Hyperlipidemia Mother    Hypertension Mother    Stroke Mother    Heart disease Father    Hyperlipidemia Father    Hypertension Father    Kidney disease Father    Stroke Father    Thyroid disease Sister    Thyroid disease Sister    Thyroid disease Sister    Vision loss Maternal Grandfather    Early death Brother    Heart disease Brother 18       Died at 49 with MI   Heart attack Brother     Allergies  Allergen Reactions   Other Diarrhea and Rash   Penicillins Anaphylaxis    "whelps"   Erythromycin    Azithromycin Diarrhea    GI Upset    Codeine Itching and Swelling    "skin crawls'   Neosporin [Neomycin-Bacitracin Zn-Polymyx]     "blisters the skin"   Percocet [Oxycodone-Acetaminophen]     Nausea and vomiting   Statins Other (See Comments)    Mental status changes, myalgias   Sulfamethoxazole-Trimethoprim    Wellbutrin [Bupropion] Hives     REVIEW OF SYSTEMS (Negative unless checked)  Constitutional: [] Weight loss  [] Fever  [] Chills Cardiac: [] Chest pain   [] Chest pressure   [] Palpitations   [] Shortness of breath when laying flat   [] Shortness of breath with exertion. Vascular:  [] Pain in legs with walking   [] Pain in legs at rest  [] History of DVT   [] Phlebitis   [] Swelling in legs    [] Varicose veins   [] Non-healing ulcers Pulmonary:   [] Uses home oxygen   [] Productive cough   [] Hemoptysis   [] Wheeze  [] COPD   [] Asthma Neurologic:  [] Dizziness   [] Seizures   [] History of stroke   [] History of TIA  [] Aphasia   [] Vissual changes   [] Weakness or numbness in arm   [] Weakness or numbness in leg Musculoskeletal:   [] Joint swelling   [] Joint pain   [] Low back pain Hematologic:  [] Easy bruising  [] Easy bleeding   [] Hypercoagulable state   [] Anemic Gastrointestinal:  [] Diarrhea   [] Vomiting  [] Gastroesophageal reflux/heartburn   [] Difficulty swallowing. Genitourinary:  [] Chronic kidney disease   [] Difficult urination  [] Frequent urination   [] Blood in urine Skin:  [] Rashes   [] Ulcers  Psychological:  [] History of anxiety   []  History of major depression.  Physical Examination  There were no vitals filed for this visit. There is no height or weight on file to calculate BMI. Gen: WD/WN, NAD Head: [place]/AT, No temporalis wasting.  Ear/Nose/Throat: Hearing grossly intact, nares w/o erythema or drainage Eyes: PER, EOMI, sclera nonicteric.  Neck: Supple, no masses.  No bruit or JVD.  Pulmonary:  Good air movement, no audible wheezing, no use of accessory muscles.  Cardiac: RRR, normal S1, S2, no Murmurs. Vascular: High-pitched left carotid bruit Vessel Right Left  Radial Palpable Palpable  Carotid Palpable Palpable  Gastrointestinal: soft, non-distended. No guarding/no peritoneal signs.  Musculoskeletal: M/S 5/5 throughout.  No visible deformity.  Neurologic: CN 2-12 intact. Pain and light touch intact in extremities.  Symmetrical.  Speech is fluent. Motor exam as listed above. Psychiatric: Judgment intact, Mood & affect appropriate for pt's clinical situation. Dermatologic: No rashes or ulcers noted.  No changes consistent with cellulitis.   CBC Lab Results  Component Value Date   WBC 11.4 (H) 07/25/2019   HGB 15.0 07/25/2019   HCT 45.2 (H) 07/25/2019   MCV 89.2 07/25/2019    PLT 495 (H) 07/25/2019    BMET    Component Value Date/Time   NA 139 02/19/2021 0835   K 4.7 02/19/2021 0835   CL 105 02/19/2021 0835   CO2 27 02/19/2021 0835   GLUCOSE 83 02/19/2021 0835   BUN 8 02/19/2021 0835   CREATININE 0.76 02/19/2021 0835   CALCIUM 9.8 02/19/2021 0835   GFRNONAA 87 02/19/2021 0835   GFRAA 101 02/19/2021 0835   CrCl cannot be calculated (Patient's most recent lab result is older than the maximum 21 days allowed.).  COAG Lab Results  Component Value Date   INR 1.0 06/17/2016   INR 0.92 09/15/2012  Radiology No results found.   Assessment/Plan 1. Bilateral carotid artery occlusion Recommend:  Given the patient's asymptomatic subcritical stenosis no further invasive testing or surgery at this time.  Duplex ultrasound shows 1-39% RICA and 60-79% LICA stenosis.  Continue antiplatelet therapy as prescribed Continue management of CAD, HTN and Hyperlipidemia Healthy heart diet,  encouraged exercise at least 4 times per week Follow up in 6 months with duplex ultrasound and physical exam   - VAS US CAROTID; Future  2. Essential hypertension, benign Continue antihypertensive medications as already ordered, these medications have been reviewed and there are no changes at this time.   3. Pure hypercholesterolemia Continue statin as ordered and reviewed, no changes at this time   4. COPD with asthma (HCC) Continue pulmonary medications and aerosols as already ordered, these medications have been reviewed and there are no changes at this time.       Levora Dredge, MD  09/15/2021 8:35 PM

## 2021-09-16 ENCOUNTER — Ambulatory Visit (INDEPENDENT_AMBULATORY_CARE_PROVIDER_SITE_OTHER): Payer: BC Managed Care – PPO | Admitting: Vascular Surgery

## 2021-09-16 ENCOUNTER — Encounter (INDEPENDENT_AMBULATORY_CARE_PROVIDER_SITE_OTHER): Payer: Self-pay | Admitting: Vascular Surgery

## 2021-09-16 ENCOUNTER — Other Ambulatory Visit: Payer: Self-pay

## 2021-09-16 ENCOUNTER — Ambulatory Visit (INDEPENDENT_AMBULATORY_CARE_PROVIDER_SITE_OTHER): Payer: BC Managed Care – PPO

## 2021-09-16 VITALS — BP 118/91 | HR 97 | Resp 18 | Ht 60.0 in | Wt 118.0 lb

## 2021-09-16 DIAGNOSIS — I6523 Occlusion and stenosis of bilateral carotid arteries: Secondary | ICD-10-CM | POA: Diagnosis not present

## 2021-09-16 DIAGNOSIS — I10 Essential (primary) hypertension: Secondary | ICD-10-CM | POA: Diagnosis not present

## 2021-09-16 DIAGNOSIS — E7800 Pure hypercholesterolemia, unspecified: Secondary | ICD-10-CM

## 2021-09-16 DIAGNOSIS — J449 Chronic obstructive pulmonary disease, unspecified: Secondary | ICD-10-CM | POA: Diagnosis not present

## 2021-09-16 MED ORDER — CLOPIDOGREL BISULFATE 75 MG PO TABS: 75.0000 mg | ORAL_TABLET | Freq: Every day | ORAL | 11 refills | Status: DC

## 2022-01-15 ENCOUNTER — Other Ambulatory Visit: Payer: Self-pay | Admitting: Nurse Practitioner

## 2022-01-15 DIAGNOSIS — I10 Essential (primary) hypertension: Secondary | ICD-10-CM

## 2022-02-11 ENCOUNTER — Other Ambulatory Visit: Payer: Self-pay | Admitting: Nurse Practitioner

## 2022-02-11 DIAGNOSIS — E7800 Pure hypercholesterolemia, unspecified: Secondary | ICD-10-CM

## 2022-03-14 ENCOUNTER — Telehealth: Payer: Self-pay

## 2022-03-14 ENCOUNTER — Other Ambulatory Visit (INDEPENDENT_AMBULATORY_CARE_PROVIDER_SITE_OTHER): Payer: Self-pay | Admitting: Vascular Surgery

## 2022-03-14 DIAGNOSIS — I6523 Occlusion and stenosis of bilateral carotid arteries: Secondary | ICD-10-CM

## 2022-03-14 NOTE — Telephone Encounter (Signed)
Pharmacy faxed a prior authorization/refill for  ? ?ezetimibe (ZETIA) 10 MG tablet [[id number]]  ? ?Dose, Route, Frequency: As Directed  ?Dispense Quantity: 90 tablet Refills: 3   ?     ?Sig: TAKE 1 TABLET BY MOUTH EVERY DAY  ?     ?Start Date: 02/11/22 End Date: --  ?Written Date: 02/11/22 Expiration Date: 02/11/23  ?   ?Diagnosis Association: Pure hypercholesterolemia (E78.00)  ?Original Order:  ezetimibe (ZETIA) 10 MG tablet [[id number]]  ? ?

## 2022-03-14 NOTE — Telephone Encounter (Signed)
PA for Zetia started via CoverMyMeds ? ?Wynonia Musty Key: [id number] - Rx #: [id number] ?

## 2022-03-16 NOTE — Progress Notes (Signed)
? ? ?MRN : [id number] ? ?Toni Mcdonald is a 61 y.o. ([birth year]) female who presents with chief complaint of check carotid arteries. ? ?History of Present Illness:  ? ?The patient is seen for evaluation of carotid stenosis. The carotid stenosis was identified remotely but she has not had recent follow up. ?  ?The patient denies amaurosis fugax. There is no recent history of TIA symptoms or focal motor deficits. There is no prior documented CVA. ?  ?There is no history of migraine headaches. There is no history of seizures. ?  ?The patient is taking enteric-coated aspirin 81 mg daily. ?  ?The patient has a history of coronary artery disease, no recent episodes of angina or shortness of breath. ?The patient denies PAD or claudication symptoms. ?There is a history of hyperlipidemia which is being treated with a statin.  ?  ?Duplex ultrasound of the carotid arteries obtained today demonstrates RICA=1-39% and LICA =40-59%.  No significant change when compared to the previous study (perhaps a bit better) ? ?No outpatient medications have been marked as taking for the 03/17/22 encounter (Appointment) with Gilda Crease, Latina Craver, MD.  ? ? ?Past Medical History:  ?Diagnosis Date  ? Anxiety   ? Arthritis   ? Asthma   ? Phreesia 02/18/2021  ? Bronchitis   ? hx of  ? Chronic neck pain   ? GERD (gastroesophageal reflux disease)   ? Headache(784.0)   ? migraines  ? Hypertension   ? controlled by diet and exercise  ? Pneumonia   ? hx of  ? ? ?Past Surgical History:  ?Procedure Laterality Date  ? ABDOMINAL HYSTERECTOMY    ? Has both ovaries  ? ANTERIOR CERVICAL DECOMP/DISCECTOMY FUSION  09/22/2012  ? Procedure: ANTERIOR CERVICAL DECOMPRESSION/DISCECTOMY FUSION 3 LEVELS;  Surgeon: Tia Alert, MD;  Location: [facility];  Service: Neurosurgery;  Laterality: N/A;  Anterior Cervical Diskectomy/Fusion with Plate, Cervical four through seven  ? CARDIOVASCULAR STRESS TEST    ? 2008  ? COLONOSCOPY WITH PROPOFOL N/A 07/10/2016  ?  Procedure: COLONOSCOPY WITH PROPOFOL;  Surgeon: Midge Minium, MD;  Location: [facility];  Service: Endoscopy;  Laterality: N/A;  ? DIAGNOSTIC LAPAROSCOPY    ? exploratory surgery due to "scarring"  ? TUBAL LIGATION    ? ? ?Social History ?Social History  ? ?Tobacco Use  ? Smoking status: Every Day  ?  Packs/day: 1.00  ?  Years: 35.00  ?  Pack years: 35.00  ?  Types: Cigarettes  ? Smokeless tobacco: Never  ? Tobacco comments:  ?  pack per day 02.24.20  ?Substance Use Topics  ? Alcohol use: Yes  ?  Alcohol/week: 4.0 standard drinks  ?  Types: 4 Glasses of wine per week  ?  Comment: occasional  ? Drug use: No  ? ? ?Family History ?Family History  ?Problem Relation Age of Onset  ? Arthritis Mother   ? Heart disease Mother   ? Hyperlipidemia Mother   ? Hypertension Mother   ? Stroke Mother   ? Heart disease Father   ? Hyperlipidemia Father   ? Hypertension Father   ? Kidney disease Father   ? Stroke Father   ? Thyroid disease Sister   ? Thyroid disease Sister   ? Thyroid disease Sister   ? Vision loss Maternal Grandfather   ? Early death Brother   ? Heart disease Brother 72  ?     Died at 54 with MI  ? Heart attack Brother   ? ? ?  Allergies  ?Allergen Reactions  ? Other Diarrhea and Rash  ? Penicillins Anaphylaxis  ?  "whelps"  ? Erythromycin   ? Azithromycin Diarrhea  ?  GI Upset ?  ? Codeine Itching and Swelling  ?  "skin crawls'  ? Neosporin [Neomycin-Bacitracin Zn-Polymyx]   ?  "blisters the skin"  ? Percocet [Oxycodone-Acetaminophen]   ?  Nausea and vomiting  ? Statins Other (See Comments)  ?  Mental status changes, myalgias  ? Sulfamethoxazole-Trimethoprim   ? Wellbutrin [Bupropion] Hives  ? ? ? ?REVIEW OF SYSTEMS (Negative unless checked) ? ?Constitutional: [] Weight loss  [] Fever  [] Chills ?Cardiac: [] Chest pain   [] Chest pressure   [] Palpitations   [] Shortness of breath when laying flat   [] Shortness of breath with exertion. ?Vascular:  [] Pain in legs with walking   [] Pain in legs at rest  [] History of DVT    [] Phlebitis   [] Swelling in legs   [] Varicose veins   [] Non-healing ulcers ?Pulmonary:   [] Uses home oxygen   [] Productive cough   [] Hemoptysis   [] Wheeze  [] COPD   [] Asthma ?Neurologic:  [] Dizziness   [] Seizures   [] History of stroke   [] History of TIA  [] Aphasia   [] Vissual changes   [] Weakness or numbness in arm   [] Weakness or numbness in leg ?Musculoskeletal:   [] Joint swelling   [] Joint pain   [] Low back pain ?Hematologic:  [] Easy bruising  [] Easy bleeding   [] Hypercoagulable state   [] Anemic ?Gastrointestinal:  [] Diarrhea   [] Vomiting  [] Gastroesophageal reflux/heartburn   [] Difficulty swallowing. ?Genitourinary:  [] Chronic kidney disease   [] Difficult urination  [] Frequent urination   [] Blood in urine ?Skin:  [] Rashes   [] Ulcers  ?Psychological:  [] History of anxiety   []  History of major depression. ? ?Physical Examination ? ?There were no vitals filed for this visit. ?There is no height or weight on file to calculate BMI. ?Gen: WD/WN, NAD ?Head: [place]/AT, No temporalis wasting.  ?Ear/Nose/Throat: Hearing grossly intact, nares w/o erythema or drainage ?Eyes: PER, EOMI, sclera nonicteric.  ?Neck: Supple, no masses.  No bruit or JVD.  ?Pulmonary:  Good air movement, no audible wheezing, no use of accessory muscles.  ?Cardiac: RRR, normal S1, S2, no Murmurs. ?Vascular:   left carotid bruit ?Vessel Right Left  ?Radial Palpable Palpable  ?Carotid Palpable Palpable  ?Gastrointestinal: soft, non-distended. No guarding/no peritoneal signs.  ?Musculoskeletal: M/S 5/5 throughout.  No visible deformity.  ?Neurologic: CN 2-12 intact. Pain and light touch intact in extremities.  Symmetrical.  Speech is fluent. Motor exam as listed above. ?Psychiatric: Judgment intact, Mood & affect appropriate for pt's clinical situation. ?Dermatologic: No rashes or ulcers noted.  No changes consistent with cellulitis. ? ? ?CBC ?Lab Results  ?Component Value Date  ? WBC 11.4 (H) 07/25/2019  ? HGB 15.0 07/25/2019  ? HCT 45.2 (H)  07/25/2019  ? MCV 89.2 07/25/2019  ? PLT 495 (H) 07/25/2019  ? ? ?BMET ?   ?Component Value Date/Time  ? NA 139 02/19/2021 0835  ? K 4.7 02/19/2021 0835  ? CL 105 02/19/2021 0835  ? CO2 27 02/19/2021 0835  ? GLUCOSE 83 02/19/2021 0835  ? BUN 8 02/19/2021 0835  ? CREATININE 0.76 02/19/2021 0835  ? CALCIUM 9.8 02/19/2021 0835  ? GFRNONAA 87 02/19/2021 0835  ? GFRAA 101 02/19/2021 0835  ? ?CrCl cannot be calculated (Patient's most recent lab result is older than the maximum 21 days allowed.). ? ?COAG ?Lab Results  ?Component Value Date  ? INR 1.0 06/17/2016  ?  INR 0.92 09/15/2012  ? ? ?Radiology ?No results found. ? ? ?Assessment/Plan ?1. Bilateral carotid artery occlusion ?Recommend: ? ?Given the patient's asymptomatic subcritical stenosis no further invasive testing or surgery at this time. ? ?Duplex ultrasound of the carotid arteries obtained today demonstrates RICA=1-39% and LICA =40-59%.  No significant change when compared to the previous study (perhaps a bit better) ? ?Continue antiplatelet therapy as prescribed ?Continue management of CAD, HTN and Hyperlipidemia ?Healthy heart diet,  encouraged exercise at least 4 times per week ?Follow up in 12 months with duplex ultrasound and physical exam   ?- VAS US CAROTID; Future ? ?2. Essential hypertension, benign ?Continue antihypertensive medications as already ordered, these medications have been reviewed and there are no changes at this time.  ? ?3. COPD with asthma (HCC) ?Continue pulmonary medications and aerosols as already ordered, these medications have been reviewed and there are no changes at this time. ?  ? ?4. Pure hypercholesterolemia ?Continue statin as ordered and reviewed, no changes at this time  ? ? ? ?Levora Dredge, MD ? ?03/16/2022 ?4:22 PM ? ?  ?

## 2022-03-17 ENCOUNTER — Other Ambulatory Visit: Payer: Self-pay

## 2022-03-17 ENCOUNTER — Ambulatory Visit (INDEPENDENT_AMBULATORY_CARE_PROVIDER_SITE_OTHER): Payer: BC Managed Care – PPO | Admitting: Vascular Surgery

## 2022-03-17 ENCOUNTER — Encounter (INDEPENDENT_AMBULATORY_CARE_PROVIDER_SITE_OTHER): Payer: Self-pay | Admitting: Vascular Surgery

## 2022-03-17 ENCOUNTER — Ambulatory Visit (INDEPENDENT_AMBULATORY_CARE_PROVIDER_SITE_OTHER): Payer: BC Managed Care – PPO

## 2022-03-17 VITALS — BP 125/83 | HR 81 | Resp 16 | Wt 111.8 lb

## 2022-03-17 DIAGNOSIS — I10 Essential (primary) hypertension: Secondary | ICD-10-CM

## 2022-03-17 DIAGNOSIS — I6523 Occlusion and stenosis of bilateral carotid arteries: Secondary | ICD-10-CM

## 2022-03-17 DIAGNOSIS — J449 Chronic obstructive pulmonary disease, unspecified: Secondary | ICD-10-CM | POA: Diagnosis not present

## 2022-03-17 DIAGNOSIS — E7800 Pure hypercholesterolemia, unspecified: Secondary | ICD-10-CM | POA: Diagnosis not present

## 2022-03-17 NOTE — Telephone Encounter (Signed)
Approved today ?OQHUTM:[id number];SFKCLE:[id number];Review Type:Prior Auth;Coverage Start Date:02/15/2022;Coverage End Date:03/17/2023 ?

## 2022-04-02 ENCOUNTER — Ambulatory Visit (INDEPENDENT_AMBULATORY_CARE_PROVIDER_SITE_OTHER): Payer: Self-pay | Admitting: Family

## 2022-04-02 ENCOUNTER — Encounter: Payer: Self-pay | Admitting: Family

## 2022-04-02 VITALS — BP 122/82 | HR 88 | Temp 99.2°F | Resp 16 | Ht 60.0 in | Wt 110.5 lb

## 2022-04-02 DIAGNOSIS — K219 Gastro-esophageal reflux disease without esophagitis: Secondary | ICD-10-CM

## 2022-04-02 DIAGNOSIS — M816 Localized osteoporosis [Lequesne]: Secondary | ICD-10-CM

## 2022-04-02 DIAGNOSIS — J449 Chronic obstructive pulmonary disease, unspecified: Secondary | ICD-10-CM

## 2022-04-02 DIAGNOSIS — Z789 Other specified health status: Secondary | ICD-10-CM

## 2022-04-02 DIAGNOSIS — E559 Vitamin D deficiency, unspecified: Secondary | ICD-10-CM

## 2022-04-02 DIAGNOSIS — Z1231 Encounter for screening mammogram for malignant neoplasm of breast: Secondary | ICD-10-CM

## 2022-04-02 DIAGNOSIS — L80 Vitiligo: Secondary | ICD-10-CM | POA: Insufficient documentation

## 2022-04-02 DIAGNOSIS — E538 Deficiency of other specified B group vitamins: Secondary | ICD-10-CM

## 2022-04-02 DIAGNOSIS — J441 Chronic obstructive pulmonary disease with (acute) exacerbation: Secondary | ICD-10-CM

## 2022-04-02 DIAGNOSIS — J3089 Other allergic rhinitis: Secondary | ICD-10-CM

## 2022-04-02 DIAGNOSIS — T466X5A Adverse effect of antihyperlipidemic and antiarteriosclerotic drugs, initial encounter: Secondary | ICD-10-CM

## 2022-04-02 DIAGNOSIS — F17200 Nicotine dependence, unspecified, uncomplicated: Secondary | ICD-10-CM

## 2022-04-02 DIAGNOSIS — E782 Mixed hyperlipidemia: Secondary | ICD-10-CM

## 2022-04-02 DIAGNOSIS — G720 Drug-induced myopathy: Secondary | ICD-10-CM

## 2022-04-02 DIAGNOSIS — K648 Other hemorrhoids: Secondary | ICD-10-CM

## 2022-04-02 DIAGNOSIS — Z0000 Encounter for general adult medical examination without abnormal findings: Secondary | ICD-10-CM

## 2022-04-02 DIAGNOSIS — I10 Essential (primary) hypertension: Secondary | ICD-10-CM

## 2022-04-02 LAB — LIPID PANEL
Cholesterol: 187 mg/dL (ref 0–200)
HDL: 78.1 mg/dL (ref >39.00)
LDL Cholesterol: 92 mg/dL (ref 0–99)
NonHDL: 109.02
Total CHOL/HDL Ratio: 2
Triglycerides: 83 mg/dL (ref 0.0–149.0)
VLDL: 16.6 mg/dL (ref 0.0–40.0)

## 2022-04-02 LAB — VITAMIN D 25 HYDROXY (VIT D DEFICIENCY, FRACTURES): VITD: 58.28 ng/mL (ref 30.00–100.00)

## 2022-04-02 LAB — COMPREHENSIVE METABOLIC PANEL
ALT: 17 U/L (ref 0–35)
AST: 20 U/L (ref 0–37)
Albumin: 4.5 g/dL (ref 3.5–5.2)
Alkaline Phosphatase: 123 U/L — ABNORMAL HIGH (ref 39–117)
BUN: 11 mg/dL (ref 6–23)
CO2: 28 mEq/L (ref 19–32)
Calcium: 9.8 mg/dL (ref 8.4–10.5)
Chloride: 105 mEq/L (ref 96–112)
Creatinine, Ser: 0.78 mg/dL (ref 0.40–1.20)
GFR: 83.38 mL/min (ref >60.00)
Glucose, Bld: 93 mg/dL (ref 70–99)
Potassium: 5.2 mEq/L — ABNORMAL HIGH (ref 3.5–5.1)
Sodium: 139 mEq/L (ref 135–145)
Total Bilirubin: 0.5 mg/dL (ref 0.2–1.2)
Total Protein: 6.4 g/dL (ref 6.0–8.3)

## 2022-04-02 LAB — MICROALBUMIN / CREATININE URINE RATIO
Creatinine,U: 87.9 mg/dL
Microalb Creat Ratio: 0.8 mg/g (ref 0.0–30.0)
Microalb, Ur: <0.7 mg/dL (ref 0.0–1.9)

## 2022-04-02 LAB — CBC WITH DIFFERENTIAL/PLATELET
Basophils Absolute: 0.1 10*3/uL (ref 0.0–0.1)
Basophils Relative: 1.2 % (ref 0.0–3.0)
Eosinophils Absolute: 0.1 10*3/uL (ref 0.0–0.7)
Eosinophils Relative: 1.7 % (ref 0.0–5.0)
HCT: 44.2 % (ref 36.0–46.0)
Hemoglobin: 14.4 g/dL (ref 12.0–15.0)
Lymphocytes Relative: 26.6 % (ref 12.0–46.0)
Lymphs Abs: 2.1 10*3/uL (ref 0.7–4.0)
MCHC: 32.5 g/dL (ref 30.0–36.0)
MCV: 87.6 fl (ref 78.0–100.0)
Monocytes Absolute: 0.7 10*3/uL (ref 0.1–1.0)
Monocytes Relative: 9.1 % (ref 3.0–12.0)
Neutro Abs: 4.9 10*3/uL (ref 1.4–7.7)
Neutrophils Relative %: 61.4 % (ref 43.0–77.0)
Platelets: 376 10*3/uL (ref 150.0–400.0)
RBC: 5.05 Mil/uL (ref 3.87–5.11)
RDW: 14.7 % (ref 11.5–15.5)
WBC: 8.1 10*3/uL (ref 4.0–10.5)

## 2022-04-02 LAB — B12 AND FOLATE PANEL
Folate: 6.2 ng/mL (ref >5.9)
Vitamin B-12: 436 pg/mL (ref 211–911)

## 2022-04-02 MED ORDER — VITAMIN D 50 MCG (2000 UT) PO CAPS: 1.0000 | ORAL_CAPSULE | Freq: Every day | ORAL | Status: AC

## 2022-04-02 NOTE — Patient Instructions (Addendum)
A referral was placed today for lung cancer screening program. ?Please let us know if you have not heard back within 1 week about your referral. ? ?Call [facility] [facility] to schedule your mammogram as well as bone density scan as I have sent the electronic order to their facility.  ?Here is their number : [phone]  ? ?Stop by the lab prior to leaving today. I will notify you of your results once received.  ? ?Recommend daily calcium in addition to vitamin D.  ? ?It was a pleasure seeing you today! Please do not hesitate to reach out with any questions and or concerns. ? ?Regards,  ? ?Lesha Jager ?FNP-C ? ? ?

## 2022-04-02 NOTE — Progress Notes (Signed)
? ?Established Patient Office Visit ? ?Subjective:  ?Patient ID: Toni Mcdonald, female    DOB: 12/03/1961  Age: 61 y.o. MRN: [id number] ? ?CC:  ?Chief Complaint  ?Patient presents with  ?? Transitions Of Care  ? ? ?HPI ?Toni Mcdonald is here for a transition of care visit and complete physical exam. ? ?Prior provider was: Cathlean Marseilles, FNP  ?Pt is without acute concerns.  ? ?Pap 03/05/21 negative ?Pap 07/25/2019 ascus  ?Dexa: osteoporosis AP spine and left hip with osteopenia ?Smoker x 35 years, 1 ppd not in lung screening program  ?Colonscopy: July 2017, every ten years, internal hemor ?chronic concerns: ? ?Carotid stenosis: seeing vascular every six month however not annually as stable.  ? ?Copd with asthma, not taking breo. Has no taken since 2021. Only using albuterol once a month. Does not wake up with night time sob. Uses nose spray for allergies and as long as that is controlled pt states no symptom concerns. Also takes cetirizine otc.  ? ?Chronic dry eyes: uses propylene glycol daily  ? ?HTN: today 122/82. amlodipine 5 mg once daily. No cp palp or sob. Tries to limit salt in diet. ? ?Vitamin d def: taking vitamin D 2000 IU once daily.  ? ?Past Medical History:  ?Diagnosis Date  ?? Anxiety   ?? Arthritis   ?? Asthma   ? Phreesia 02/18/2021  ?? Bronchitis   ? hx of  ?? Chronic neck pain   ?? GERD (gastroesophageal reflux disease)   ?? Headache(784.0)   ? migraines  ?? Hypertension   ? controlled by diet and exercise  ?? Pneumonia   ? hx of  ? ? ?Past Surgical History:  ?Procedure Laterality Date  ?? ABDOMINAL HYSTERECTOMY    ? Has both ovaries, still with cervix  ?? ANTERIOR CERVICAL DECOMP/DISCECTOMY FUSION  09/22/2012  ? Procedure: ANTERIOR CERVICAL DECOMPRESSION/DISCECTOMY FUSION 3 LEVELS;  Surgeon: Tia Alert, MD;  Location: [facility];  Service: Neurosurgery;  Laterality: N/A;  Anterior Cervical Diskectomy/Fusion with Plate, Cervical four through seven  ?? CARDIOVASCULAR STRESS TEST    ?  2008  ?? COLONOSCOPY WITH PROPOFOL N/A 07/10/2016  ? Procedure: COLONOSCOPY WITH PROPOFOL;  Surgeon: Midge Minium, MD;  Location: [facility];  Service: Endoscopy;  Laterality: N/A;  ?? DIAGNOSTIC LAPAROSCOPY    ? exploratory surgery due to "scarring"  ?? TUBAL LIGATION    ? ? ?Family History  ?Problem Relation Age of Onset  ?? Arthritis Mother   ?? Heart disease Mother   ?? Hyperlipidemia Mother   ?? Hypertension Mother   ?? Stroke Mother   ?? Heart disease Father   ?? Hyperlipidemia Father   ?? Hypertension Father   ?? Kidney disease Father   ?? Stroke Father   ?? Hyperthyroidism Sister   ?? Hypothyroidism Sister   ?? Thyroid disease Sister   ?? Early death Brother   ?? Heart disease Brother 75  ?     Died at 70 with MI  ?? Heart attack Brother   ?? Vision loss Maternal Grandfather   ? ? ?Social History  ? ?Socioeconomic History  ?? Marital status: Married  ?  Spouse name: Not on file  ?? Number of children: 2  ?? Years of education: Not on file  ?? Highest education level: Not on file  ?Occupational History  ?? Not on file  ?Tobacco Use  ?? Smoking status: Every Day  ?  Packs/day: 1.00  ?  Years: 35.00  ?  Pack years: 35.00  ?  Types: Cigarettes  ?? Smokeless tobacco: Never  ?? Tobacco comments:  ?  pack per day 02.24.20  ?Vaping Use  ?? Vaping Use: Not on file  ?Substance and Sexual Activity  ?? Alcohol use: Yes  ?  Alcohol/week: 4.0 standard drinks  ?  Types: 4 Glasses of wine per week  ?  Comment: occasional  ?? Drug use: No  ?? Sexual activity: Yes  ?  Partners: Male  ?  Birth control/protection: Post-menopausal  ?Other Topics Concern  ?? Not on file  ?Social History Narrative  ? Married. 2 children/ two girls. 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 ?Social Determinants of Health  ? ?Financial Resource Strain: Not on file  ?Food Insecurity: Not on file  ?Transportation Needs: Not on file  ?Physical Activity: Not on file  ?Stress: Not on  file  ?Social Connections: Not on file  ?Intimate Partner Violence: Not on file  ? ? ?Outpatient Medications Prior to Visit  ?Medication Sig Dispense Refill  ?? albuterol (VENTOLIN HFA) 108 (90 Base) MCG/ACT inhaler Inhale 1-2 puffs into the lungs every 6 (six) hours as needed for wheezing or shortness of breath. 3 each 3  ?? amLODipine (NORVASC) 5 MG tablet TAKE 1 TABLET (5 MG TOTAL) BY MOUTH DAILY. 90 tablet 1  ?? budesonide (RHINOCORT AQUA) 32 MCG/ACT nasal spray EACH NARE 8.43 mL 0  ?? cetirizine (ZYRTEC) 10 MG tablet Take 1 tablet (10 mg total) by mouth daily. 30 tablet 11  ?? ezetimibe (ZETIA) 10 MG tablet TAKE 1 TABLET BY MOUTH EVERY DAY 90 tablet 3  ?? Propylene Glycol 0.6 % SOLN Place 2 drops into both eyes daily.    ?? vitamin B-12 (CYANOCOBALAMIN) 1000 MCG tablet Take 2,000 mcg by mouth daily.    ?? BREO ELLIPTA 100-25 MCG/INH AEPB TAKE 1 PUFF BY MOUTH EVERY DAY 60 each 0  ?? Cholecalciferol (DIALYVITE VITAMIN D 5000) 125 MCG (5000 UT) capsule Take 1 capsule (5,000 Units total) by mouth daily.    ?? clopidogrel (PLAVIX) 75 MG tablet Take 1 tablet (75 mg total) by mouth daily. (Patient not taking: Reported on 04/02/2022) 30 tablet 11  ?? omeprazole (PRILOSEC) 20 MG capsule TAKE 1 CAPSULE BY MOUTH EVERY DAY (Patient not taking: Reported on 04/02/2022) 90 capsule 1  ? ?No facility-administered medications prior to visit.  ? ? ?Allergies  ?Allergen Reactions  ?? Penicillins Anaphylaxis  ?  "whelps"  ?? Erythromycin   ?? Azithromycin Diarrhea  ?  GI Upset ?  ?? Clopidogrel Hives and Itching  ?? Codeine Itching and Swelling  ?  "skin crawls'  ?? Neosporin [Neomycin-Bacitracin Zn-Polymyx]   ?  "blisters the skin"  ?? Percocet [Oxycodone-Acetaminophen]   ?  Nausea and vomiting  ?? Statins Other (See Comments)  ?  Mental status changes, myalgias  ?? Sulfamethoxazole-Trimethoprim   ?? Wellbutrin [Bupropion] Hives  ? ? ?ROS ?Review of Systems ? ?Review of Systems  ?Respiratory:  Negative for shortness of breath.    ?Cardiovascular:  Negative for chest pain and palpitations.  ?Gastrointestinal:  Negative for constipation and diarrhea.  ?Genitourinary:  Negative for dysuria, frequency and urgency.  ?Musculoskeletal:  Negative for myalgias.  ?Psychiatric/Behavioral:  Negative for depression and suicidal ideas.   ?All other systems reviewed and are negative. ? ?  ?Objective:  ?  ?Physical Exam ?Vitals reviewed.  ?Constitutional:   ?   General: She is not in acute  distress. ?   Appearance: Normal appearance. She is normal weight. She is not ill-appearing, toxic-appearing or diaphoretic.  ?HENT:  ?   Right Ear: Tympanic membrane normal.  ?   Left Ear: Tympanic membrane normal.  ?   Mouth/Throat:  ?   Mouth: Mucous membranes are moist.  ?   Tongue: Lesions (yellow plaquing on tongue from smokingd) present.  ?   Pharynx: No pharyngeal swelling.  ?   Tonsils: No tonsillar exudate.  ?Eyes:  ?   Extraocular Movements: Extraocular movements intact.  ?   Conjunctiva/sclera: Conjunctivae normal.  ?   Pupils: Pupils are equal, round, and reactive to light.  ?Neck:  ?   Thyroid: No thyroid mass.  ?Cardiovascular:  ?   Rate and Rhythm: Normal rate and regular rhythm.  ?Pulmonary:  ?   Effort: Pulmonary effort is normal.  ?   Breath sounds: Normal breath sounds.  ?Abdominal:  ?   General: Abdomen is flat. Bowel sounds are normal.  ?   Palpations: Abdomen is soft.  ?Musculoskeletal:     ?   General: Normal range of motion.  ?Lymphadenopathy:  ?   Cervical:  ?   Right cervical: No superficial cervical adenopathy. ?   Left cervical: No superficial cervical adenopathy.  ?Skin: ?   General: Skin is warm.  ?   Capillary Refill: Capillary refill takes less than 2 seconds.  ?Neurological:  ?   General: No focal deficit present.  ?   Mental Status: She is alert and oriented to person, place, and time.  ?Psychiatric:     ?   Mood and Affect: Mood normal.     ?   Behavior: Behavior normal.     ?   Thought Content: Thought content normal.     ?    Judgment: Judgment normal.  ? ? ? ?BP 122/82   Pulse 88   Temp 99.2 ?F (37.3 ?C)   Resp 16   Ht 5' (1.524 m)   Wt 110 lb 8 oz (50.1 kg)   SpO2 99%   BMI 21.58 kg/m?  ?Wt Readings from Last 3 Encounters:  ?04/02/22 110

## 2022-04-03 ENCOUNTER — Telehealth: Payer: Self-pay

## 2022-04-03 NOTE — Progress Notes (Signed)
Potassium slightly high as well as alkaline phosphatase which is a liver function test.  Have you been taking any ibuprofen Motrin Tylenol alcohol and or herbal supplements in the last few days?  Do you take any potassium supplements and/or eat potassium such as melon cantaloupe and/or bananas?  Does she have any palpitations and/or chest pain? ? ?Cholesterol looks great ? ?I want patient to follow-up in 3 weeks to repeat the liver function as well as potassium

## 2022-04-03 NOTE — Telephone Encounter (Signed)
Left message to return call to our office.  

## 2022-04-06 DIAGNOSIS — Z0000 Encounter for general adult medical examination without abnormal findings: Secondary | ICD-10-CM | POA: Insufficient documentation

## 2022-04-06 NOTE — Assessment & Plan Note (Signed)
Ordered lipid panel, pending results. Work on low cholesterol diet and exercise as tolerated ? ?

## 2022-04-06 NOTE — Assessment & Plan Note (Signed)
continue vitamin D  ?Ordered vitamin d pending results ?

## 2022-04-06 NOTE — Assessment & Plan Note (Signed)
Stable ?Avoid straining and or constipation ?

## 2022-04-06 NOTE — Assessment & Plan Note (Signed)
Pt not breo as states stable ?Continue albuterol 108 mcg hfa prn  ?Refer to lung cancer screening clinic as ppd 35 years ?

## 2022-04-06 NOTE — Assessment & Plan Note (Signed)
Pt advised of the following:  ?Continue medication amlodipine 5 mg as prescribed. Monitor blood pressure periodically and/or when you feel symptomatic. Goal is <130/90 on average. Ensure that you have rested for 30 minutes prior to checking your blood pressure. Record your readings and bring them to your next visit if necessary.work on a low sodium diet. ? ?

## 2022-04-06 NOTE — Assessment & Plan Note (Signed)
Patient Counseling(The following topics were reviewed): ? Preventative care handout given to pt  ?Health maintenance and immunizations reviewed. Please refer to Health maintenance section. ?Pt advised on safe sex, wearing seatbelts in car, and proper nutrition ?labwork ordered today for annual ?Dental health: Discussed importance of regular tooth brushing, flossing, and dental visits. ? ? ?

## 2022-04-06 NOTE — Assessment & Plan Note (Signed)
May consider referral to cardiologist for possible repatha ?

## 2022-04-06 NOTE — Assessment & Plan Note (Signed)
Pt not taking breo and states feeling stable ?continue alubtero 108 mcg as needed  ?

## 2022-04-06 NOTE — Assessment & Plan Note (Signed)
Long d/w pt consider referral to cardiologist as high cholesterol, smoker= high risk for CAD/heart attack/stroke ?Consider repatha if referred ?pending lipid panel ?

## 2022-04-06 NOTE — Assessment & Plan Note (Signed)
Referred to lung cancer screening ?Smoking cessation instruction/counseling given:  counseled patient on the dangers of tobacco use, advised patient to stop smoking, and reviewed strategies to maximize success ? ?

## 2022-04-06 NOTE — Assessment & Plan Note (Addendum)
Ordered bone density pending results ?Consider fosamax if still osteoporotic  ?Did discuss potential with patient ?

## 2022-04-06 NOTE — Assessment & Plan Note (Signed)
Continue zyrtec 10 mg ?

## 2022-04-06 NOTE — Assessment & Plan Note (Signed)
Avoid chlorine ?

## 2022-04-06 NOTE — Assessment & Plan Note (Signed)
Mammogram ordered. Pending results. 

## 2022-04-06 NOTE — Assessment & Plan Note (Signed)
b12 ordered pending results 

## 2022-04-07 ENCOUNTER — Other Ambulatory Visit: Payer: Self-pay | Admitting: Family

## 2022-04-07 DIAGNOSIS — E875 Hyperkalemia: Secondary | ICD-10-CM

## 2022-04-07 DIAGNOSIS — R7989 Other specified abnormal findings of blood chemistry: Secondary | ICD-10-CM

## 2022-04-09 ENCOUNTER — Other Ambulatory Visit: Payer: Self-pay | Admitting: Nurse Practitioner

## 2022-04-09 DIAGNOSIS — J449 Chronic obstructive pulmonary disease, unspecified: Secondary | ICD-10-CM

## 2022-04-14 DIAGNOSIS — B9689 Other specified bacterial agents as the cause of diseases classified elsewhere: Secondary | ICD-10-CM | POA: Diagnosis not present

## 2022-04-14 DIAGNOSIS — J019 Acute sinusitis, unspecified: Secondary | ICD-10-CM | POA: Diagnosis not present

## 2022-04-17 ENCOUNTER — Ambulatory Visit: Payer: Self-pay | Admitting: Adult Health

## 2022-04-25 ENCOUNTER — Other Ambulatory Visit (INDEPENDENT_AMBULATORY_CARE_PROVIDER_SITE_OTHER): Payer: BC Managed Care – PPO

## 2022-04-25 DIAGNOSIS — R7989 Other specified abnormal findings of blood chemistry: Secondary | ICD-10-CM | POA: Diagnosis not present

## 2022-04-25 DIAGNOSIS — E875 Hyperkalemia: Secondary | ICD-10-CM

## 2022-04-25 LAB — COMPREHENSIVE METABOLIC PANEL
ALT: 18 U/L (ref 0–35)
AST: 20 U/L (ref 0–37)
Albumin: 4.3 g/dL (ref 3.5–5.2)
Alkaline Phosphatase: 117 U/L (ref 39–117)
BUN: 11 mg/dL (ref 6–23)
CO2: 28 mEq/L (ref 19–32)
Calcium: 9.1 mg/dL (ref 8.4–10.5)
Chloride: 104 mEq/L (ref 96–112)
Creatinine, Ser: 0.8 mg/dL (ref 0.40–1.20)
GFR: 80.85 mL/min (ref >60.00)
Glucose, Bld: 91 mg/dL (ref 70–99)
Potassium: 4.3 mEq/L (ref 3.5–5.1)
Sodium: 139 mEq/L (ref 135–145)
Total Bilirubin: 0.5 mg/dL (ref 0.2–1.2)
Total Protein: 6.5 g/dL (ref 6.0–8.3)

## 2022-06-17 ENCOUNTER — Other Ambulatory Visit: Payer: Self-pay | Admitting: Nurse Practitioner

## 2022-06-17 DIAGNOSIS — I10 Essential (primary) hypertension: Secondary | ICD-10-CM

## 2022-07-13 ENCOUNTER — Other Ambulatory Visit: Payer: Self-pay | Admitting: Nurse Practitioner

## 2022-07-13 DIAGNOSIS — J3089 Other allergic rhinitis: Secondary | ICD-10-CM

## 2022-09-23 ENCOUNTER — Other Ambulatory Visit: Payer: No Typology Code available for payment source

## 2022-10-02 ENCOUNTER — Encounter: Payer: Self-pay | Admitting: Family

## 2022-10-02 ENCOUNTER — Ambulatory Visit (INDEPENDENT_AMBULATORY_CARE_PROVIDER_SITE_OTHER): Payer: PRIVATE HEALTH INSURANCE | Admitting: Family

## 2022-10-02 VITALS — BP 120/80 | HR 87 | Temp 98.2°F | Resp 16 | Ht 60.0 in | Wt 118.4 lb

## 2022-10-02 DIAGNOSIS — J0110 Acute frontal sinusitis, unspecified: Secondary | ICD-10-CM

## 2022-10-02 DIAGNOSIS — Z20822 Contact with and (suspected) exposure to covid-19: Secondary | ICD-10-CM

## 2022-10-02 DIAGNOSIS — E538 Deficiency of other specified B group vitamins: Secondary | ICD-10-CM

## 2022-10-02 DIAGNOSIS — E782 Mixed hyperlipidemia: Secondary | ICD-10-CM

## 2022-10-02 DIAGNOSIS — R7989 Other specified abnormal findings of blood chemistry: Secondary | ICD-10-CM | POA: Diagnosis not present

## 2022-10-02 DIAGNOSIS — J309 Allergic rhinitis, unspecified: Secondary | ICD-10-CM

## 2022-10-02 DIAGNOSIS — J3089 Other allergic rhinitis: Secondary | ICD-10-CM

## 2022-10-02 LAB — COMPREHENSIVE METABOLIC PANEL
ALT: 19 U/L (ref 0–35)
AST: 20 U/L (ref 0–37)
Albumin: 4.5 g/dL (ref 3.5–5.2)
Alkaline Phosphatase: 126 U/L — ABNORMAL HIGH (ref 39–117)
BUN: 9 mg/dL (ref 6–23)
CO2: 28 mEq/L (ref 19–32)
Calcium: 9.6 mg/dL (ref 8.4–10.5)
Chloride: 104 mEq/L (ref 96–112)
Creatinine, Ser: 0.85 mg/dL (ref 0.40–1.20)
GFR: 74.95 mL/min (ref >60.00)
Glucose, Bld: 87 mg/dL (ref 70–99)
Potassium: 4.2 mEq/L (ref 3.5–5.1)
Sodium: 140 mEq/L (ref 135–145)
Total Bilirubin: 0.5 mg/dL (ref 0.2–1.2)
Total Protein: 7 g/dL (ref 6.0–8.3)

## 2022-10-02 LAB — LIPID PANEL
Cholesterol: 192 mg/dL (ref 0–200)
HDL: 76.8 mg/dL (ref >39.00)
LDL Cholesterol: 101 mg/dL — ABNORMAL HIGH (ref 0–99)
NonHDL: 115.17
Total CHOL/HDL Ratio: 2
Triglycerides: 69 mg/dL (ref 0.0–149.0)
VLDL: 13.8 mg/dL (ref 0.0–40.0)

## 2022-10-02 MED ORDER — LORATADINE 10 MG PO TABS: 10.0000 mg | ORAL_TABLET | Freq: Every day | ORAL | 11 refills | Status: DC

## 2022-10-02 MED ORDER — DOXYCYCLINE HYCLATE 100 MG PO TABS: 100.0000 mg | ORAL_TABLET | Freq: Two times a day (BID) | ORAL | 0 refills | Status: AC

## 2022-10-02 MED ORDER — PREDNISONE 20 MG PO TABS: ORAL_TABLET | ORAL | 0 refills | Status: DC

## 2022-10-02 NOTE — Assessment & Plan Note (Signed)
Repeat cmp today pending results. 

## 2022-10-02 NOTE — Patient Instructions (Signed)
  Recommend over the counter b12 500 mcg once daily.   Stop by the lab prior to leaving today. I will notify you of your results once received.    Regards,   Eugenia Pancoast FNP-C

## 2022-10-02 NOTE — Assessment & Plan Note (Signed)
Continue zetia 10 mg once daily  Ordered lipid panel, pending results. Work on low cholesterol diet and exercise as tolerated

## 2022-10-02 NOTE — Assessment & Plan Note (Signed)
Change to claritin 10 mg daily from zyrtec 10 mg nightly  Start flonase 50 mcg daily

## 2022-10-02 NOTE — Assessment & Plan Note (Signed)
RX doxycycline 100 mg po bid x 10 days Pt to continue tylenol/ibuprofen prn sinus pain. Continue with humidifier prn and steam showers recommended as well. instructed If no symptom improvement in 48 hours please f/u  

## 2022-10-02 NOTE — Progress Notes (Signed)
Established Patient Office Visit  Subjective:  Patient ID: Toni Mcdonald, female    DOB: September 06, 1961  Age: 61 y.o. MRN: [id number]  CC:  Chief Complaint  Patient presents with   Osteoporosis    HPI Toni Mcdonald is here today for follow up.   Pt is with acute concerns.  C/o congestion and sinus pressure especially on the left side.  Covid tested first day of symptoms.  Started symptoms Friday of last week.  Does c/o slight left ear pain.  No sore throat.   No fever or chills.  Dry non-productive cough with drainage at night also sneezing.   Past Medical History:  Diagnosis Date   Anxiety    Arthritis    Asthma    Phreesia 02/18/2021   Bronchitis    hx of   Chronic neck pain    GERD (gastroesophageal reflux disease)    Headache(784.0)    migraines   Hypertension    controlled by diet and exercise   Pneumonia    hx of    Past Surgical History:  Procedure Laterality Date   ABDOMINAL HYSTERECTOMY     Has both ovaries, still with cervix   ANTERIOR CERVICAL DECOMP/DISCECTOMY FUSION  09/22/2012   Procedure: ANTERIOR CERVICAL DECOMPRESSION/DISCECTOMY FUSION 3 LEVELS;  Surgeon: Eustace Moore, MD;  Location: [facility];  Service: Neurosurgery;  Laterality: N/A;  Anterior Cervical Diskectomy/Fusion with Plate, Cervical four through seven   CARDIOVASCULAR STRESS TEST     2008   COLONOSCOPY WITH PROPOFOL N/A 07/10/2016   Procedure: COLONOSCOPY WITH PROPOFOL;  Surgeon: Lucilla Lame, MD;  Location: [facility];  Service: Endoscopy;  Laterality: N/A;   DIAGNOSTIC LAPAROSCOPY     exploratory surgery due to "scarring"   TUBAL LIGATION      Family History  Problem Relation Age of Onset   Arthritis Mother    Heart disease Mother    Hyperlipidemia Mother    Hypertension Mother    Stroke Mother    Heart disease Father    Hyperlipidemia Father    Hypertension Father    Kidney disease Father    Stroke Father    Hyperthyroidism Sister     Hypothyroidism Sister    Thyroid disease Sister    Early death Brother    Heart disease Brother 56       Died at 64 with MI   Heart attack Brother    Vision loss Maternal Grandfather     Social History   Socioeconomic History   Marital status: Married    Spouse name: Not on file   Number of children: 2   Years of education: Not on file   Highest education level: Not on file  Occupational History   Not on file  Tobacco Use   Smoking status: Every Day    Packs/day: 1.00    Years: 35.00    Total pack years: 35.00    Types: Cigarettes   Smokeless tobacco: Never   Tobacco comments:    pack per day 02.24.20  Vaping Use   Vaping Use: Not on file  Substance and Sexual Activity   Alcohol use: Yes    Alcohol/week: 4.0 standard drinks of alcohol    Types: 4 Glasses of wine per week    Comment: occasional   Drug use: No   Sexual activity: Yes    Partners: Male    Birth control/protection: Post-menopausal  Other Topics Concern   Not on file  Social History Narrative  Married. 2 children/ two girls. 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 3 each 3   amLODipine (NORVASC) 5 MG tablet TAKE 1 TABLET (5 MG TOTAL) BY MOUTH DAILY. 90 tablet 1   budesonide (RHINOCORT AQUA) 32 MCG/ACT nasal spray EACH NARE 8.43 mL 0   Cholecalciferol (VITAMIN D) 50 MCG (2000 UT) CAPS Take 1 capsule (2,000 Units total) by mouth daily. 30 capsule     ezetimibe (ZETIA) 10 MG tablet TAKE 1 TABLET BY MOUTH EVERY DAY 90 tablet 3   Propylene Glycol 0.6 % SOLN Place 2 drops into both eyes daily.     cetirizine (ZYRTEC) 10 MG tablet TAKE 1 TABLET BY MOUTH EVERY DAY 90 tablet 3   vitamin B-12 (CYANOCOBALAMIN) 1000 MCG tablet Take 2,000 mcg by mouth daily. (Patient not taking: Reported on 10/02/2022)     No facility-administered medications prior to visit.    Allergies  Allergen Reactions   Penicillins Anaphylaxis    "whelps"   Erythromycin    Azithromycin Diarrhea    GI Upset    Clopidogrel Hives and Itching   Codeine Itching and Swelling    "skin crawls'   Neosporin [Neomycin-Bacitracin Zn-Polymyx]     "blisters the skin"   Percocet [Oxycodone-Acetaminophen]     Nausea and vomiting   Statins Other (See Comments)    Mental status changes, myalgias   Sulfamethoxazole-Trimethoprim    Wellbutrin [Bupropion] Hives         Objective:    Physical Exam Vitals reviewed.  Constitutional:      General: She is not in acute distress.    Appearance: Normal appearance. She is normal weight. She is not ill-appearing.  HENT:     Right Ear: Tympanic membrane normal.     Left Ear: Tympanic membrane normal.     Nose: No congestion or rhinorrhea.     Right Turbinates: Not enlarged or swollen.     Left Turbinates: Not enlarged or swollen.     Right Sinus: Maxillary sinus tenderness and frontal sinus tenderness present.     Left Sinus: Maxillary sinus tenderness and frontal sinus tenderness present.     Mouth/Throat:     Mouth: Mucous membranes are moist.     Pharynx: No pharyngeal swelling, oropharyngeal exudate or posterior oropharyngeal erythema.     Tonsils: No tonsillar exudate.  Eyes:     Extraocular Movements: Extraocular movements intact.     Conjunctiva/sclera: Conjunctivae normal.     Pupils: Pupils are equal, round, and reactive to light.  Neck:     Thyroid: No thyroid mass.  Cardiovascular:     Rate and Rhythm: Normal rate  and regular rhythm.  Pulmonary:     Effort: Pulmonary effort is normal.     Breath sounds: Normal breath sounds.  Lymphadenopathy:     Cervical:     Right cervical: No superficial cervical adenopathy.    Left cervical: No superficial cervical adenopathy.  Neurological:     Mental Status: She is alert.       BP 120/80   Pulse 87   Temp 98.2 F (36.8 C)   Resp 16   Ht 5' (1.524 m)   Wt 118 lb 6 oz (53.7 kg)   SpO2 97%   BMI 23.12 kg/m  Wt Readings from Last 3 Encounters:  10/02/22 118 lb 6 oz (53.7 kg)  04/02/22 110 lb 8 oz (50.1 kg)  03/17/22 111 lb 12.8 oz (50.7 kg)     Health Maintenance Due  Topic Date Due   MAMMOGRAM  07/19/2021   INFLUENZA VACCINE  Never done    There are no preventive care reminders to display for this patient.  Lab Results  Component Value Date   TSH 0.48 02/19/2021   Lab Results  Component Value Date   WBC 8.1 04/02/2022   HGB 14.4 04/02/2022   HCT 44.2 04/02/2022   MCV 87.6 04/02/2022   PLT 376.0 04/02/2022   Lab Results  Component Value Date   NA 139 04/25/2022   K 4.3 04/25/2022   CO2 28 04/25/2022   GLUCOSE 91 04/25/2022   BUN 11 04/25/2022   CREATININE 0.80 04/25/2022   BILITOT 0.5 04/25/2022   ALKPHOS 117 04/25/2022   AST 20 04/25/2022   ALT 18 04/25/2022   PROT 6.5 04/25/2022   ALBUMIN 4.3 04/25/2022   CALCIUM 9.1 04/25/2022   ANIONGAP 6 02/18/2016   GFR 80.85 04/25/2022   Lab Results  Component Value Date   CHOL 187 04/02/2022   Lab Results  Component Value Date   HDL 78.10 04/02/2022   Lab Results  Component Value Date   LDLCALC 92 04/02/2022   Lab Results  Component Value Date   TRIG 83.0 04/02/2022   Lab Results  Component Value Date   CHOLHDL 2 04/02/2022   No results found for: "HGBA1C"    Assessment & Plan:   Problem List Items Addressed This Visit       Respiratory   Perennial allergic rhinitis    Change to claritin 10 mg daily from zyrtec 10 mg nightly  Start flonase 50 mcg  daily        Allergic rhinitis   Relevant Medications   loratadine (CLARITIN) 10 MG tablet   Acute non-recurrent frontal sinusitis - Primary    RX doxycycline 100 mg po bid x 10 days Pt to continue tylenol/ibuprofen prn sinus pain. Continue with humidifier prn and steam showers recommended as well. instructed If no symptom improvement in 48 hours please f/u       Relevant Medications   predniSONE (DELTASONE) 20 MG tablet   doxycycline (VIBRA-TABS) 100 MG tablet   loratadine (CLARITIN) 10 MG tablet     Other   Mixed hyperlipidemia    Continue zetia 10 mg once daily  Ordered lipid panel, pending results. Work on low cholesterol diet and exercise as tolerated       Relevant Orders   Lipid panel   Vitamin B12 deficiency    Recommend b12 500 mcg once daily       Elevated liver function tests    Repeat cmp today pending results       Relevant Orders   Comprehensive metabolic panel   Other Visit Diagnoses     Suspected COVID-19 virus infection       Relevant Orders   POC COVID-19 BinaxNow       Meds ordered this encounter  Medications   predniSONE (DELTASONE)  20 MG tablet    Sig: Take two tablets once daily for five days    Dispense:  10 tablet    Refill:  0    Order Specific Question:   Supervising Provider    Answer:   BEDSOLE, AMY E [[id number]]   doxycycline (VIBRA-TABS) 100 MG tablet    Sig: Take 1 tablet (100 mg total) by mouth 2 (two) times daily for 10 days.    Dispense:  20 tablet    Refill:  0    Order Specific Question:   Supervising Provider    Answer:   BEDSOLE, AMY E [[id number]]   loratadine (CLARITIN) 10 MG tablet    Sig: Take 1 tablet (10 mg total) by mouth daily.    Dispense:  30 tablet    Refill:  11    Order Specific Question:   Supervising Provider    Answer:   Diona Browner, AMY E [[id number]]    Follow-up: No follow-ups on file.    Eugenia Pancoast, FNP

## 2022-10-02 NOTE — Assessment & Plan Note (Signed)
Recommend b12 500 mcg once daily

## 2022-10-03 LAB — POC COVID19 BINAXNOW: SARS Coronavirus 2 Ag: NEGATIVE

## 2022-10-14 ENCOUNTER — Telehealth: Payer: Self-pay | Admitting: Family

## 2022-10-14 ENCOUNTER — Encounter: Payer: Self-pay | Admitting: *Deleted

## 2022-10-14 DIAGNOSIS — R748 Abnormal levels of other serum enzymes: Secondary | ICD-10-CM

## 2022-10-14 NOTE — Telephone Encounter (Signed)
Pt called in stated getting a ultra sound done was discuss with PCP and would like to have it done at a [place] location . Please Advise [phone]

## 2022-10-14 NOTE — Telephone Encounter (Signed)
Responded via [employer].  Claiborne Billings was for abdominal u/s response from patient due to question asked in labs.

## 2022-10-14 NOTE — Telephone Encounter (Signed)
Called and informed pt of the information, and also advised that it was sent in a [place] message as well

## 2022-10-22 ENCOUNTER — Ambulatory Visit
Admission: RE | Admit: 2022-10-22 | Discharge: 2022-10-22 | Disposition: A | Discharge status: Home / Self Care | Payer: PRIVATE HEALTH INSURANCE | Source: Ambulatory Visit | Attending: Family | Admitting: Family

## 2022-10-22 DIAGNOSIS — R748 Abnormal levels of other serum enzymes: Secondary | ICD-10-CM | POA: Insufficient documentation

## 2022-10-27 ENCOUNTER — Encounter (INDEPENDENT_AMBULATORY_CARE_PROVIDER_SITE_OTHER): Payer: Self-pay

## 2023-01-06 ENCOUNTER — Other Ambulatory Visit: Payer: Self-pay | Admitting: Family

## 2023-01-06 DIAGNOSIS — I10 Essential (primary) hypertension: Secondary | ICD-10-CM

## 2023-03-12 ENCOUNTER — Telehealth: Payer: Self-pay | Admitting: Family

## 2023-03-12 NOTE — Telephone Encounter (Signed)
error 

## 2023-03-13 NOTE — Progress Notes (Unsigned)
MRN : [id number]  Toni Mcdonald is a 62 y.o. (Feb 24, 1961) female who presents with chief complaint of check carotid arteries.  History of Present Illness:   The patient is seen for evaluation of carotid stenosis. The carotid stenosis was identified remotely but she has not had recent follow up.   The patient denies amaurosis fugax. There is no recent history of TIA symptoms or focal motor deficits. There is no prior documented CVA.   There is no history of migraine headaches. There is no history of seizures.   The patient is taking enteric-coated aspirin 81 mg daily.   The patient has a history of coronary artery disease, no recent episodes of angina or shortness of breath. The patient denies PAD or claudication symptoms. There is a history of hyperlipidemia which is being treated with a statin.    Duplex ultrasound of the carotid arteries obtained today demonstrates [id number] and LICA 0000000.  No significant change when compared to the previous study   No outpatient medications have been marked as taking for the 03/16/23 encounter (Appointment) with Delana Meyer, Dolores Lory, MD.    Past Medical History:  Diagnosis Date   Anxiety    Arthritis    Asthma    Phreesia 02/18/2021   Bronchitis    hx of   Chronic neck pain    GERD (gastroesophageal reflux disease)    Headache(784.0)    migraines   Hypertension    controlled by diet and exercise   Pneumonia    hx of    Past Surgical History:  Procedure Laterality Date   ABDOMINAL HYSTERECTOMY     Has both ovaries, still with cervix   ANTERIOR CERVICAL DECOMP/DISCECTOMY FUSION  09/22/2012   Procedure: ANTERIOR CERVICAL DECOMPRESSION/DISCECTOMY FUSION 3 LEVELS;  Surgeon: Eustace Moore, MD;  Location: [facility];  Service: Neurosurgery;  Laterality: N/A;  Anterior Cervical Diskectomy/Fusion with Plate, Cervical four through seven   CARDIOVASCULAR STRESS TEST     2008   COLONOSCOPY WITH PROPOFOL N/A 07/10/2016    Procedure: COLONOSCOPY WITH PROPOFOL;  Surgeon: Lucilla Lame, MD;  Location: [facility];  Service: Endoscopy;  Laterality: N/A;   DIAGNOSTIC LAPAROSCOPY     exploratory surgery due to "scarring"   TUBAL LIGATION      Social History Social History   Tobacco Use   Smoking status: Every Day    Packs/day: 1.00    Years: 35.00    Additional pack years: 0.00    Total pack years: 35.00    Types: Cigarettes   Smokeless tobacco: Never   Tobacco comments:    pack per day 02.24.20  Substance Use Topics   Alcohol use: Yes    Alcohol/week: 4.0 standard drinks of alcohol    Types: 4 Glasses of wine per week    Comment: occasional   Drug use: No    Family History Family History  Problem Relation Age of Onset   Arthritis Mother    Heart disease Mother    Hyperlipidemia Mother    Hypertension Mother    Stroke Mother    Heart disease Father    Hyperlipidemia Father    Hypertension Father    Kidney disease Father    Stroke Father    Hyperthyroidism Sister    Hypothyroidism Sister    Thyroid disease Sister    Early death Brother    Heart disease Brother 77       Died at 16 with MI   Heart  attack Brother    Vision loss Maternal Grandfather     Allergies  Allergen Reactions   Penicillins Anaphylaxis    "whelps"   Erythromycin    Azithromycin Diarrhea    GI Upset    Clopidogrel Hives and Itching   Codeine Itching and Swelling    "skin crawls'   Neosporin [Neomycin-Bacitracin Zn-Polymyx]     "blisters the skin"   Percocet [Oxycodone-Acetaminophen]     Nausea and vomiting   Statins Other (See Comments)    Mental status changes, myalgias   Sulfamethoxazole-Trimethoprim    Wellbutrin [Bupropion] Hives     REVIEW OF SYSTEMS (Negative unless checked)  Constitutional: [] Weight loss  [] Fever  [] Chills Cardiac: [] Chest pain   [] Chest pressure   [] Palpitations   [] Shortness of breath when laying flat   [] Shortness of breath with exertion. Vascular:  [x] Pain in legs  with walking   [] Pain in legs at rest  [] History of DVT   [] Phlebitis   [] Swelling in legs   [] Varicose veins   [] Non-healing ulcers Pulmonary:   [] Uses home oxygen   [] Productive cough   [] Hemoptysis   [] Wheeze  [] COPD   [] Asthma Neurologic:  [] Dizziness   [] Seizures   [] History of stroke   [] History of TIA  [] Aphasia   [] Vissual changes   [] Weakness or numbness in arm   [] Weakness or numbness in leg Musculoskeletal:   [] Joint swelling   [] Joint pain   [] Low back pain Hematologic:  [] Easy bruising  [] Easy bleeding   [] Hypercoagulable state   [] Anemic Gastrointestinal:  [] Diarrhea   [] Vomiting  [] Gastroesophageal reflux/heartburn   [] Difficulty swallowing. Genitourinary:  [] Chronic kidney disease   [] Difficult urination  [] Frequent urination   [] Blood in urine Skin:  [] Rashes   [] Ulcers  Psychological:  [] History of anxiety   []  History of major depression.  Physical Examination  There were no vitals filed for this visit. There is no height or weight on file to calculate BMI. Gen: WD/WN, NAD Head: [place]/AT, No temporalis wasting.  Ear/Nose/Throat: Hearing grossly intact, nares w/o erythema or drainage Eyes: PER, EOMI, sclera nonicteric.  Neck: Supple, no masses.  No bruit or JVD.  Pulmonary:  Good air movement, no audible wheezing, no use of accessory muscles.  Cardiac: RRR, normal S1, S2, no Murmurs. Vascular:  carotid bruit noted Vessel Right Left  Radial Palpable Palpable  Carotid  Palpable  Palpable  Subclav  Palpable Palpable  Gastrointestinal: soft, non-distended. No guarding/no peritoneal signs.  Musculoskeletal: M/S 5/5 throughout.  No visible deformity.  Neurologic: CN 2-12 intact. Pain and light touch intact in extremities.  Symmetrical.  Speech is fluent. Motor exam as listed above. Psychiatric: Judgment intact, Mood & affect appropriate for pt's clinical situation. Dermatologic: No rashes or ulcers noted.  No changes consistent with cellulitis.   CBC Lab Results  Component  Value Date   WBC 8.1 04/02/2022   HGB 14.4 04/02/2022   HCT 44.2 04/02/2022   MCV 87.6 04/02/2022   PLT 376.0 04/02/2022    BMET    Component Value Date/Time   NA 140 10/02/2022 0933   K 4.2 10/02/2022 0933   CL 104 10/02/2022 0933   CO2 28 10/02/2022 0933   GLUCOSE 87 10/02/2022 0933   BUN 9 10/02/2022 0933   CREATININE 0.85 10/02/2022 0933   CREATININE 0.76 02/19/2021 0835   CALCIUM 9.6 10/02/2022 0933   GFRNONAA 87 02/19/2021 0835   GFRAA 101 02/19/2021 0835   CrCl cannot be calculated (Patient's most recent lab result is  older than the maximum 21 days allowed.).  COAG Lab Results  Component Value Date   INR 1.0 06/17/2016   INR 0.92 09/15/2012    Radiology No results found.   Assessment/Plan There are no diagnoses linked to this encounter.   Hortencia Pilar, MD  03/13/2023 7:54 AM

## 2023-03-16 ENCOUNTER — Ambulatory Visit (INDEPENDENT_AMBULATORY_CARE_PROVIDER_SITE_OTHER): Payer: PRIVATE HEALTH INSURANCE | Admitting: Vascular Surgery

## 2023-03-16 ENCOUNTER — Encounter (INDEPENDENT_AMBULATORY_CARE_PROVIDER_SITE_OTHER): Payer: Self-pay | Admitting: Vascular Surgery

## 2023-03-16 ENCOUNTER — Ambulatory Visit (INDEPENDENT_AMBULATORY_CARE_PROVIDER_SITE_OTHER): Payer: PRIVATE HEALTH INSURANCE

## 2023-03-16 VITALS — BP 128/90 | HR 89 | Resp 18 | Ht 60.0 in | Wt 123.4 lb

## 2023-03-16 DIAGNOSIS — I10 Essential (primary) hypertension: Secondary | ICD-10-CM

## 2023-03-16 DIAGNOSIS — J4489 Other specified chronic obstructive pulmonary disease: Secondary | ICD-10-CM

## 2023-03-16 DIAGNOSIS — E782 Mixed hyperlipidemia: Secondary | ICD-10-CM

## 2023-03-16 DIAGNOSIS — I6523 Occlusion and stenosis of bilateral carotid arteries: Secondary | ICD-10-CM

## 2023-03-25 ENCOUNTER — Other Ambulatory Visit: Payer: Self-pay

## 2023-03-25 DIAGNOSIS — E7800 Pure hypercholesterolemia, unspecified: Secondary | ICD-10-CM

## 2023-03-25 NOTE — Telephone Encounter (Signed)
Patient called office refill request was being sent to another office. Ok to refill she has been out for 3 days. Would like sent to [facility] [address]

## 2023-03-26 MED ORDER — EZETIMIBE 10 MG PO TABS: 10.0000 mg | ORAL_TABLET | Freq: Every day | ORAL | 3 refills | Status: DC

## 2023-04-10 ENCOUNTER — Encounter: Payer: Self-pay | Admitting: Family

## 2023-04-10 ENCOUNTER — Ambulatory Visit (INDEPENDENT_AMBULATORY_CARE_PROVIDER_SITE_OTHER): Payer: PRIVATE HEALTH INSURANCE | Admitting: Family

## 2023-04-10 VITALS — BP 122/82 | HR 84 | Temp 97.8°F | Ht 60.0 in | Wt 123.6 lb

## 2023-04-10 DIAGNOSIS — R7989 Other specified abnormal findings of blood chemistry: Secondary | ICD-10-CM

## 2023-04-10 DIAGNOSIS — Z1231 Encounter for screening mammogram for malignant neoplasm of breast: Secondary | ICD-10-CM | POA: Diagnosis not present

## 2023-04-10 DIAGNOSIS — E782 Mixed hyperlipidemia: Secondary | ICD-10-CM

## 2023-04-10 DIAGNOSIS — J4489 Other specified chronic obstructive pulmonary disease: Secondary | ICD-10-CM | POA: Diagnosis not present

## 2023-04-10 DIAGNOSIS — I10 Essential (primary) hypertension: Secondary | ICD-10-CM

## 2023-04-10 DIAGNOSIS — Z0001 Encounter for general adult medical examination with abnormal findings: Secondary | ICD-10-CM | POA: Diagnosis not present

## 2023-04-10 DIAGNOSIS — F17210 Nicotine dependence, cigarettes, uncomplicated: Secondary | ICD-10-CM

## 2023-04-10 DIAGNOSIS — Z780 Asymptomatic menopausal state: Secondary | ICD-10-CM | POA: Insufficient documentation

## 2023-04-10 LAB — COMPREHENSIVE METABOLIC PANEL
ALT: 18 U/L (ref 0–35)
AST: 19 U/L (ref 0–37)
Albumin: 4.5 g/dL (ref 3.5–5.2)
Alkaline Phosphatase: 122 U/L — ABNORMAL HIGH (ref 39–117)
BUN: 8 mg/dL (ref 6–23)
CO2: 27 mEq/L (ref 19–32)
Calcium: 9.4 mg/dL (ref 8.4–10.5)
Chloride: 105 mEq/L (ref 96–112)
Creatinine, Ser: 0.82 mg/dL (ref 0.40–1.20)
GFR: 77.97 mL/min (ref >60.00)
Glucose, Bld: 99 mg/dL (ref 70–99)
Potassium: 4.6 mEq/L (ref 3.5–5.1)
Sodium: 141 mEq/L (ref 135–145)
Total Bilirubin: 0.4 mg/dL (ref 0.2–1.2)
Total Protein: 6.8 g/dL (ref 6.0–8.3)

## 2023-04-10 LAB — CBC
HCT: 42.3 % (ref 36.0–46.0)
Hemoglobin: 14.1 g/dL (ref 12.0–15.0)
MCHC: 33.3 g/dL (ref 30.0–36.0)
MCV: 87.1 fl (ref 78.0–100.0)
Platelets: 382 10*3/uL (ref 150.0–400.0)
RBC: 4.86 Mil/uL (ref 3.87–5.11)
RDW: 14.2 % (ref 11.5–15.5)
WBC: 7.8 10*3/uL (ref 4.0–10.5)

## 2023-04-10 LAB — LIPID PANEL
Cholesterol: 183 mg/dL (ref 0–200)
HDL: 72.7 mg/dL (ref >39.00)
LDL Cholesterol: 95 mg/dL (ref 0–99)
NonHDL: 110.79
Total CHOL/HDL Ratio: 3
Triglycerides: 79 mg/dL (ref 0.0–149.0)
VLDL: 15.8 mg/dL (ref 0.0–40.0)

## 2023-04-10 LAB — MICROALBUMIN / CREATININE URINE RATIO
Creatinine,U: 21.1 mg/dL
Microalb Creat Ratio: 3.3 mg/g (ref 0.0–30.0)
Microalb, Ur: <0.7 mg/dL (ref 0.0–1.9)

## 2023-04-10 MED ORDER — AMLODIPINE BESYLATE 5 MG PO TABS
5.0000 mg | ORAL_TABLET | Freq: Every day | ORAL | 3 refills | Status: AC
Start: 2023-04-10 — End: ?

## 2023-04-10 MED ORDER — ALBUTEROL SULFATE HFA 108 (90 BASE) MCG/ACT IN AERS
1.0000 | INHALATION_SPRAY | Freq: Four times a day (QID) | RESPIRATORY_TRACT | 3 refills | Status: DC | PRN
Start: 2023-04-10 — End: 2024-08-30

## 2023-04-10 NOTE — Progress Notes (Signed)
Subjective:  Patient ID: Toni Mcdonald, female    DOB: 25-Oct-1961  Age: 62 y.o. MRN: [id number]  Patient Care Team: Mort Sawyers, FNP as PCP - General (Family Medicine)   CC:  Chief Complaint  Patient presents with   Annual Exam    HPI Toni Mcdonald is a 62 y.o. female who presents today for an annual physical exam. She reports consuming a general diet. The patient does not participate in regular exercise at present. She generally feels well. She reports sleeping fairly well. She does not have additional problems to discuss today.   Vision:Within last year Dental:Receives regular dental care STD:The patient denies history of sexually transmitted disease.  Lung Cancer Screening with low-dose Chest CT: n/a - Adults age 13-80 who are current cigarette smokers or quit within the last 15 years. Must have 20 pack year history.  AAA Screening: n/a - Men age 40-75 who have ever smoked  Mammogram: 07/20/2019 Last pap: 03/05/2021 has had h/o leep, biopsy and d&c, did have positive ASCUS 07/25/2019 Colonoscopy: 07/10/2016 Bone density scan: 08/27/2015  Pt is without acute concerns.   Allergic rhinitis: uses saline mist regularly and also flonase daily.   HLD: on zetia daily doing well.   Tobacco abuse: still smoking, no desire to quit.   COPD with asthma: albuterol inhaler only uses a few times or month when allergies act up. Otherwise not needed.   Lipid Panel     Component Value Date/Time   CHOL 192 10/02/2022 0933   TRIG 69.0 10/02/2022 0933   HDL 76.80 10/02/2022 0933   CHOLHDL 2 10/02/2022 0933   VLDL 13.8 10/02/2022 0933   LDLCALC 101 (H) 10/02/2022 0933   LDLCALC 118 (H) 02/19/2021 0835    Advanced Directives Patient does not have advanced directives including  n/a . She does not have a copy in the electronic medical record.   DEPRESSION SCREENING    04/10/2023    7:24 AM 04/02/2022    8:24 AM 07/25/2019    8:08 AM 07/21/2018    3:15 PM 08/22/2016   10:48  AM 08/15/2015    8:09 AM 07/27/2014    8:08 AM  PHQ 2/9 Scores  PHQ - 2 Score 1 0 0 0 0 0 2  PHQ- 9 Score  0   0  7     ROS: Negative unless specifically indicated above in HPI.    Current Outpatient Medications:    albuterol (VENTOLIN HFA) 108 (90 Base) MCG/ACT inhaler, Inhale 1-2 puffs into the lungs every 6 (six) hours as needed for wheezing or shortness of breath., Disp: 3 each, Rfl: 3   amLODipine (NORVASC) 5 MG tablet, Take 1 tablet (5 mg total) by mouth daily., Disp: 90 tablet, Rfl: 3   budesonide (RHINOCORT AQUA) 32 MCG/ACT nasal spray, EACH NARE, Disp: 8.43 mL, Rfl: 0   Cholecalciferol (VITAMIN D) 50 MCG (2000 UT) CAPS, Take 1 capsule (2,000 Units total) by mouth daily., Disp: 30 capsule, Rfl:    ezetimibe (ZETIA) 10 MG tablet, Take 1 tablet (10 mg total) by mouth daily., Disp: 90 tablet, Rfl: 3   loratadine (CLARITIN) 10 MG tablet, Take 1 tablet (10 mg total) by mouth daily., Disp: 30 tablet, Rfl: 11   Propylene Glycol 0.6 % SOLN, Place 2 drops into both eyes daily., Disp: , Rfl:     Objective:    BP 122/82   Pulse 84   Temp 97.8 F (36.6 C) (Temporal)   Ht 5' (1.524 m)  Wt 123 lb 9.6 oz (56.1 kg)   SpO2 98%   BMI 24.14 kg/m   BP Readings from Last 3 Encounters:  04/10/23 122/82  03/16/23 (!) 128/90  10/02/22 120/80      Physical Exam Constitutional:      General: She is not in acute distress.    Appearance: Normal appearance. She is normal weight. She is not ill-appearing.  HENT:     Head: Normocephalic.     Right Ear: Tympanic membrane normal.     Left Ear: Tympanic membrane normal.     Nose: Nose normal.     Mouth/Throat:     Mouth: Mucous membranes are moist.  Eyes:     Extraocular Movements: Extraocular movements intact.     Pupils: Pupils are equal, round, and reactive to light.  Cardiovascular:     Rate and Rhythm: Normal rate and regular rhythm.  Pulmonary:     Effort: Pulmonary effort is normal.     Breath sounds: Normal breath sounds.   Abdominal:     General: Abdomen is flat. Bowel sounds are normal.     Palpations: Abdomen is soft.     Tenderness: There is no guarding or rebound.  Musculoskeletal:        General: Normal range of motion.     Cervical back: Normal range of motion.  Skin:    General: Skin is warm.     Capillary Refill: Capillary refill takes less than 2 seconds.  Neurological:     General: No focal deficit present.     Mental Status: She is alert.  Psychiatric:        Mood and Affect: Mood normal.        Behavior: Behavior normal.        Thought Content: Thought content normal.        Judgment: Judgment normal.          Assessment & Plan:  Screening mammogram for breast cancer -     3D Screening Mammogram, Left and Right; Future  COPD with asthma Assessment & Plan: Controlled  Albuterol prn  Did refer pt back to lung cancer screening   Orders: -     Albuterol Sulfate HFA; Inhale 1-2 puffs into the lungs every 6 (six) hours as needed for wheezing or shortness of breath.  Dispense: 3 each; Refill: 3 -     Ambulatory Referral for Lung Cancer Scre  Essential hypertension, benign -     amLODIPine Besylate; Take 1 tablet (5 mg total) by mouth daily.  Dispense: 90 tablet; Refill: 3 -     Microalbumin / creatinine urine ratio  Postmenopausal -     DG Bone Density; Future  Tobacco dependence due to cigarettes Assessment & Plan: Referral to lung cancer screening program made.   Orders: -     Ambulatory Referral for Lung Cancer Scre -     CBC  Mixed hyperlipidemia Assessment & Plan: Ordered lipid panel, pending results. Work on low cholesterol diet and exercise as tolerated Will consider repatha pending labs as statin intolerance  Orders: -     Lipid panel  Elevated liver function tests Assessment & Plan: Ordering LFTs pending results  Orders: -     Comprehensive metabolic panel  Encounter for general adult medical examination with abnormal findings Assessment &  Plan: Patient Counseling(The following topics were reviewed):  Preventative care handout given to pt  Health maintenance and immunizations reviewed. Please refer to Health maintenance section. Pt advised on safe  sex, wearing seatbelts in car, and proper nutrition labwork ordered today for annual Dental health: Discussed importance of regular tooth brushing, flossing, and dental visits.   Orders: -     Microalbumin / creatinine urine ratio -     Lipid panel -     Comprehensive metabolic panel -     CBC      Follow-up: Return in about 1 year (around 04/09/2024) for f/u CPE.   Mort Sawyers, FNP

## 2023-04-10 NOTE — Patient Instructions (Signed)
  I have sent an electronic order over to your preferred location for the following:   []   2D Mammogram  [x]   3D Mammogram  [x]   Bone Density   Please give this center a call to get scheduled at your convenience.  [x]   [facility]  [address] [place] [address]  [phone]  Make sure to wear two piece  clothing  No lotions powders or deodorants the day of the appointment Make sure to bring picture ID and insurance card.  Bring list of medications you are currently taking including any supplements.    ------------------------------------ A referral was placed today for lung screening Please let us know if you have not heard back within 2 weeks about the referral.   Stop by the lab prior to leaving today. I will notify you of your results once received.   Recommendations on keeping yourself healthy:  - Exercise at least 30-45 minutes a day, 3-4 days a week.  - Eat a low-fat diet with lots of fruits and vegetables, up to 7-9 servings per day.  - Seatbelts can save your life. Wear them always.  - Smoke detectors on every level of your home, check batteries every year.  - Eye Doctor - have an eye exam every 1-2 years  - Safe sex - if you may be exposed to STDs, use a condom.  - Alcohol -  If you drink, do it moderately, less than 2 drinks per day.  - Health Care Power of Attorney. Choose someone to speak for you if you are not able.  - Depression is common in our stressful world.If you're feeling down or losing interest in things you normally enjoy, please come in for a visit.  - Violence - If anyone is threatening or hurting you, please call immediately.  Due to recent changes in healthcare laws, you may see results of your imaging and/or laboratory studies on MyChart before I have had a chance to review them.  I understand that in some cases there may be results that are confusing or concerning to you. Please  understand that not all results are received at the same time and often I may need to interpret multiple results in order to provide you with the best plan of care or course of treatment. Therefore, I ask that you please give me 2 business days to thoroughly review all your results before contacting my office for clarification. Should we see a critical lab result, you will be contacted sooner.   I will see you again in one year for your annual comprehensive exam unless otherwise stated and or with acute concerns.  It was a pleasure seeing you today! Please do not hesitate to reach out with any questions and or concerns.  Regards,   Mort Sawyers

## 2023-04-10 NOTE — Assessment & Plan Note (Signed)
Patient Counseling(The following topics were reviewed): ? Preventative care handout given to pt  ?Health maintenance and immunizations reviewed. Please refer to Health maintenance section. ?Pt advised on safe sex, wearing seatbelts in car, and proper nutrition ?labwork ordered today for annual ?Dental health: Discussed importance of regular tooth brushing, flossing, and dental visits. ? ? ?

## 2023-04-10 NOTE — Assessment & Plan Note (Signed)
Ordering LFTs pending results

## 2023-04-10 NOTE — Assessment & Plan Note (Signed)
Ordered lipid panel, pending results. Work on low cholesterol diet and exercise as tolerated Will consider repatha pending labs as statin intolerance

## 2023-04-10 NOTE — Assessment & Plan Note (Signed)
Controlled  Albuterol prn  Did refer pt back to lung cancer screening

## 2023-04-10 NOTE — Assessment & Plan Note (Signed)
Referral to lung cancer screening program made.  

## 2023-05-04 ENCOUNTER — Telehealth: Payer: Self-pay | Admitting: Family

## 2023-05-04 NOTE — Telephone Encounter (Signed)
Prescription Request  05/04/2023  LOV: 04/10/2023  What is the name of the medication or equipment? BREO ELLIPTA 100-25 MCG/INH AEPB   Have you contacted your pharmacy to request a refill? No   Which pharmacy would you like this sent to?  [facility] [address], [place] - [address] [place] [address] Phone: [phone] Fax: [phone]    Patient notified that their request is being sent to the clinical staff for review and that they should receive a response within 2 business days.   Please advise at Mobile [phone] (mobile)

## 2023-05-04 NOTE — Telephone Encounter (Signed)
Request for refill: BREO ELLIPTA 100-25 MCG/INH AEPB   LOV 04/10/23

## 2023-06-11 ENCOUNTER — Ambulatory Visit
Admission: RE | Admit: 2023-06-11 | Discharge: 2023-06-11 | Disposition: A | Discharge status: Home / Self Care | Payer: No Typology Code available for payment source | Source: Ambulatory Visit | Attending: Family | Admitting: Family

## 2023-06-11 DIAGNOSIS — Z1231 Encounter for screening mammogram for malignant neoplasm of breast: Secondary | ICD-10-CM | POA: Diagnosis not present

## 2023-06-12 ENCOUNTER — Other Ambulatory Visit: Payer: Self-pay | Admitting: *Deleted

## 2023-06-12 ENCOUNTER — Inpatient Hospital Stay
Admission: RE | Admit: 2023-06-12 | Discharge: 2023-06-12 | Disposition: A | Discharge status: Home / Self Care | Payer: Self-pay | Source: Ambulatory Visit | Attending: Family | Admitting: Family

## 2023-06-12 DIAGNOSIS — Z1231 Encounter for screening mammogram for malignant neoplasm of breast: Secondary | ICD-10-CM

## 2023-08-04 ENCOUNTER — Encounter: Payer: Self-pay | Admitting: *Deleted

## 2023-08-13 ENCOUNTER — Ambulatory Visit: Payer: No Typology Code available for payment source

## 2023-08-13 ENCOUNTER — Ambulatory Visit
Admission: EM | Admit: 2023-08-13 | Discharge: 2023-08-13 | Disposition: A | Discharge status: Home / Self Care | Payer: No Typology Code available for payment source | Attending: Family Medicine | Admitting: Family Medicine

## 2023-08-13 DIAGNOSIS — R0781 Pleurodynia: Secondary | ICD-10-CM

## 2023-08-13 MED ORDER — TIZANIDINE HCL 4 MG PO CAPS: 4.0000 mg | ORAL_CAPSULE | Freq: Three times a day (TID) | ORAL | 0 refills | Status: DC | PRN

## 2023-08-13 NOTE — ED Triage Notes (Signed)
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 Pt reports muscles spasms. Since last night after second injury pt declines filing workers comp.

## 2023-08-13 NOTE — ED Provider Notes (Signed)
[facility]    CSN: [id number] Arrival date & time: 08/13/23  1015      History   Chief Complaint No chief complaint on file.   HPI Toni Mcdonald is a 62 y.o. female.   Patient presenting today with left anterior and lateral rib pain after pulling a heavy object 4 days ago and again last night.  States the pain is significantly worse with movement and there is some swelling to this area.  Denies bruising, chest pain, shortness of breath, nausea vomiting or diarrhea.  Trying ibuprofen with minimal relief.    Past Medical History:  Diagnosis Date   Anxiety    Arthritis    Asthma    [employer]rees[employer] 02/18/2021   Bronchitis    hx of   Chronic neck pain    GERD (gastroesophageal reflux disease)    Headache(784.0)    migraines   Hypertension    controlled by diet and exercise   Pneumonia    hx of    Patient Active Problem List   Diagnosis Date Noted   Encounter for general adult medical examination with abnormal findings 04/10/2023   Postmenopausal 04/10/2023   Allergic rhinitis 10/02/2022   Elevated liver function tests 04/07/2022   Vitiligo 04/02/2022   Mixed hyperlipidemia 04/02/2022   Localized osteoporosis without current pathological fracture 04/02/2022   Vitamin B12 deficiency 04/02/2022   Internal hemorrhoid 04/02/2022   Statin myopathy 04/02/2022   Statin intolerance 04/02/2022   Carotid arterial disease (HCC) 07/25/2019   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   Perennial allergic rhinitis 01/31/2019   Laryngopharyngeal reflux (LPR) 01/31/2019   Tobacco dependence due to cigarettes 01/31/2019   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   Essential hypertension, benign 07/27/2014   Vitamin D deficiency 07/27/2014    Past Surgical History:  Procedure Laterality Date   ANTERIOR CERVICAL DECOMP/DISCECTOMY FUSION  09/22/2012   Procedure: ANTERIOR CERVICAL DECOMPRESSION/DISCECTOMY FUSION 3 LEVELS;  Surgeon: Tia Alert, MD;  Location: [facility];  Service:  Neurosurgery;  Laterality: N/A;  Anterior Cervical Diskectomy/Fusion with Plate, Cervical four through seven   CARDIOVASCULAR STRESS TEST     2008   COLONOSCOPY WITH PROPOFOL N/A 07/10/2016   Procedure: COLONOSCOPY WITH PROPOFOL;  Surgeon: Midge Minium, MD;  Location: [facility];  Service: Endoscopy;  Laterality: N/A;   DIAGNOSTIC LAPAROSCOPY     exploratory surgery due to "scarring"   TOTAL ABDOMINAL HYSTERECTOMY     still with ovaries   TUBAL LIGATION      OB History   No obstetric history on file.      Home Medications    Prior to Admission medications   Medication Sig Start Date End Date Taking? Authorizing Provider  tiZANidine (ZANAFLEX) 4 MG capsule Take 1 capsule (4 mg total) by mouth 3 (three) times daily as needed for muscle spasms. do not drink alcohol or drive while taking this medication.  May cause drowsiness. 08/13/23  Yes Particia Nearing, PA-C  albuterol (VENTOLIN HFA) 108 (90 Base) MCG/ACT inhaler Inhale 1-2 puffs into the lungs every 6 (six) hours as needed for wheezing or shortness of breath. 04/10/23   Mort Sawyers, FNP  amLODipine (NORVASC) 5 MG tablet Take 1 tablet (5 mg total) by mouth daily. 04/10/23   Mort Sawyers, FNP  budesonide (RHINOCORT AQUA) 32 MCG/ACT nasal spray EACH NARE 05/17/20   Lawson Fiscal A, FNP  Cholecalciferol (VITAMIN D) 50 MCG (2000 UT) CAPS Take 1 capsule (2,000 Units total) by mouth daily. 04/02/22   Dugal,  Tabitha, FNP  ezetimibe (ZETIA) 10 MG tablet Take 1 tablet (10 mg total) by mouth daily. 03/26/23   Mort Sawyers, FNP  loratadine (CLARITIN) 10 MG tablet Take 1 tablet (10 mg total) by mouth daily. 10/02/22   Mort Sawyers, FNP  Propylene Glycol 0.6 % SOLN Place 2 drops into both eyes daily.    [provider]    Family History Family History  Problem Relation Age of Onset   Arthritis Mother    Heart disease Mother    Hyperlipidemia Mother    Hypertension Mother    Stroke Mother    Heart disease Father     Hyperlipidemia Father    Hypertension Father    Kidney disease Father    Stroke Father    Hyperthyroidism Sister    Hypothyroidism Sister    Thyroid disease Sister    Early death Brother    Heart disease Brother 9       Died at 41 with MI   Heart attack Brother    Vision loss Maternal Grandfather     Social History Social History   Tobacco Use   Smoking status: Every Day    Current packs/day: 1.00    Average packs/day: 1 pack/day for 35.0 years (35.0 ttl pk-yrs)    Types: Cigarettes   Smokeless tobacco: Never   Tobacco comments:    pack per day 02.24.20  Substance Use Topics   Alcohol use: Yes    Alcohol/week: 4.0 standard drinks of alcohol    Types: 4 Glasses of wine per week    Comment: occasional   Drug use: No     Allergies   Penicillins, Erythromycin, Atorvastatin, Azithromycin, Clopidogrel, Codeine, Neosporin [neomycin-bacitracin zn-polymyx], Percocet [oxycodone-acetaminophen], Statins, Sulfamethoxazole-trimethoprim, and Wellbutrin [bupropion]   Review of Systems Review of Systems PER HPI  Physical Exam Triage Vital Signs ED Triage Vitals  Encounter Vitals Group     BP 08/13/23 1020 123/87     Systolic BP Percentile --      Diastolic BP Percentile --      Pulse Rate 08/13/23 1020 83     Resp 08/13/23 1020 11     Temp 08/13/23 1020 98.2 F (36.8 C)     Temp Source 08/13/23 1020 Oral     SpO2 08/13/23 1020 93 %     Weight --      Height --      Head Circumference --      Peak Flow --      Pain Score 08/13/23 1024 7     Pain Loc --      Pain Education --      Exclude from Growth Chart --    No data found.  Updated Vital Signs BP 123/87 (BP Location: Right Arm)   Pulse 83   Temp 98.2 F (36.8 C) (Oral)   Resp 11   SpO2 93%   Visual Acuity Right Eye Distance:   Left Eye Distance:   Bilateral Distance:    Right Eye Near:   Left Eye Near:    Bilateral Near:     Physical Exam Vitals and nursing note reviewed.  Constitutional:       Appearance: Normal appearance. She is not ill-appearing.  HENT:     Head: Atraumatic.  Eyes:     Extraocular Movements: Extraocular movements intact.     Conjunctiva/sclera: Conjunctivae normal.  Cardiovascular:     Rate and Rhythm: Normal rate and regular rhythm.     Heart sounds: Normal  heart sounds.  Pulmonary:     Effort: Pulmonary effort is normal.     Breath sounds: Normal breath sounds. No wheezing or rales.     Comments: Chest rise symmetric bilaterally Musculoskeletal:        General: Tenderness present. Normal range of motion.     Cervical back: Normal range of motion and neck supple.     Comments: Localized tenderness to palpation to the left anterior and lateral rib region just below the breast.  No bony step-offs or abnormalities  Skin:    General: Skin is warm and dry.  Neurological:     Mental Status: She is alert and oriented to person, place, and time.     Motor: No weakness.     Gait: Gait normal.  Psychiatric:        Mood and Affect: Mood normal.        Thought Content: Thought content normal.        Judgment: Judgment normal.      UC Treatments / Results  Labs (all labs ordered are listed, but only abnormal results are displayed) Labs Reviewed - No data to display  EKG   Radiology DG Ribs Unilateral W/Chest Left  Result Date: 08/13/2023 CLINICAL DATA:  Left rib pain for 3 days EXAM: LEFT RIBS AND CHEST - 3+ VIEW COMPARISON:  12/31/2018 FINDINGS: No fracture or other bone lesions are seen involving the ribs. There is no evidence of pneumothorax or pleural effusion. Both lungs are clear. Heart size and mediastinal contours are within normal limits. IMPRESSION: Negative. Electronically Signed   By: Duanne Guess D.O.   On: 08/13/2023 12:53    Procedures Procedures (including critical care time)  Medications Ordered in UC Medications - No data to display  Initial Impression / Assessment and Plan / UC Course  I have reviewed the triage vital signs  and the nursing notes.  Pertinent labs & imaging results that were available during my care of the patient were reviewed by me and considered in my medical decision making (see chart for details).     Vitals and exam very reassuring today, left rib and chest x-ray negative for acute bony abnormality.  Treat with Zanaflex, over-the-counter anti-inflammatories, heat, rest.  Return for worsening symptoms.  Final Clinical Impressions(s) / UC Diagnoses   Final diagnoses:  Rib pain   Discharge Instructions   None    ED Prescriptions     Medication Sig Dispense Auth. Provider   tiZANidine (ZANAFLEX) 4 MG capsule Take 1 capsule (4 mg total) by mouth 3 (three) times daily as needed for muscle spasms. do not drink alcohol or drive while taking this medication.  May cause drowsiness. 15 capsule Particia Nearing, [place]      PDMP not reviewed this encounter.   Particia Nearing, [place] 08/13/23 1429

## 2023-09-09 ENCOUNTER — Ambulatory Visit (INDEPENDENT_AMBULATORY_CARE_PROVIDER_SITE_OTHER): Payer: No Typology Code available for payment source | Admitting: Family

## 2023-09-09 ENCOUNTER — Encounter: Payer: Self-pay | Admitting: Family

## 2023-09-09 VITALS — BP 122/76 | HR 73 | Temp 97.8°F | Ht 60.0 in | Wt 122.6 lb

## 2023-09-09 DIAGNOSIS — F411 Generalized anxiety disorder: Secondary | ICD-10-CM | POA: Diagnosis not present

## 2023-09-09 DIAGNOSIS — Z23 Encounter for immunization: Secondary | ICD-10-CM

## 2023-09-09 MED ORDER — BUSPIRONE HCL 5 MG PO TABS
5.0000 mg | ORAL_TABLET | Freq: Two times a day (BID) | ORAL | 0 refills | Status: DC
Start: 2023-09-09 — End: 2023-12-06

## 2023-09-09 NOTE — Progress Notes (Signed)
Established Patient Office Visit  Subjective:   Patient ID: Toni Mcdonald, female    DOB: [birth year]  Age: 62 y.o. MRN: [id number]  CC:  Chief Complaint  Patient presents with   Anxiety    HPI: Toni Mcdonald is a 62 y.o. female presenting on 09/09/2023 for Anxiety   Worsening stress at work, new systems in place adding to her anxiety. 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 Hard for her to sleep and digestive issues that she feels are worse with stress. She does not currently see a therapist , but she might be open to this.   She has been on wellbutrin in the past but developed hives. Has also tried effexor and paxil.   When really stressed feels like her heart is beating out of her chest.  Not sleeping well at night.  Crying over everything      09/09/2023   10:01 AM 04/10/2023    7:24 AM  GAD 7 : Generalized Anxiety Score  Nervous, Anxious, on Edge 3 1  Control/stop worrying 2 0  Worry too much - different things 2 0  Trouble relaxing 2 0  Restless 2 0  Easily annoyed or irritable 3 1  Afraid - awful might happen 2 0  Total GAD 7 Score 16 2  Anxiety Difficulty Somewhat difficult Not difficult at all    .      ROS: Negative unless specifically indicated above in HPI.   Relevant past medical history reviewed and updated as indicated.   Allergies and medications reviewed and updated.   Current Outpatient Medications:    albuterol (VENTOLIN HFA) 108 (90 Base) MCG/ACT inhaler, Inhale 1-2 puffs into the lungs every 6 (six) hours as needed for wheezing or shortness of breath., Disp: 3 each, Rfl: 3   amLODipine (NORVASC) 5 MG tablet, Take 1 tablet (5 mg total) by mouth daily., Disp: 90 tablet, Rfl: 3   budesonide (RHINOCORT AQUA) 32 MCG/ACT nasal spray, EACH NARE, Disp: 8.43 mL, Rfl: 0   busPIRone (BUSPAR) 5 MG tablet, Take 1 tablet (5 mg total) by mouth 2 (two) times daily., Disp: 180 tablet, Rfl: 0   Cholecalciferol (VITAMIN D) 50 MCG (2000 UT) CAPS,  Take 1 capsule (2,000 Units total) by mouth daily., Disp: 30 capsule, Rfl:    ezetimibe (ZETIA) 10 MG tablet, Take 1 tablet (10 mg total) by mouth daily., Disp: 90 tablet, Rfl: 3   Propylene Glycol 0.6 % SOLN, Place 2 drops into both eyes daily., Disp: , Rfl:   Allergies  Allergen Reactions   Penicillins Anaphylaxis    "whelps"   Erythromycin    Atorvastatin Other (See Comments)    Mental status changes, myalgias   Azithromycin Diarrhea    GI Upset    Clopidogrel Hives and Itching   Codeine Itching and Swelling    "skin crawls'   Effexor [Venlafaxine] Other (See Comments)    Felt weird   Lexapro [Escitalopram] Other (See Comments)    sweating   Neosporin [Neomycin-Bacitracin Zn-Polymyx]     "blisters the skin"   Paxil [Paroxetine] Other (See Comments)    Weight gain    Percocet [Oxycodone-Acetaminophen]     Nausea and vomiting   Statins Other (See Comments)    Mental status changes, myalgias   Sulfamethoxazole-Trimethoprim    Wellbutrin [Bupropion] Hives    Objective:   BP 122/76 (BP Location: Right Arm, Patient Position: Sitting, Cuff Size: Normal)   Pulse 73   Temp 97.8  F (36.6 C) (Temporal)   Ht 5' (1.524 m)   Wt 122 lb 9.6 oz (55.6 kg)   SpO2 97%   BMI 23.94 kg/m    Physical Exam Constitutional:      General: She is not in acute distress.    Appearance: Normal appearance. She is normal weight. She is not ill-appearing, toxic-appearing or diaphoretic.  HENT:     Head: Normocephalic.  Cardiovascular:     Rate and Rhythm: Normal rate and regular rhythm.  Pulmonary:     Effort: Pulmonary effort is normal.     Breath sounds: Normal breath sounds.  Musculoskeletal:        General: Normal range of motion.  Neurological:     General: No focal deficit present.     Mental Status: She is alert and oriented to person, place, and time. Mental status is at baseline.  Psychiatric:        Mood and Affect: Mood normal.        Behavior: Behavior normal.         Thought Content: Thought content normal.        Judgment: Judgment normal.     Assessment & Plan:  Anxiety state Assessment & Plan: Trial start buspirone 5 mg twice daily.  A referral was placed today for therapy      Orders: -     busPIRone HCl; Take 1 tablet (5 mg total) by mouth 2 (two) times daily.  Dispense: 180 tablet; Refill: 0 -     Ambulatory referral to Psychology     Follow up plan: Return in about 3 weeks (around 09/30/2023) for f/u anxiety.  Mort Sawyers, FNP

## 2023-09-09 NOTE — Addendum Note (Signed)
Addended by: Mort Sawyers on: 09/09/2023 10:35 AM   Modules accepted: Level of Service

## 2023-09-09 NOTE — Assessment & Plan Note (Signed)
Trial start buspirone 5 mg twice daily.  A referral was placed today for therapy

## 2023-12-05 ENCOUNTER — Other Ambulatory Visit: Payer: Self-pay | Admitting: Family

## 2023-12-05 DIAGNOSIS — F411 Generalized anxiety disorder: Secondary | ICD-10-CM

## 2023-12-06 NOTE — Telephone Encounter (Signed)
Buspirone was a new medication pt overdue for appt was due in October. Will send small supply but will need to have appt prior to next refill.

## 2023-12-31 ENCOUNTER — Other Ambulatory Visit: Payer: Self-pay | Admitting: Family

## 2023-12-31 DIAGNOSIS — F411 Generalized anxiety disorder: Secondary | ICD-10-CM

## 2024-01-20 ENCOUNTER — Encounter: Payer: Self-pay | Admitting: Emergency Medicine

## 2024-01-20 ENCOUNTER — Ambulatory Visit
Admission: EM | Admit: 2024-01-20 | Discharge: 2024-01-20 | Disposition: A | Discharge status: Home / Self Care | Payer: BC Managed Care – PPO

## 2024-01-20 ENCOUNTER — Other Ambulatory Visit: Payer: Self-pay

## 2024-01-20 DIAGNOSIS — J0140 Acute pansinusitis, unspecified: Secondary | ICD-10-CM

## 2024-01-20 MED ORDER — DOXYCYCLINE HYCLATE 100 MG PO CAPS: 100.0000 mg | ORAL_CAPSULE | Freq: Two times a day (BID) | ORAL | 0 refills | Status: AC

## 2024-01-20 NOTE — Discharge Instructions (Signed)
Take the doxycycline as prescribed to treat for a sinus infection.  Continue nasal saline rinses and Coricidin as needed.  Seek care if symptoms do not improve with treatment.

## 2024-01-20 NOTE — ED Provider Notes (Signed)
[facility]    CSN: [id number] Arrival date & time: 01/20/24  3016      History   Chief Complaint Chief Complaint  Patient presents with   Nasal Congestion    HPI Toni Mcdonald is a 63 y.o. female.   Patient presents today with more than 2-week history of bodyaches and chills, congested cough, chest tightness that is improved today, runny and stuffy nose, sinus pressure, headache, right ear pain without drainage or change in hearing, decreased appetite, and fatigue.  No known fevers, shortness of breath, chest pain abdominal pain, nausea/vomiting, or diarrhea.  Has been taking over-the-counter Coricidin cold and flu for symptoms without much improvement.  Also uses nasal saline daily for symptoms.  Reports husband is sick with similar symptoms.    Past Medical History:  Diagnosis Date   Anxiety    Arthritis    Asthma    Phreesia 02/18/2021   Bronchitis    hx of   Chronic neck pain    GERD (gastroesophageal reflux disease)    Headache(784.0)    migraines   Hypertension    controlled by diet and exercise   Pneumonia    hx of    Patient Active Problem List   Diagnosis Date Noted   Anxiety state 09/09/2023   Postmenopausal 04/10/2023   Allergic rhinitis 10/02/2022   Elevated liver function tests 04/07/2022   Vitiligo 04/02/2022   Mixed hyperlipidemia 04/02/2022   Localized osteoporosis without current pathological fracture 04/02/2022   Vitamin B12 deficiency 04/02/2022   Internal hemorrhoid 04/02/2022   Statin myopathy 04/02/2022   Statin intolerance 04/02/2022   Carotid arterial disease (HCC) 07/25/2019   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   Perennial allergic rhinitis 01/31/2019   Laryngopharyngeal reflux (LPR) 01/31/2019   Tobacco dependence due to cigarettes 01/31/2019   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   Essential hypertension, benign 07/27/2014   Vitamin D deficiency 07/27/2014    Past Surgical History:  Procedure Laterality Date   ANTERIOR  CERVICAL DECOMP/DISCECTOMY FUSION  09/22/2012   Procedure: ANTERIOR CERVICAL DECOMPRESSION/DISCECTOMY FUSION 3 LEVELS;  Surgeon: Tia Alert, MD;  Location: [facility];  Service: Neurosurgery;  Laterality: N/A;  Anterior Cervical Diskectomy/Fusion with Plate, Cervical four through seven   CARDIOVASCULAR STRESS TEST     2008   COLONOSCOPY WITH PROPOFOL N/A 07/10/2016   Procedure: COLONOSCOPY WITH PROPOFOL;  Surgeon: Midge Minium, MD;  Location: [facility];  Service: Endoscopy;  Laterality: N/A;   DIAGNOSTIC LAPAROSCOPY     exploratory surgery due to "scarring"   TOTAL ABDOMINAL HYSTERECTOMY     still with ovaries   TUBAL LIGATION      OB History   No obstetric history on file.      Home Medications    Prior to Admission medications   Medication Sig Start Date End Date Taking? Authorizing Provider  cyanocobalamin (VITAMIN B12) 1000 MCG tablet Take 1,000 mcg by mouth daily.   Yes [provider]  doxycycline (VIBRAMYCIN) 100 MG capsule Take 1 capsule (100 mg total) by mouth 2 (two) times daily for 7 days. 01/20/24 01/27/24 Yes Valentino Nose, NP  albuterol (VENTOLIN HFA) 108 (90 Base) MCG/ACT inhaler Inhale 1-2 puffs into the lungs every 6 (six) hours as needed for wheezing or shortness of breath. 04/10/23   Mort Sawyers, FNP  amLODipine (NORVASC) 5 MG tablet Take 1 tablet (5 mg total) by mouth daily. 04/10/23   Mort Sawyers, FNP  budesonide (RHINOCORT AQUA) 32 MCG/ACT nasal spray EACH  NARE 05/17/20   Lawson Fiscal A, FNP  busPIRone (BUSPAR) 5 MG tablet TAKE 1 TABLET BY MOUTH TWICE A DAY 01/03/24   Mort Sawyers, FNP  Cholecalciferol (VITAMIN D) 50 MCG (2000 UT) CAPS Take 1 capsule (2,000 Units total) by mouth daily. 04/02/22   Mort Sawyers, FNP  ezetimibe (ZETIA) 10 MG tablet Take 1 tablet (10 mg total) by mouth daily. 03/26/23   Mort Sawyers, FNP  Propylene Glycol 0.6 % SOLN Place 2 drops into both eyes daily.    [provider]    Family  History Family History  Problem Relation Age of Onset   Arthritis Mother    Heart disease Mother    Hyperlipidemia Mother    Hypertension Mother    Stroke Mother    Heart disease Father    Hyperlipidemia Father    Hypertension Father    Kidney disease Father    Stroke Father    Hyperthyroidism Sister    Hypothyroidism Sister    Thyroid disease Sister    Early death Brother    Heart disease Brother 42       Died at 28 with MI   Heart attack Brother    Vision loss Maternal Grandfather     Social History Social History   Tobacco Use   Smoking status: Every Day    Current packs/day: 1.00    Average packs/day: 1 pack/day for 35.0 years (35.0 ttl pk-yrs)    Types: Cigarettes   Smokeless tobacco: Never   Tobacco comments:    pack per day 02.24.20  Substance Use Topics   Alcohol use: Yes    Alcohol/week: 4.0 standard drinks of alcohol    Types: 4 Glasses of wine per week    Comment: occasional   Drug use: No     Allergies   Penicillins, Erythromycin, Atorvastatin, Azithromycin, Clopidogrel, Codeine, Effexor [venlafaxine], Lexapro [escitalopram], Neosporin [neomycin-bacitracin zn-polymyx], Paxil [paroxetine], Percocet [oxycodone-acetaminophen], Statins, Sulfamethoxazole-trimethoprim, and Wellbutrin [bupropion]   Review of Systems Review of Systems Per HPI  Physical Exam Triage Vital Signs ED Triage Vitals  Encounter Vitals Group     BP 01/20/24 0944 101/70     Systolic BP Percentile --      Diastolic BP Percentile --      Pulse Rate 01/20/24 0944 88     Resp 01/20/24 0944 20     Temp 01/20/24 0944 98.3 F (36.8 C)     Temp Source 01/20/24 0944 Oral     SpO2 01/20/24 0944 97 %     Weight --      Height --      Head Circumference --      Peak Flow --      Pain Score 01/20/24 0941 4     Pain Loc --      Pain Education --      Exclude from Growth Chart --    No data found.  Updated Vital Signs BP 101/70 (BP Location: Right Arm)   Pulse 88   Temp 98.3  F (36.8 C) (Oral)   Resp 20   SpO2 97%   Visual Acuity Right Eye Distance:   Left Eye Distance:   Bilateral Distance:    Right Eye Near:   Left Eye Near:    Bilateral Near:     Physical Exam Vitals and nursing note reviewed.  Constitutional:      General: She is not in acute distress.    Appearance: Normal appearance. She is not ill-appearing or toxic-appearing.  HENT:     Head: Normocephalic and atraumatic.     Right Ear: Tympanic membrane, ear canal and external ear normal.     Left Ear: Tympanic membrane, ear canal and external ear normal.     Nose: Congestion present. No rhinorrhea.     Right Sinus: Maxillary sinus tenderness and frontal sinus tenderness present.     Left Sinus: Maxillary sinus tenderness and frontal sinus tenderness present.     Mouth/Throat:     Mouth: Mucous membranes are moist.     Pharynx: Oropharynx is clear. Posterior oropharyngeal erythema present. No oropharyngeal exudate.  Eyes:     General: No scleral icterus.    Extraocular Movements: Extraocular movements intact.  Cardiovascular:     Rate and Rhythm: Normal rate and regular rhythm.  Pulmonary:     Effort: Pulmonary effort is normal. No respiratory distress.     Breath sounds: Normal breath sounds. No wheezing, rhonchi or rales.  Musculoskeletal:     Cervical back: Normal range of motion and neck supple.  Lymphadenopathy:     Cervical: No cervical adenopathy.  Skin:    General: Skin is warm and dry.     Capillary Refill: Capillary refill takes less than 2 seconds.     Coloration: Skin is not jaundiced or pale.     Findings: No erythema or rash.  Neurological:     Mental Status: She is alert and oriented to person, place, and time.  Psychiatric:        Behavior: Behavior is cooperative.      UC Treatments / Results  Labs (all labs ordered are listed, but only abnormal results are displayed) Labs Reviewed - No data to display  EKG   Radiology No results  found.  Procedures Procedures (including critical care time)  Medications Ordered in UC Medications - No data to display  Initial Impression / Assessment and Plan / UC Course  I have reviewed the triage vital signs and the nursing notes.  Pertinent labs & imaging results that were available during my care of the patient were reviewed by me and considered in my medical decision making (see chart for details).   Patient is well-appearing, normotensive, afebrile, not tachycardic, not tachypneic, oxygenating well on room air.    1. Acute non-recurrent pansinusitis Given length of symptoms and allergy to penicillin, sulfa antibiotics, will treat with doxycycline twice daily for 7 days Encouraged continuing over-the-counter guaifenesin, nasal saline rinses Return and ER precautions discussed with patient  The patient was given the opportunity to ask questions.  All questions answered to their satisfaction.  The patient is in agreement to this plan.    Final Clinical Impressions(s) / UC Diagnoses   Final diagnoses:  Acute non-recurrent pansinusitis     Discharge Instructions      Take the doxycycline as prescribed to treat for a sinus infection.  Continue nasal saline rinses and Coricidin as needed.  Seek care if symptoms do not improve with treatment.     ED Prescriptions     Medication Sig Dispense Auth. Provider   doxycycline (VIBRAMYCIN) 100 MG capsule Take 1 capsule (100 mg total) by mouth 2 (two) times daily for 7 days. 14 capsule Valentino Nose, NP      PDMP not reviewed this encounter.   Valentino Nose, NP 01/20/24 1049

## 2024-01-20 NOTE — ED Triage Notes (Addendum)
Pt reports cough,nasal congestion, sore throat,runny nose, body aches for last several weeks. Denies any known fevers.

## 2024-01-26 ENCOUNTER — Other Ambulatory Visit: Payer: Self-pay | Admitting: Family

## 2024-01-26 DIAGNOSIS — F411 Generalized anxiety disorder: Secondary | ICD-10-CM

## 2024-03-13 NOTE — Progress Notes (Unsigned)
 MRN : [id number]  Toni Mcdonald is a 63 y.o. ([birth year]) female who presents with chief complaint of check carotid arteries.  History of Present Illness:   The patient is seen for evaluation of carotid stenosis. The carotid stenosis was identified remotely but she has not had recent follow up.   The patient denies amaurosis fugax. There is no recent history of TIA symptoms or focal motor deficits. There is no prior documented CVA.   There is no history of migraine headaches. There is no history of seizures.   The patient is taking enteric-coated aspirin 81 mg daily.  There have been no major changes in her overall health over the past year.   The patient does not have a history of coronary artery disease, no recent episodes of angina or shortness of breath.  The patient denies PAD or claudication symptoms.  She actually reports she walks several miles every day at work.  There is a history of hyperlipidemia which is being treated with a statin.    Duplex ultrasound of the carotid arteries obtained today demonstrates RICA 1-39% and LICA 60-79%.  No change in the LICA stenosis when compared to the previous study, the velocities are almost identical between the last study and today's study  No outpatient medications have been marked as taking for the 03/14/24 encounter (Appointment) with Gilda Crease, Latina Craver, MD.    Past Medical History:  Diagnosis Date   Anxiety    Arthritis    Asthma    Phreesia 02/18/2021   Bronchitis    hx of   Chronic neck pain    GERD (gastroesophageal reflux disease)    Headache(784.0)    migraines   Hypertension    controlled by diet and exercise   Pneumonia    hx of    Past Surgical History:  Procedure Laterality Date   ANTERIOR CERVICAL DECOMP/DISCECTOMY FUSION  09/22/2012   Procedure: ANTERIOR CERVICAL DECOMPRESSION/DISCECTOMY FUSION 3 LEVELS;  Surgeon: Tia Alert, MD;  Location: [facility];  Service:  Neurosurgery;  Laterality: N/A;  Anterior Cervical Diskectomy/Fusion with Plate, Cervical four through seven   CARDIOVASCULAR STRESS TEST     2008   COLONOSCOPY WITH PROPOFOL N/A 07/10/2016   Procedure: COLONOSCOPY WITH PROPOFOL;  Surgeon: Midge Minium, MD;  Location: [facility];  Service: Endoscopy;  Laterality: N/A;   DIAGNOSTIC LAPAROSCOPY     exploratory surgery due to "scarring"   TOTAL ABDOMINAL HYSTERECTOMY     still with ovaries   TUBAL LIGATION      Social History Social History   Tobacco Use   Smoking status: Every Day    Current packs/day: 1.00    Average packs/day: 1 pack/day for 35.0 years (35.0 ttl pk-yrs)    Types: Cigarettes   Smokeless tobacco: Never   Tobacco comments:    pack per day 02.24.20  Substance Use Topics   Alcohol use: Yes    Alcohol/week: 4.0 standard drinks of alcohol    Types: 4 Glasses of wine per week    Comment: occasional   Drug use: No    Family History Family History  Problem Relation Age of Onset   Arthritis Mother    Heart disease Mother    Hyperlipidemia Mother    Hypertension Mother    Stroke Mother    Heart disease Father    Hyperlipidemia Father  Hypertension Father    Kidney disease Father    Stroke Father    Hyperthyroidism Sister    Hypothyroidism Sister    Thyroid disease Sister    Early death Brother    Heart disease Brother 37       Died at 30 with MI   Heart attack Brother    Vision loss Maternal Grandfather     Allergies  Allergen Reactions   Penicillins Anaphylaxis    "whelps"   Erythromycin    Atorvastatin Other (See Comments)    Mental status changes, myalgias   Azithromycin Diarrhea    GI Upset    Clopidogrel Hives and Itching   Codeine Itching and Swelling    "skin crawls'   Effexor [Venlafaxine] Other (See Comments)    Felt weird   Lexapro [Escitalopram] Other (See Comments)    sweating   Neosporin [Neomycin-Bacitracin Zn-Polymyx]     "blisters the skin"   Paxil [Paroxetine]  Other (See Comments)    Weight gain    Percocet [Oxycodone-Acetaminophen]     Nausea and vomiting   Statins Other (See Comments)    Mental status changes, myalgias   Sulfamethoxazole-Trimethoprim    Wellbutrin [Bupropion] Hives     REVIEW OF SYSTEMS (Negative unless checked)  Constitutional: [] Weight loss  [] Fever  [] Chills Cardiac: [] Chest pain   [] Chest pressure   [] Palpitations   [] Shortness of breath when laying flat   [] Shortness of breath with exertion. Vascular:  [x] Pain in legs with walking   [] Pain in legs at rest  [] History of DVT   [] Phlebitis   [] Swelling in legs   [] Varicose veins   [] Non-healing ulcers Pulmonary:   [] Uses home oxygen   [] Productive cough   [] Hemoptysis   [] Wheeze  [x] COPD   [x] Asthma Neurologic:  [] Dizziness   [] Seizures   [] History of stroke   [] History of TIA  [] Aphasia   [] Vissual changes   [] Weakness or numbness in arm   [] Weakness or numbness in leg Musculoskeletal:   [] Joint swelling   [x] Joint pain   [] Low back pain Hematologic:  [] Easy bruising  [] Easy bleeding   [] Hypercoagulable state   [] Anemic Gastrointestinal:  [] Diarrhea   [] Vomiting  [] Gastroesophageal reflux/heartburn   [] Difficulty swallowing. Genitourinary:  [] Chronic kidney disease   [] Difficult urination  [] Frequent urination   [] Blood in urine Skin:  [] Rashes   [] Ulcers  Psychological:  [x] History of anxiety   []  History of major depression.  Physical Examination  There were no vitals filed for this visit. There is no height or weight on file to calculate BMI. Gen: WD/WN, NAD Head: /AT, No temporalis wasting.  Ear/Nose/Throat: Hearing grossly intact, nares w/o erythema or drainage Eyes: PER, EOMI, sclera nonicteric.  Neck: Supple, no masses.  No bruit or JVD.  Pulmonary:  Good air movement, no audible wheezing, no use of accessory muscles.  Cardiac: RRR, normal S1, S2, no Murmurs. Vascular:  carotid bruit noted Vessel Right Left  Radial Palpable Palpable  Carotid  Palpable   Palpable  Gastrointestinal: soft, non-distended. No guarding/no peritoneal signs.  Musculoskeletal: M/S 5/5 throughout.  No visible deformity.  Neurologic: CN 2-12 intact. Pain and light touch intact in extremities.  Symmetrical.  Speech is fluent. Motor exam as listed above. Psychiatric: Judgment intact, Mood & affect appropriate for pt's clinical situation. Dermatologic: No rashes or ulcers noted.  No changes consistent with cellulitis.   CBC Lab Results  Component Value Date   WBC 7.8 04/10/2023   HGB 14.1 04/10/2023   HCT  42.3 04/10/2023   MCV 87.1 04/10/2023   PLT 382.0 04/10/2023    BMET    Component Value Date/Time   NA 141 04/10/2023 0800   K 4.6 04/10/2023 0800   CL 105 04/10/2023 0800   CO2 27 04/10/2023 0800   GLUCOSE 99 04/10/2023 0800   BUN 8 04/10/2023 0800   CREATININE 0.82 04/10/2023 0800   CREATININE 0.76 02/19/2021 0835   CALCIUM 9.4 04/10/2023 0800   GFRNONAA 87 02/19/2021 0835   GFRAA 101 02/19/2021 0835   CrCl cannot be calculated (Patient's most recent lab result is older than the maximum 21 days allowed.).  COAG Lab Results  Component Value Date   INR 1.0 06/17/2016   INR 0.92 09/15/2012    Radiology No results found.   Assessment/Plan 1. Bilateral carotid artery occlusion (Primary) Recommend:   Given the patient's asymptomatic subcritical stenosis no further invasive testing or surgery at this time.   Duplex ultrasound of the carotid arteries obtained today demonstrates RICA 1-39% and LICA 60-79%.  No change in the LICA stenosis when compared to the previous study.   Continue antiplatelet therapy as prescribed Continue management of CAD, HTN and Hyperlipidemia Healthy heart diet,  encouraged exercise at least 4 times per week Follow up in 12 months with duplex ultrasound and physical exam  - VAS US CAROTID; Future  2. Essential hypertension, benign Continue antihypertensive medications as already ordered, these medications have been  reviewed and there are no changes at this time.  3. COPD with asthma (HCC) Continue pulmonary medications and aerosols as already ordered, these medications have been reviewed and there are no changes at this time.   4. Mixed hyperlipidemia Continue statin as ordered and reviewed, no changes at this time    Levora Dredge, MD  03/13/2024 11:28 AM

## 2024-03-14 ENCOUNTER — Ambulatory Visit (INDEPENDENT_AMBULATORY_CARE_PROVIDER_SITE_OTHER): Payer: No Typology Code available for payment source

## 2024-03-14 ENCOUNTER — Encounter (INDEPENDENT_AMBULATORY_CARE_PROVIDER_SITE_OTHER): Payer: Self-pay | Admitting: Vascular Surgery

## 2024-03-14 ENCOUNTER — Ambulatory Visit (INDEPENDENT_AMBULATORY_CARE_PROVIDER_SITE_OTHER): Payer: No Typology Code available for payment source | Admitting: Vascular Surgery

## 2024-03-14 VITALS — BP 102/75 | HR 77 | Resp 16 | Wt 116.2 lb

## 2024-03-14 DIAGNOSIS — I10 Essential (primary) hypertension: Secondary | ICD-10-CM

## 2024-03-14 DIAGNOSIS — E782 Mixed hyperlipidemia: Secondary | ICD-10-CM

## 2024-03-14 DIAGNOSIS — I6523 Occlusion and stenosis of bilateral carotid arteries: Secondary | ICD-10-CM

## 2024-03-14 DIAGNOSIS — J4489 Other specified chronic obstructive pulmonary disease: Secondary | ICD-10-CM

## 2024-03-16 ENCOUNTER — Other Ambulatory Visit: Payer: Self-pay | Admitting: Family

## 2024-03-16 DIAGNOSIS — E7800 Pure hypercholesterolemia, unspecified: Secondary | ICD-10-CM

## 2024-05-06 ENCOUNTER — Encounter: Payer: Self-pay | Admitting: Family

## 2024-05-06 ENCOUNTER — Ambulatory Visit (INDEPENDENT_AMBULATORY_CARE_PROVIDER_SITE_OTHER): Admitting: Family

## 2024-05-06 VITALS — BP 110/82 | HR 62 | Temp 98.2°F | Ht 60.0 in | Wt 116.8 lb

## 2024-05-06 DIAGNOSIS — Z23 Encounter for immunization: Secondary | ICD-10-CM | POA: Diagnosis not present

## 2024-05-06 DIAGNOSIS — F411 Generalized anxiety disorder: Secondary | ICD-10-CM

## 2024-05-06 DIAGNOSIS — M816 Localized osteoporosis [Lequesne]: Secondary | ICD-10-CM | POA: Diagnosis not present

## 2024-05-06 DIAGNOSIS — E538 Deficiency of other specified B group vitamins: Secondary | ICD-10-CM

## 2024-05-06 DIAGNOSIS — F17210 Nicotine dependence, cigarettes, uncomplicated: Secondary | ICD-10-CM

## 2024-05-06 DIAGNOSIS — E782 Mixed hyperlipidemia: Secondary | ICD-10-CM

## 2024-05-06 DIAGNOSIS — Z0001 Encounter for general adult medical examination with abnormal findings: Secondary | ICD-10-CM

## 2024-05-06 DIAGNOSIS — E559 Vitamin D deficiency, unspecified: Secondary | ICD-10-CM

## 2024-05-06 DIAGNOSIS — Z1231 Encounter for screening mammogram for malignant neoplasm of breast: Secondary | ICD-10-CM | POA: Diagnosis not present

## 2024-05-06 DIAGNOSIS — Z0000 Encounter for general adult medical examination without abnormal findings: Secondary | ICD-10-CM | POA: Insufficient documentation

## 2024-05-06 DIAGNOSIS — Z87891 Personal history of nicotine dependence: Secondary | ICD-10-CM | POA: Insufficient documentation

## 2024-05-06 LAB — BASIC METABOLIC PANEL WITH GFR
BUN: 10 mg/dL (ref 6–23)
CO2: 28 meq/L (ref 19–32)
Calcium: 9.3 mg/dL (ref 8.4–10.5)
Chloride: 104 meq/L (ref 96–112)
Creatinine, Ser: 0.79 mg/dL (ref 0.40–1.20)
GFR: 80.92 mL/min (ref >60.00)
Glucose, Bld: 95 mg/dL (ref 70–99)
Potassium: 4.6 meq/L (ref 3.5–5.1)
Sodium: 140 meq/L (ref 135–145)

## 2024-05-06 LAB — CBC
HCT: 44 % (ref 36.0–46.0)
Hemoglobin: 14.7 g/dL (ref 12.0–15.0)
MCHC: 33.5 g/dL (ref 30.0–36.0)
MCV: 88.5 fl (ref 78.0–100.0)
Platelets: 407 10*3/uL — ABNORMAL HIGH (ref 150.0–400.0)
RBC: 4.97 Mil/uL (ref 3.87–5.11)
RDW: 14.3 % (ref 11.5–15.5)
WBC: 9.9 10*3/uL (ref 4.0–10.5)

## 2024-05-06 LAB — LIPID PANEL
Cholesterol: 182 mg/dL (ref 0–200)
HDL: 72.8 mg/dL (ref >39.00)
LDL Cholesterol: 89 mg/dL (ref 0–99)
NonHDL: 109.04
Total CHOL/HDL Ratio: 2
Triglycerides: 98 mg/dL (ref 0.0–149.0)
VLDL: 19.6 mg/dL (ref 0.0–40.0)

## 2024-05-06 LAB — VITAMIN D 25 HYDROXY (VIT D DEFICIENCY, FRACTURES): VITD: 50.89 ng/mL (ref 30.00–100.00)

## 2024-05-06 LAB — VITAMIN B12: Vitamin B-12: 1198 pg/mL — ABNORMAL HIGH (ref 211–911)

## 2024-05-06 MED ORDER — NICOTINE 21 MG/24HR TD PT24
21.0000 mg | MEDICATED_PATCH | Freq: Every day | TRANSDERMAL | 1 refills | Status: DC
Start: 2024-05-06 — End: 2024-09-05

## 2024-05-06 NOTE — Assessment & Plan Note (Addendum)
 Smoking cessation instruction/counseling given:  counseled patient on the dangers of tobacco use, advised patient to stop smoking, and reviewed strategies to maximize success Start 21 mg nicotine  patch.

## 2024-05-06 NOTE — Assessment & Plan Note (Signed)

## 2024-05-06 NOTE — Patient Instructions (Signed)

## 2024-05-06 NOTE — Assessment & Plan Note (Signed)
 Ordered lipid panel, pending results. Work on low cholesterol diet and exercise as tolerated

## 2024-05-06 NOTE — Progress Notes (Signed)
 Subjective:  Patient ID: Toni Mcdonald, female    DOB: 24-Apr-1961  Age: 63 y.o. MRN: [id number]  Patient Care Team: Toni Horns, FNP as PCP - General (Family Medicine)   CC:  Chief Complaint  Patient presents with   Annual Exam    HPI Toni Mcdonald is a 63 y.o. female who presents today for an annual physical exam. She reports consuming a general diet. The patient does not participate in regular exercise at present.walks 5-7 miles a day at work She generally feels well. She reports sleeping well. She does not have additional problems to discuss today.   Vision:Within last year Dental:Receives regular dental care  Lung Cancer Screening with low-dose Chest CT: ordered - Adults age 30-80 who are current cigarette smokers or quit within the last 15 years. Must have 20 pack year history.   Mammogram: 06/12/23  Last pap: 03/05/21 negative pap  Colonoscopy: 07/10/26 every ten years.  Bone density scan: 2016 osteoporosis AP spine osteopenia in the left hip   Pt is without acute concerns.   Anxiety, started last visit on buspar  5 mg twice daily and she states it has been great.  Stressors are getting handled and improving.   HTN: doing well on amlodipine  5 mg once daily.   Smoking, still smoking. She treid chantix and wellbutrin in the past without any improvement.   Advanced Directives Patient does not have advanced directives   DEPRESSION SCREENING    05/06/2024    7:35 AM 09/09/2023   10:01 AM 04/10/2023    7:24 AM 04/02/2022    8:24 AM 07/25/2019    8:08 AM 07/21/2018    3:15 PM 08/22/2016   10:48 AM  PHQ 2/9 Scores  PHQ - 2 Score 0 4 1 0 0 0 0  PHQ- 9 Score 2 19  0   0     ROS: Negative unless specifically indicated above in HPI.    Current Outpatient Medications:    albuterol  (VENTOLIN  HFA) 108 (90 Base) MCG/ACT inhaler, Inhale 1-2 puffs into the lungs every 6 (six) hours as needed for wheezing or shortness of breath., Disp: 3 each, Rfl: 3   amLODipine   (NORVASC ) 5 MG tablet, Take 1 tablet (5 mg total) by mouth daily., Disp: 90 tablet, Rfl: 3   budesonide  (RHINOCORT  AQUA) 32 MCG/ACT nasal spray, EACH NARE, Disp: 8.43 mL, Rfl: 0   busPIRone  (BUSPAR ) 5 MG tablet, TAKE 1 TABLET BY MOUTH TWICE A DAY, Disp: 60 tablet, Rfl: 0   Cholecalciferol (VITAMIN D ) 50 MCG (2000 UT) CAPS, Take 1 capsule (2,000 Units total) by mouth daily., Disp: 30 capsule, Rfl:    cyanocobalamin (VITAMIN B12) 1000 MCG tablet, Take 1,000 mcg by mouth daily., Disp: , Rfl:    ezetimibe  (ZETIA ) 10 MG tablet, TAKE 1 TABLET BY MOUTH EVERY DAY, Disp: 90 tablet, Rfl: 1   nicotine  (NICODERM CQ  - DOSED IN MG/24 HOURS) 21 mg/24hr patch, Place 1 patch (21 mg total) onto the skin daily., Disp: 28 patch, Rfl: 1   Propylene Glycol 0.6 % SOLN, Place 2 drops into both eyes daily., Disp: , Rfl:     Objective:    BP 110/82 (BP Location: Right Arm, Patient Position: Sitting, Cuff Size: Normal)   Pulse 62   Temp 98.2 F (36.8 C) (Temporal)   Ht 5' (1.524 m)   Wt 116 lb 12.8 oz (53 kg)   SpO2 98%   BMI 22.81 kg/m   BP Readings from Last 3 Encounters:  05/06/24 110/82  03/14/24 102/75  01/20/24 101/70      Physical Exam Constitutional:      General: She is not in acute distress.    Appearance: Normal appearance. She is normal weight. She is not ill-appearing.  HENT:     Head: Normocephalic.     Right Ear: Tympanic membrane normal.     Left Ear: Tympanic membrane normal.     Nose: Nose normal.     Mouth/Throat:     Mouth: Mucous membranes are moist.  Eyes:     Extraocular Movements: Extraocular movements intact.     Pupils: Pupils are equal, round, and reactive to light.  Cardiovascular:     Rate and Rhythm: Normal rate and regular rhythm.  Pulmonary:     Effort: Pulmonary effort is normal.     Breath sounds: Normal breath sounds.  Abdominal:     General: Abdomen is flat. Bowel sounds are normal.     Palpations: Abdomen is soft.     Tenderness: There is no guarding or  rebound.  Musculoskeletal:        General: Normal range of motion.     Cervical back: Normal range of motion.  Skin:    General: Skin is warm.     Capillary Refill: Capillary refill takes less than 2 seconds.  Neurological:     General: No focal deficit present.     Mental Status: She is alert.  Psychiatric:        Mood and Affect: Mood normal.        Behavior: Behavior normal.        Thought Content: Thought content normal.        Judgment: Judgment normal.          Assessment & Plan:  Tobacco dependence due to cigarettes Assessment & Plan: Smoking cessation instruction/counseling given:  counseled patient on the dangers of tobacco use, advised patient to stop smoking, and reviewed strategies to maximize success Start 21 mg nicotine  patch.    Orders: -     Nicotine ; Place 1 patch (21 mg total) onto the skin daily.  Dispense: 28 patch; Refill: 1 -     Ambulatory Referral for Lung Cancer Scre  Localized osteoporosis without current pathological fracture -     DG Bone Density; Future  Screening mammogram for breast cancer -     3D Screening Mammogram, Left and Right; Future  History of smoking greater than 50 pack years -     Ambulatory Referral for Lung Cancer Scre  Mixed hyperlipidemia Assessment & Plan: Ordered lipid panel, pending results. Work on low cholesterol diet and exercise as tolerated   Orders: -     Lipid panel  Vitamin B12 deficiency -     Vitamin B12  Vitamin D  deficiency -     VITAMIN D  25 Hydroxy (Vit-D Deficiency, Fractures)  Anxiety state  Encounter for general adult medical examination without abnormal findings Assessment & Plan: Patient Counseling(The following topics were reviewed):  Preventative care handout given to pt  Health maintenance and immunizations reviewed. Please refer to Health maintenance section. Pt advised on safe sex, wearing seatbelts in car, and proper nutrition labwork ordered today for annual Dental health:  Discussed importance of regular tooth brushing, flossing, and dental visits.   Orders: -     Lipid panel -     Basic metabolic panel with GFR -     CBC  Need for vaccination against Streptococcus pneumoniae -  Pneumococcal conjugate vaccine 20-valent      Follow-up: Return in about 6 weeks (around 06/17/2024) for f/u nicotine  patch.   Toni Horns, FNP

## 2024-05-17 ENCOUNTER — Encounter (INDEPENDENT_AMBULATORY_CARE_PROVIDER_SITE_OTHER): Payer: Self-pay

## 2024-05-19 ENCOUNTER — Telehealth: Payer: Self-pay | Admitting: Acute Care

## 2024-05-19 ENCOUNTER — Other Ambulatory Visit: Payer: Self-pay

## 2024-05-19 DIAGNOSIS — Z122 Encounter for screening for malignant neoplasm of respiratory organs: Secondary | ICD-10-CM

## 2024-05-19 DIAGNOSIS — Z87891 Personal history of nicotine dependence: Secondary | ICD-10-CM

## 2024-05-19 DIAGNOSIS — F17210 Nicotine dependence, cigarettes, uncomplicated: Secondary | ICD-10-CM

## 2024-05-19 NOTE — Telephone Encounter (Signed)
 Lung Cancer Screening Narrative/Criteria Questionnaire (Cigarette Smokers Only- No Cigars/Pipes/vapes)   Toni Mcdonald   SDMV:06/06/24 at 9am / [facility]                                           December 26, 1961               LDCT: 06/07/24 at 1030a/[facility]    63 y.o.   Phone: [phone]  Lung Screening Narrative (confirm age 41-77 yrs Medicare / 50-80 yrs Private pay insurance)   Insurance information:BCBS   Referring Provider:Dugal   This screening involves an initial phone call with a team member from our program. It is called a shared decision making visit. The initial meeting is required by insurance and Medicare to make sure you understand the program. This appointment takes about 15-20 minutes to complete. The CT scan will completed at a separate date/time. This scan takes about 5-10 minutes to complete and you may eat and drink before and after the scan.  Criteria questions for Lung Cancer Screening:   Are you a current or former smoker? Current Age began smoking: 63yo   If you are a former smoker, what year did you quit smoking? NA   To calculate your smoking history, I need an accurate estimate of how many packs of cigarettes you smoked per day and for how many years. (Not just the number of PPD you are now smoking)   Years smoking 49 x Packs per day 1 = Pack years 49   (at least 20 pack yrs)   (Make sure they understand that we need to know how much they have smoked in the past, not just the number of PPD they are smoking now)  Do you have a personal history of cancer?  No    Do you have a family history of cancer? No  Are you coughing up blood?  No  Have you had unexplained weight loss of 15 lbs or more in the last 6 months? No  It looks like you meet all criteria.     Additional information: N/A

## 2024-06-06 ENCOUNTER — Encounter: Payer: Self-pay | Admitting: Physician Assistant

## 2024-06-06 ENCOUNTER — Ambulatory Visit: Admitting: Physician Assistant

## 2024-06-06 DIAGNOSIS — F17210 Nicotine dependence, cigarettes, uncomplicated: Secondary | ICD-10-CM

## 2024-06-06 NOTE — Patient Instructions (Signed)

## 2024-06-06 NOTE — Progress Notes (Signed)
 Virtual Visit via Telephone Note I connected with Lyris Tseng on 06/06/24 at  0900 by telephone and verified that I am speaking with the correct person using two identifiers.  Location: Patient: home Provider: working virtually from home   I discussed the limitations, risks, security and privacy concerns of performing an evaluation and management service by telephone and the availability of in person appointments. I also discussed with the patient that there may be a patient responsible charge related to this service. The patient expressed understanding and agreed to proceed.     Shared Decision Making Visit Lung Cancer Screening Program [id number])   Eligibility: Age 63 Pack Years Smoking History Calculation 67 (# packs/per year x # years smoked) Recent History of coughing up blood  No Unexplained weight loss? No ( >Than 15 pounds within the last 6 months ) Prior History Lung / other cancer No (Diagnosis within the last 5 years already requiring surveillance chest CT Scans). Smoking Status Current Smoker  Visit Components: Discussion included one or more decision making aids. Yes Discussion included risk/benefits of screening. Yes Discussion included potential follow up diagnostic testing for abnormal scans. Yes Discussion included meaning and risk of over diagnosis. Yes Discussion included meaning and risk of False Positives. Yes Discussion included meaning of total radiation exposure. Yes  Counseling Included: Importance of adherence to annual lung cancer LDCT screening. Yes Impact of comorbidities on ability to participate in the program. Yes Ability and willingness to under diagnostic treatment: Yes  Smoking Cessation Counseling: Current Smokers:  Discussed importance of smoking cessation. Yes Information about tobacco cessation classes and interventions provided to patient. Yes Symptomatic Patient. No Diagnosis Code: Tobacco Use Z72.0 Asymptomatic Patient  Yes  Counseling (Intermediate counseling: > three minutes counseling) U0454 Information about tobacco cessation classes and interventions provided to patient. Yes Written Order for Lung Cancer Screening with LDCT placed in Epic. Yes (CT Chest Lung Cancer Screening Low Dose W/O CM) [id number] Z12.2-Screening of respiratory organs Z87.891-Personal history of nicotine  dependence   I have spent 25 minutes of face to face/ virtual visit  time with the patient discussing the risks and benefits of lung cancer screening. We discussed the above noted topics. We paused at intervals to allow for questions to be asked and answered to ensure understanding.We discussed that the single most powerful action that anyone can take to decrease their risk of developing lung cancer is to quit smoking.  We discussed options for tools to aid in quitting smoking including nicotine  replacement therapy, non-nicotine  medications, support groups, Quit Smart classes, and behavior modification. We discussed that often times setting smaller, more achievable goals, such as eliminating 1 cigarette a day for a week and then 2 cigarettes a day for a week can be helpful in slowly decreasing the number of cigarettes smoked. I provided  them  with smoking cessation  information  with contact information for community resources, classes, free nicotine  replacement therapy, and access to mobile apps, text messaging, and on-line smoking cessation help. I have also provided  them  the office contact information in the event they have any questions. We discussed the time and location of the scan, and that either Pearl Bottcher RN, Deann Exon, RN  or I will call / send a letter with the results within 24-72 hours of receiving them. The patient verbalized understanding of all of  the above and had no further questions upon leaving the office. They have my contact information in the event they have any further questions.  I spent 3 minutes counseling  on smoking cessation and the health risks of continued tobacco abuse.  I explained to the patient that there has been a high incidence of coronary artery disease noted on these exams. I explained that this is a non-gated exam therefore degree or severity cannot be determined. This patient is not on statin therapy. I have asked the patient to follow-up with their PCP regarding any incidental finding of coronary artery disease and management with diet or medication as their PCP  feels is clinically indicated. The patient verbalized understanding of the above and had no further questions upon completion of the visit.    Patt Boozer Ger Ringenberg, PA-C

## 2024-06-07 ENCOUNTER — Ambulatory Visit
Admission: RE | Admit: 2024-06-07 | Discharge: 2024-06-07 | Disposition: A | Discharge status: Home / Self Care | Source: Ambulatory Visit | Attending: Acute Care | Admitting: Acute Care

## 2024-06-07 DIAGNOSIS — F17210 Nicotine dependence, cigarettes, uncomplicated: Secondary | ICD-10-CM | POA: Insufficient documentation

## 2024-06-07 DIAGNOSIS — Z87891 Personal history of nicotine dependence: Secondary | ICD-10-CM | POA: Diagnosis not present

## 2024-06-07 DIAGNOSIS — Z122 Encounter for screening for malignant neoplasm of respiratory organs: Secondary | ICD-10-CM | POA: Insufficient documentation

## 2024-06-11 ENCOUNTER — Other Ambulatory Visit: Payer: Self-pay | Admitting: Family

## 2024-06-11 DIAGNOSIS — I10 Essential (primary) hypertension: Secondary | ICD-10-CM

## 2024-06-16 ENCOUNTER — Ambulatory Visit
Admission: RE | Admit: 2024-06-16 | Discharge: 2024-06-16 | Disposition: A | Discharge status: Home / Self Care | Source: Ambulatory Visit | Attending: Family | Admitting: Family

## 2024-06-16 ENCOUNTER — Ambulatory Visit: Payer: Self-pay | Admitting: Family

## 2024-06-16 DIAGNOSIS — Z1231 Encounter for screening mammogram for malignant neoplasm of breast: Secondary | ICD-10-CM | POA: Insufficient documentation

## 2024-06-16 DIAGNOSIS — F411 Generalized anxiety disorder: Secondary | ICD-10-CM

## 2024-06-16 DIAGNOSIS — M816 Localized osteoporosis [Lequesne]: Secondary | ICD-10-CM | POA: Insufficient documentation

## 2024-06-16 DIAGNOSIS — Z780 Asymptomatic menopausal state: Secondary | ICD-10-CM | POA: Diagnosis not present

## 2024-06-16 DIAGNOSIS — M810 Age-related osteoporosis without current pathological fracture: Secondary | ICD-10-CM | POA: Diagnosis not present

## 2024-06-21 ENCOUNTER — Other Ambulatory Visit: Payer: Self-pay | Admitting: Family

## 2024-06-21 ENCOUNTER — Other Ambulatory Visit: Payer: Self-pay

## 2024-06-21 DIAGNOSIS — F411 Generalized anxiety disorder: Secondary | ICD-10-CM

## 2024-06-21 DIAGNOSIS — Z122 Encounter for screening for malignant neoplasm of respiratory organs: Secondary | ICD-10-CM

## 2024-06-21 DIAGNOSIS — R928 Other abnormal and inconclusive findings on diagnostic imaging of breast: Secondary | ICD-10-CM

## 2024-06-21 DIAGNOSIS — Z87891 Personal history of nicotine dependence: Secondary | ICD-10-CM

## 2024-06-21 DIAGNOSIS — F17210 Nicotine dependence, cigarettes, uncomplicated: Secondary | ICD-10-CM

## 2024-06-21 MED ORDER — ALENDRONATE SODIUM 70 MG PO TABS: 70.0000 mg | ORAL_TABLET | ORAL | 11 refills | Status: DC

## 2024-06-21 NOTE — Addendum Note (Signed)
 Addended by: CORWIN ANTU on: 06/21/2024 05:55 PM   Modules accepted: Orders

## 2024-06-22 MED ORDER — BUSPIRONE HCL 5 MG PO TABS: 5.0000 mg | ORAL_TABLET | Freq: Two times a day (BID) | ORAL | 2 refills | Status: AC

## 2024-06-22 NOTE — Addendum Note (Signed)
 Addended by: CORWIN ANTU on: 06/22/2024 05:36 PM   Modules accepted: Orders

## 2024-06-22 NOTE — Addendum Note (Signed)
 Addended by: WENDELL ARLAND RAMAN on: 06/22/2024 04:42 PM   Modules accepted: Orders

## 2024-06-23 ENCOUNTER — Ambulatory Visit
Admission: RE | Admit: 2024-06-23 | Discharge: 2024-06-23 | Disposition: A | Discharge status: Home / Self Care | Source: Ambulatory Visit | Attending: Family | Admitting: Family

## 2024-06-23 ENCOUNTER — Ambulatory Visit: Payer: Self-pay | Admitting: Family

## 2024-06-23 DIAGNOSIS — R928 Other abnormal and inconclusive findings on diagnostic imaging of breast: Secondary | ICD-10-CM | POA: Diagnosis not present

## 2024-06-23 DIAGNOSIS — R92333 Mammographic heterogeneous density, bilateral breasts: Secondary | ICD-10-CM | POA: Diagnosis not present

## 2024-06-23 DIAGNOSIS — N6002 Solitary cyst of left breast: Secondary | ICD-10-CM | POA: Diagnosis not present

## 2024-06-23 DIAGNOSIS — N6325 Unspecified lump in the left breast, overlapping quadrants: Secondary | ICD-10-CM | POA: Diagnosis not present

## 2024-06-23 NOTE — Progress Notes (Signed)
 noted

## 2024-08-10 DIAGNOSIS — B9689 Other specified bacterial agents as the cause of diseases classified elsewhere: Secondary | ICD-10-CM | POA: Diagnosis not present

## 2024-08-10 DIAGNOSIS — J441 Chronic obstructive pulmonary disease with (acute) exacerbation: Secondary | ICD-10-CM | POA: Diagnosis not present

## 2024-08-10 DIAGNOSIS — R051 Acute cough: Secondary | ICD-10-CM | POA: Diagnosis not present

## 2024-08-10 DIAGNOSIS — J019 Acute sinusitis, unspecified: Secondary | ICD-10-CM | POA: Diagnosis not present

## 2024-08-30 ENCOUNTER — Encounter (HOSPITAL_COMMUNITY): Payer: Self-pay | Admitting: *Deleted

## 2024-08-30 ENCOUNTER — Emergency Department (HOSPITAL_COMMUNITY)

## 2024-08-30 ENCOUNTER — Ambulatory Visit: Payer: Self-pay

## 2024-08-30 ENCOUNTER — Emergency Department (HOSPITAL_COMMUNITY)
Admission: EM | Admit: 2024-08-30 | Discharge: 2024-08-30 | Disposition: A | Discharge status: Home / Self Care | Source: Ambulatory Visit | Attending: Emergency Medicine | Admitting: Emergency Medicine

## 2024-08-30 ENCOUNTER — Other Ambulatory Visit: Payer: Self-pay

## 2024-08-30 DIAGNOSIS — R000 Tachycardia, unspecified: Secondary | ICD-10-CM | POA: Diagnosis not present

## 2024-08-30 DIAGNOSIS — Z7951 Long term (current) use of inhaled steroids: Secondary | ICD-10-CM | POA: Diagnosis not present

## 2024-08-30 DIAGNOSIS — R0602 Shortness of breath: Secondary | ICD-10-CM | POA: Diagnosis not present

## 2024-08-30 DIAGNOSIS — J441 Chronic obstructive pulmonary disease with (acute) exacerbation: Secondary | ICD-10-CM | POA: Diagnosis not present

## 2024-08-30 DIAGNOSIS — Z79899 Other long term (current) drug therapy: Secondary | ICD-10-CM | POA: Insufficient documentation

## 2024-08-30 DIAGNOSIS — I10 Essential (primary) hypertension: Secondary | ICD-10-CM | POA: Insufficient documentation

## 2024-08-30 DIAGNOSIS — J4489 Other specified chronic obstructive pulmonary disease: Secondary | ICD-10-CM

## 2024-08-30 DIAGNOSIS — R059 Cough, unspecified: Secondary | ICD-10-CM | POA: Diagnosis not present

## 2024-08-30 LAB — CBC WITH DIFFERENTIAL/PLATELET
Abs Immature Granulocytes: 0.01 K/uL (ref 0.00–0.07)
Basophils Absolute: 0.2 K/uL — ABNORMAL HIGH (ref 0.0–0.1)
Basophils Relative: 2 %
Eosinophils Absolute: 0.7 K/uL — ABNORMAL HIGH (ref 0.0–0.5)
Eosinophils Relative: 8 %
HCT: 45 % (ref 36.0–46.0)
Hemoglobin: 14.9 g/dL (ref 12.0–15.0)
Immature Granulocytes: 0 %
Lymphocytes Relative: 28 %
Lymphs Abs: 2.4 K/uL (ref 0.7–4.0)
MCH: 29 pg (ref 26.0–34.0)
MCHC: 33.1 g/dL (ref 30.0–36.0)
MCV: 87.5 fL (ref 80.0–100.0)
Monocytes Absolute: 0.7 K/uL (ref 0.1–1.0)
Monocytes Relative: 8 %
Neutro Abs: 4.8 K/uL (ref 1.7–7.7)
Neutrophils Relative %: 54 %
Platelets: 398 K/uL (ref 150–400)
RBC: 5.14 MIL/uL — ABNORMAL HIGH (ref 3.87–5.11)
RDW: 13.1 % (ref 11.5–15.5)
WBC: 8.8 K/uL (ref 4.0–10.5)
nRBC: 0 % (ref 0.0–0.2)

## 2024-08-30 LAB — BLOOD GAS, VENOUS
Acid-Base Excess: 2.1 mmol/L — ABNORMAL HIGH (ref 0.0–2.0)
Bicarbonate: 25.5 mmol/L (ref 20.0–28.0)
Drawn by: 69260
O2 Saturation: 85.8 %
Patient temperature: 36.8
pCO2, Ven: 35 mmHg — ABNORMAL LOW (ref 44–60)
pH, Ven: 7.47 — ABNORMAL HIGH (ref 7.25–7.43)
pO2, Ven: 52 mmHg — ABNORMAL HIGH (ref 32–45)

## 2024-08-30 LAB — COMPREHENSIVE METABOLIC PANEL WITH GFR
ALT: 31 U/L (ref 0–44)
AST: 28 U/L (ref 15–41)
Albumin: 4 g/dL (ref 3.5–5.0)
Alkaline Phosphatase: 124 U/L (ref 38–126)
Anion gap: 11 (ref 5–15)
BUN: 10 mg/dL (ref 8–23)
CO2: 23 mmol/L (ref 22–32)
Calcium: 9.4 mg/dL (ref 8.9–10.3)
Chloride: 105 mmol/L (ref 98–111)
Creatinine, Ser: 0.77 mg/dL (ref 0.44–1.00)
GFR, Estimated: >60 mL/min (ref >60)
Glucose, Bld: 99 mg/dL (ref 70–99)
Potassium: 4 mmol/L (ref 3.5–5.1)
Sodium: 139 mmol/L (ref 135–145)
Total Bilirubin: 0.6 mg/dL (ref 0.0–1.2)
Total Protein: 6.9 g/dL (ref 6.5–8.1)

## 2024-08-30 LAB — BRAIN NATRIURETIC PEPTIDE: B Natriuretic Peptide: 50 pg/mL (ref 0.0–100.0)

## 2024-08-30 LAB — RESP PANEL BY RT-PCR (RSV, FLU A&B, COVID)  RVPGX2
Influenza A by PCR: NEGATIVE
Influenza B by PCR: NEGATIVE
Resp Syncytial Virus by PCR: NEGATIVE
SARS Coronavirus 2 by RT PCR: NEGATIVE

## 2024-08-30 LAB — MAGNESIUM: Magnesium: 2.2 mg/dL (ref 1.7–2.4)

## 2024-08-30 MED ORDER — IPRATROPIUM-ALBUTEROL 0.5-2.5 (3) MG/3ML IN SOLN
3.0000 mL | Freq: Once | RESPIRATORY_TRACT | Status: AC
  Administered 2024-08-30: 3 mL via RESPIRATORY_TRACT
  Filled 2024-08-30: qty 3

## 2024-08-30 MED ORDER — PREDNISONE 10 MG PO TABS: 40.0000 mg | ORAL_TABLET | Freq: Every day | ORAL | 0 refills | Status: AC

## 2024-08-30 MED ORDER — ALBUTEROL SULFATE (2.5 MG/3ML) 0.083% IN NEBU
2.5000 mg | INHALATION_SOLUTION | Freq: Once | RESPIRATORY_TRACT | Status: AC
  Administered 2024-08-30: 2.5 mg via RESPIRATORY_TRACT
  Filled 2024-08-30: qty 3

## 2024-08-30 MED ORDER — ALBUTEROL SULFATE HFA 108 (90 BASE) MCG/ACT IN AERS: 1.0000 | INHALATION_SPRAY | Freq: Four times a day (QID) | RESPIRATORY_TRACT | 3 refills | Status: AC | PRN

## 2024-08-30 MED ORDER — IOHEXOL 350 MG/ML SOLN
75.0000 mL | Freq: Once | INTRAVENOUS | Status: AC | PRN
  Administered 2024-08-30: 75 mL via INTRAVENOUS

## 2024-08-30 MED ORDER — METHYLPREDNISOLONE SODIUM SUCC 125 MG IJ SOLR
125.0000 mg | Freq: Once | INTRAMUSCULAR | Status: AC
  Administered 2024-08-30: 125 mg via INTRAVENOUS
  Filled 2024-08-30: qty 2

## 2024-08-30 MED ORDER — ALBUTEROL SULFATE (2.5 MG/3ML) 0.083% IN NEBU: 2.5000 mg | INHALATION_SOLUTION | Freq: Four times a day (QID) | RESPIRATORY_TRACT | 12 refills | Status: AC | PRN

## 2024-08-30 MED ORDER — ALBUTEROL SULFATE HFA 108 (90 BASE) MCG/ACT IN AERS
2.0000 | INHALATION_SPRAY | Freq: Once | RESPIRATORY_TRACT | Status: AC
  Administered 2024-08-30: 2 via RESPIRATORY_TRACT
  Filled 2024-08-30: qty 6.7

## 2024-08-30 NOTE — ED Triage Notes (Signed)
 Pt c/o increased sob since 8/13; pt states she was seen by a doctor and given an inhaler, steroids, and antibiotics to treat pneumonia; pt states she is not feeling any better  Pt states she is coughing up white sputum  Pt states she has also been having heart palpitations

## 2024-08-30 NOTE — Telephone Encounter (Signed)
   FYI Only or Action Required?: FYI only for provider.  Patient was last seen in primary care on 05/06/2024 by Corwin Antu, FNP.  Called Nurse Triage reporting COPD.  Symptoms began several days ago.  Interventions attempted: Prescription medications: Albuterol .  Symptoms are: gradually worsening.  Triage Disposition: Go to ED Now (Notify PCP)  Patient/caregiver understands and will follow disposition?: yes         Copied from CRM #[id number]. Topic: Clinical - Red Word Triage >> Aug 30, 2024  8:16 AM Tiffini S wrote: [facility] that prompted transfer to Nurse Triage: COPD flair up- symptoms are shortness of breath, chest pain, coughing up foam. Transferred to triage nurse. Reason for Disposition  [1] MODERATE difficulty breathing (e.g., speaks in phrases, SOB even at rest, pulse 100-120) AND [2] NEW-onset or WORSE than normal  Answer Assessment - Initial Assessment Questions 1. RESPIRATORY STATUS: Describe your breathing? (e.g., wheezing, shortness of breath, unable to speak, severe coughing)      SOB, coughing up foam, severe coughing  2. ONSET: When did this breathing problem begin?      Sunday got worse  3. PATTERN Does the difficult breathing come and go, or has it been constant since it started?      Comes and goes  4. SEVERITY: How bad is your breathing? (e.g., mild, moderate, severe)      Moderate to severe 5. RECURRENT SYMPTOM: Have you had difficulty breathing before? If Yes, ask: When was the last time? and What happened that time?      yes  7. LUNG HISTORY: Do you have any history of lung disease?  (e.g., pulmonary embolus, asthma, emphysema)     COPD 8. CAUSE: What do you think is causing the breathing problem?      COPD  9. OTHER SYMPTOMS: Do you have any other symptoms? (e.g., chest pain, cough, dizziness, fever, runny nose)     Cough,  Protocols used: Breathing Difficulty-A-AH

## 2024-08-30 NOTE — Discharge Instructions (Addendum)
 Your test results today are reassuring.  A prescription for an additional 5 days of steroids was sent to your pharmacy.  Take as prescribed.  Use inhaler with spacer or nebulizer as needed for cough, chest tightness, or shortness of breath.  Avoid smoking or other lung irritants.  Return to the emergency department for any new or worsening symptoms of concern.

## 2024-08-30 NOTE — Telephone Encounter (Signed)
 NOTED

## 2024-08-30 NOTE — ED Provider Notes (Signed)
 [place] EMERGENCY DEPARTMENT AT [facility] Provider Note   CSN: [id number] Arrival date & time: 08/30/24  9077     Patient presents with: Shortness of Breath   Toni Mcdonald is a 63 y.o. female.    Shortness of Breath Associated symptoms: cough   Patient presents for shortness of breath.  Medical history includes COPD, arthritis, anxiety, GERD, HTN, osteoporosis.  She saw her PCP 3 weeks ago for similar symptoms.  She was prescribed inhaler, steroids, and antibiotics (moxifloxacin).  She took her medications as prescribed.  She did not have any improvement in symptoms.  Cough has been productive of white sputum.  She has been having recent palpitations.  She does not use nebulizers at home.     Prior to Admission medications   Medication Sig Start Date End Date Taking? Authorizing Provider  albuterol  (PROVENTIL ) (2.5 MG/3ML) 0.083% nebulizer solution Take 3 mLs (2.5 mg total) by nebulization every 6 (six) hours as needed for wheezing or shortness of breath. 08/30/24  Yes Melvenia Motto, MD  predniSONE  (DELTASONE ) 10 MG tablet Take 4 tablets (40 mg total) by mouth daily for 5 days. 08/30/24 09/04/24 Yes Melvenia Motto, MD  albuterol  (VENTOLIN  HFA) 108 (90 Base) MCG/ACT inhaler Inhale 1-2 puffs into the lungs every 6 (six) hours as needed for wheezing or shortness of breath. 08/30/24   Melvenia Motto, MD  alendronate  (FOSAMAX ) 70 MG tablet Take 1 tablet (70 mg total) by mouth every 7 (seven) days. Take with a full glass of water  on an empty stomach. 06/21/24   Corwin Antu, FNP  amLODipine  (NORVASC ) 5 MG tablet TAKE 1 TABLET (5 MG TOTAL) BY MOUTH DAILY. 06/13/24   Dugal, Tabitha, FNP  budesonide  (RHINOCORT  AQUA) 32 MCG/ACT nasal spray EACH NARE 05/17/20   Bates, Crystal A, FNP  busPIRone  (BUSPAR ) 5 MG tablet Take 1 tablet (5 mg total) by mouth 2 (two) times daily. 06/22/24   Dugal, Tabitha, FNP  Cholecalciferol (VITAMIN D ) 50 MCG (2000 UT) CAPS Take 1 capsule (2,000 Units total) by mouth  daily. 04/02/22   Dugal, Tabitha, FNP  ezetimibe  (ZETIA ) 10 MG tablet TAKE 1 TABLET BY MOUTH EVERY DAY 03/16/24   Corwin Antu, FNP  moxifloxacin (AVELOX) 400 MG tablet Take 400 mg by mouth daily. 08/10/24   [provider]  nicotine  (NICODERM CQ  - DOSED IN MG/24 HOURS) 21 mg/24hr patch Place 1 patch (21 mg total) onto the skin daily. 05/06/24   Dugal, Tabitha, FNP  promethazine-dextromethorphan (PROMETHAZINE-DM) 6.25-15 MG/5ML syrup Take 5 mLs by mouth every 6 (six) hours as needed for cough. 08/10/24   [provider]  Propylene Glycol 0.6 % SOLN Place 2 drops into both eyes daily.    [provider]    Allergies: Penicillins, Atorvastatin , Azithromycin , Erythromycin, Sulfamethoxazole-trimethoprim, Percocet [oxycodone-acetaminophen ], Statins, Clopidogrel , Codeine, Effexor [venlafaxine], Lexapro [escitalopram], Neosporin [neomycin-bacitracin  zn-polymyx], Paxil [paroxetine], and Wellbutrin [bupropion]    Review of Systems  Respiratory:  Positive for cough, chest tightness and shortness of breath.   All other systems reviewed and are negative.   Updated Vital Signs BP 116/75   Pulse (!) 105   Temp 98.2 F (36.8 C) (Oral)   Resp (!) 25   Ht 5' (1.524 m)   Wt 53.5 kg   SpO2 92%   BMI 23.05 kg/m   Physical Exam Vitals and nursing note reviewed.  Constitutional:      General: She is not in acute distress.    Appearance: She is well-developed. She is not ill-appearing,  toxic-appearing or diaphoretic.  HENT:     Head: Normocephalic and atraumatic.     Mouth/Throat:     Mouth: Mucous membranes are moist.  Eyes:     Conjunctiva/sclera: Conjunctivae normal.  Cardiovascular:     Rate and Rhythm: Regular rhythm. Tachycardia present.     Heart sounds: No murmur heard. Pulmonary:     Effort: Pulmonary effort is normal. Tachypnea present. No respiratory distress.     Breath sounds: Wheezing present.  Chest:     Chest wall: No tenderness.  Abdominal:      Palpations: Abdomen is soft.     Tenderness: There is no abdominal tenderness.  Musculoskeletal:        General: No swelling. Normal range of motion.     Cervical back: Normal range of motion and neck supple.  Skin:    General: Skin is warm and dry.     Coloration: Skin is not cyanotic or pale.  Neurological:     General: No focal deficit present.     Mental Status: She is alert and oriented to person, place, and time.  Psychiatric:        Mood and Affect: Mood normal.        Behavior: Behavior normal.     (all labs ordered are listed, but only abnormal results are displayed) Labs Reviewed  BLOOD GAS, VENOUS - Abnormal; Notable for the following components:      Result Value   pH, Ven 7.47 (*)    pCO2, Ven 35 (*)    pO2, Ven 52 (*)    Acid-Base Excess 2.1 (*)    All other components within normal limits  CBC WITH DIFFERENTIAL/PLATELET - Abnormal; Notable for the following components:   RBC 5.14 (*)    Eosinophils Absolute 0.7 (*)    Basophils Absolute 0.2 (*)    All other components within normal limits  RESP PANEL BY RT-PCR (RSV, FLU A&B, COVID)  [id number]  COMPREHENSIVE METABOLIC PANEL WITH GFR  BRAIN NATRIURETIC PEPTIDE  MAGNESIUM  I-STAT CHEM 8, ED    EKG: None  Radiology: CT Angio Chest PE W and/or Wo Contrast Result Date: 08/30/2024 CLINICAL DATA:  Short of breath.  Cough and sputum EXAM: CT ANGIOGRAPHY CHEST WITH CONTRAST TECHNIQUE: Multidetector CT imaging of the chest was performed using the standard protocol during bolus administration of intravenous contrast. Multiplanar CT image reconstructions and MIPs were obtained to evaluate the vascular anatomy. RADIATION DOSE REDUCTION: This exam was performed according to the departmental dose-optimization program which includes automated exposure control, adjustment of the mA and/or kV according to patient size and/or use of iterative reconstruction technique. CONTRAST:  75mL OMNIPAQUE  IOHEXOL  350 MG/ML SOLN COMPARISON:   None Available. FINDINGS: Cardiovascular: No filling defects within the pulmonary arteries to suggest acute pulmonary embolism. No significant vascular findings. Normal heart size. No pericardial effusion. Mediastinum/Nodes: No axillary or supraclavicular adenopathy. No mediastinal or hilar adenopathy. No pericardial fluid. Esophagus normal. Lungs/Pleura: No pulmonary infarction. No pneumonia. No pleural fluid. No pneumothorax Upper Abdomen: Limited view of the liver, kidneys, pancreas are unremarkable. Normal adrenal glands. Musculoskeletal: No aggressive osseous lesion. Review of the MIP images confirms the above findings. IMPRESSION: 1. No evidence acute pulmonary embolism. 2. No acute pulmonary parenchymal findings. Electronically Signed   By: Jackquline Boxer M.D.   On: 08/30/2024 13:18   DG Chest Port 1 View Result Date: 08/30/2024 CLINICAL DATA:  Shortness of breath EXAM: PORTABLE CHEST 1 VIEW COMPARISON:  August 13, 2023 FINDINGS: The heart  size and mediastinal contours are within normal limits. No focal consolidation. No pleural effusions. No pneumothorax. No acute osseous findings. IMPRESSION: No active disease. Electronically Signed   By: Michaeline Blanch M.D.   On: 08/30/2024 10:57     Procedures   Medications Ordered in the ED  albuterol  (VENTOLIN  HFA) 108 (90 Base) MCG/ACT inhaler 2 puff (has no administration in time range)  methylPREDNISolone  sodium succinate (SOLU-MEDROL ) 125 mg/2 mL injection 125 mg (125 mg Intravenous Given 08/30/24 1015)  ipratropium-albuterol  (DUONEB) 0.5-2.5 (3) MG/3ML nebulizer solution 3 mL (3 mLs Nebulization Given 08/30/24 1049)  albuterol  (PROVENTIL ) (2.5 MG/3ML) 0.083% nebulizer solution 2.5 mg (2.5 mg Nebulization Given 08/30/24 1222)  iohexol  (OMNIPAQUE ) 350 MG/ML injection 75 mL (75 mLs Intravenous Contrast Given 08/30/24 1213)                                    Medical Decision Making Amount and/or Complexity of Data Reviewed Labs: ordered. Radiology:  ordered.  Risk Prescription drug management.   This patient presents to the ED for concern of cough and shortness of breath, this involves an extensive number of treatment options, and is a complaint that carries with it a high risk of complications and morbidity.  The differential diagnosis includes COPD exacerbation, pneumonia, CHF, URI   Co morbidities / Chronic conditions that complicate the patient evaluation  COPD, arthritis, anxiety, GERD, HTN, osteoporosis   Additional history obtained:  Additional history obtained from EMR External records from outside source obtained and reviewed including N/A   Lab Tests:  I Ordered, and personally interpreted labs.  The pertinent results include: Normal hemoglobin, no leukocytosis, normal kidney function, normal electrolytes, normal BNP   Imaging Studies ordered:  I ordered imaging studies including chest x-ray, CTA chest I independently visualized and interpreted imaging which showed no acute findings I agree with the radiologist interpretation   Cardiac Monitoring: / EKG:  The patient was maintained on a cardiac monitor.  I personally viewed and interpreted the cardiac monitored which showed an underlying rhythm of: Sinus rhythm   Problem List / ED Course / Critical interventions / Medication management  Patient presenting for ongoing cough, shortness of breath, chest tightness.  Initial onset of symptoms was 3 weeks ago.  She did not have improvement following treatment with prednisone , albuterol , and antibiotics.  On arrival in the ED, patient is tachycardic and tachypneic.  Current SpO2 is in the low 90s on room air.  She has frequent coughing.  On lung auscultation, she does have expiratory wheezing.  DuoNeb, Solu-Medrol  were ordered.  Workup was initiated.  Patient's workup results were unremarkable.  She did have improvement following DuoNeb followed by albuterol  nebulized breathing treatments.  Lungs are now clear to  auscultation.  Tachypnea has resolved.  Coughing has improved.  She states that she uses an inhaler at home but without spacer.  Patient was provided with albuterol  inhaler and spacer.  Patient was prescribed burst of steroids.  She was discharged in stable condition. I ordered medication including Solu-Medrol , DuoNeb, albuterol  for COPD exacerbation Reevaluation of the patient after these medicines showed that the patient improved I have reviewed the patients home medicines and have made adjustments as needed  Social Determinants of Health:  Lives independently     Final diagnoses:  COPD exacerbation [facility])    ED Discharge Orders          Ordered    albuterol  (  VENTOLIN  HFA) 108 (90 Base) MCG/ACT inhaler  Every 6 hours PRN        08/30/24 1428    albuterol  (PROVENTIL ) (2.5 MG/3ML) 0.083% nebulizer solution  Every 6 hours PRN        08/30/24 1428    For home use only DME Nebulizer machine        08/30/24 1428    predniSONE  (DELTASONE ) 10 MG tablet  Daily        08/30/24 1428               Melvenia Motto, MD 08/30/24 1430

## 2024-09-05 ENCOUNTER — Ambulatory Visit: Admitting: Family

## 2024-09-05 ENCOUNTER — Encounter: Payer: Self-pay | Admitting: Family

## 2024-09-05 VITALS — BP 110/82 | HR 87 | Temp 98.1°F | Ht 60.0 in | Wt 119.2 lb

## 2024-09-05 DIAGNOSIS — J439 Emphysema, unspecified: Secondary | ICD-10-CM | POA: Diagnosis not present

## 2024-09-05 MED ORDER — FLUTICASONE FUROATE-VILANTEROL 100-25 MCG/ACT IN AEPB: 1.0000 | INHALATION_SPRAY | Freq: Every day | RESPIRATORY_TRACT | 11 refills | Status: DC

## 2024-09-05 NOTE — Progress Notes (Signed)
 Established Patient Office Visit  Subjective:      CC:  Chief Complaint  Patient presents with   Follow-up    ED follow up    HPI: Toni Mcdonald is a 63 y.o. female presenting on 09/05/2024 for Follow-up (ED follow up) .  Discussed the use of AI scribe software for clinical note transcription with the patient, who gave verbal consent to proceed.  History of Present Illness Toni Mcdonald is a 63 year old female with COPD who presents with shortness of breath.  She has a history of COPD and was previously treated at the [facility] in [place] with an inhaler, steroids, and antibiotics including moxifloxacin. Her symptoms began with allergies, including watery eyes and congestion, which progressed to chest tightness and a sensation of bronchitis. A chest x-ray was performed, and she was treated for pneumonia.  In the emergency room, her oxygen saturation was in the low nineties on room air, and she had a high heart rate with expiratory wheezing. She received Duoneb and Solu-Medrol  treatments, which improved her condition. She was prescribed Solu-Medrol , DuoNeb, and albuterol  for COPD exacerbation and completed a course of prednisone  40 mg daily for five days, which she finished yesterday with noted improvement. Prior to hospitalization, she was using her albuterol  inhaler every two hours, but now uses it once or twice a day. She continues to experience a persistent cough, especially at night.  Her CTA imaging showed no pulmonary embolism, significant vascular findings, adenopathy, pneumonia, pleural fluid, or pneumothorax. She reports heart palpitations and racing heart, which she attributes to frequent albuterol  use and anxiety. She has a history of using Breo Ellipta  in 2020, which she tolerated well and found helpful, but has not used it since. She is currently taking Singulair  (montelukast ) but suspects it may have worsened her vitiligo.  No recent fever. She uses  promethazine dextromethorphan cough syrup sparingly since August 13th, as it causes drowsiness. She elevates her bed to manage symptoms but finds it ineffective. She last used her albuterol  inhaler at 2 or 3 AM today. No current sinus pressure.         Social history:  Relevant past medical, surgical, family and social history reviewed and updated as indicated. Interim medical history since our last visit reviewed.  Allergies and medications reviewed and updated.  DATA REVIEWED: CHART IN EPIC     ROS: Negative unless specifically indicated above in HPI.    Current Outpatient Medications:    albuterol  (PROVENTIL ) (2.5 MG/3ML) 0.083% nebulizer solution, Take 3 mLs (2.5 mg total) by nebulization every 6 (six) hours as needed for wheezing or shortness of breath., Disp: 75 mL, Rfl: 12   albuterol  (VENTOLIN  HFA) 108 (90 Base) MCG/ACT inhaler, Inhale 1-2 puffs into the lungs every 6 (six) hours as needed for wheezing or shortness of breath., Disp: 3 each, Rfl: 3   amLODipine  (NORVASC ) 5 MG tablet, TAKE 1 TABLET (5 MG TOTAL) BY MOUTH DAILY., Disp: 90 tablet, Rfl: 3   budesonide  (RHINOCORT  AQUA) 32 MCG/ACT nasal spray, EACH NARE, Disp: 8.43 mL, Rfl: 0   busPIRone  (BUSPAR ) 5 MG tablet, Take 1 tablet (5 mg total) by mouth 2 (two) times daily., Disp: 180 tablet, Rfl: 2   Cholecalciferol (VITAMIN D ) 50 MCG (2000 UT) CAPS, Take 1 capsule (2,000 Units total) by mouth daily., Disp: 30 capsule, Rfl:    ezetimibe  (ZETIA ) 10 MG tablet, TAKE 1 TABLET BY MOUTH EVERY DAY, Disp: 90 tablet, Rfl: 1   fluticasone  furoate-vilanterol (  BREO ELLIPTA ) 100-25 MCG/ACT AEPB, Inhale 1 puff into the lungs daily., Disp: 1 each, Rfl: 11   promethazine-dextromethorphan (PROMETHAZINE-DM) 6.25-15 MG/5ML syrup, Take 5 mLs by mouth every 6 (six) hours as needed for cough., Disp: , Rfl:    Propylene Glycol 0.6 % SOLN, Place 2 drops into both eyes daily., Disp: , Rfl:    alendronate  (FOSAMAX ) 70 MG tablet, Take 1 tablet (70 mg  total) by mouth every 7 (seven) days. Take with a full glass of water  on an empty stomach. (Patient not taking: Reported on 09/05/2024), Disp: 4 tablet, Rfl: 11        Objective:        BP 110/82 (BP Location: Left Arm, Patient Position: Sitting, Cuff Size: Normal)   Pulse 87   Temp 98.1 F (36.7 C) (Temporal)   Ht 5' (1.524 m)   Wt 119 lb 3.2 oz (54.1 kg)   SpO2 95%   BMI 23.28 kg/m   Physical Exam VITALS: P- 87 HEENT: Throat, nose, and ears without active infection. CHEST: Minimal wheezing on auscultation. CARDIOVASCULAR: Heart rate 87, slightly elevated with activity. Heart rhythm normal.  Wt Readings from Last 3 Encounters:  09/05/24 119 lb 3.2 oz (54.1 kg)  08/30/24 118 lb (53.5 kg)  06/07/24 116 lb (52.6 kg)    Physical Exam Vitals reviewed.  Constitutional:      General: She is not in acute distress.    Appearance: Normal appearance. She is normal weight. She is not ill-appearing, toxic-appearing or diaphoretic.  HENT:     Head: Normocephalic.     Right Ear: Tympanic membrane normal.     Left Ear: Tympanic membrane normal.     Nose: Nose normal.     Mouth/Throat:     Mouth: Mucous membranes are dry.     Pharynx: No oropharyngeal exudate or posterior oropharyngeal erythema.  Eyes:     Extraocular Movements: Extraocular movements intact.     Pupils: Pupils are equal, round, and reactive to light.  Cardiovascular:     Rate and Rhythm: Normal rate and regular rhythm.     Pulses: Normal pulses.     Heart sounds: Normal heart sounds.  Pulmonary:     Effort: Pulmonary effort is normal.     Breath sounds: Normal breath sounds.  Musculoskeletal:     Cervical back: Normal range of motion.  Neurological:     General: No focal deficit present.     Mental Status: She is alert and oriented to person, place, and time. Mental status is at baseline.  Psychiatric:        Mood and Affect: Mood normal.        Behavior: Behavior normal.        Thought Content: Thought  content normal.        Judgment: Judgment normal.     Wt Readings from Last 3 Encounters:  09/05/24 119 lb 3.2 oz (54.1 kg)  08/30/24 118 lb (53.5 kg)  06/07/24 116 lb (52.6 kg)   Temp Readings from Last 3 Encounters:  09/05/24 98.1 F (36.7 C) (Temporal)  08/30/24 97.7 F (36.5 C) (Oral)  05/06/24 98.2 F (36.8 C) (Temporal)   BP Readings from Last 3 Encounters:  09/05/24 110/82  08/30/24 103/85  05/06/24 110/82    Pulse Readings from Last 3 Encounters:  09/05/24 87  08/30/24 (!) 107  05/06/24 62        Results LABS Hemoglobin: Normal (08/30/2024) WBC: No leukocytosis (08/30/2024) Kidney function: Normal (08/30/2024) Electrolytes:  Normal (08/30/2024) BNP: Normal (08/30/2024) Eosinophils: Elevated (08/30/2024)  RADIOLOGY Chest X-ray: No active disease (08/30/2024) CTA Chest: No pulmonary embolism, no significant vascular findings, no adenopathy, no pneumonia, no pleural effusion, no pneumothorax (08/30/2024) CT Lung: Emphysema with diffuse bronchial wall thickening  DIAGNOSTIC ECG: Underlying sinus rhythm (08/30/2024) Stress test: Normal  Assessment & Plan:   Assessment and Plan Assessment & Plan Chronic obstructive pulmonary disease (COPD) with recent exacerbation and emphysema Recent exacerbation of COPD with emphysema, characterized by shortness of breath, expiratory wheezing, and increased use of albuterol  inhaler. CTA of the chest showed no pulmonary embolism, pneumonia, or other acute findings. Previous treatment with prednisone  and DuoNeb showed improvement. Currently using albuterol  inhaler once or twice daily. No current wheezing on examination, but persistent cough noted. Effective use of Breo Ellipta  in 2020. - Prescribe Breo Ellipta , one inhalation daily. - Attempt insurance coverage for Breo Ellipta , consider alternatives if not covered. - Refer to pulmonologist for further evaluation and potential repeat lung screening or testing. - Follow up  in two weeks to assess response to Breo Ellipta .  Allergic rhinitis and acute sinusitis Symptoms include nasal congestion, watery eyes, and swollen glands, initially triggered by ragweed allergies. Eosinophilia noted, consistent with allergic response. Previous use of Allegra D for congestion.  Chronic cough Persistent cough likely related to recent COPD exacerbation and sinusitis. Cough persists despite previous use of promethazine with dextromethorphan, which causes sedation. No current sinus pressure or active infection noted. - Recommend using plain Mucinex to help thin mucus. - Advise against Mucinex DM due to potential to raise heart rate.  Tachycardia Episodes associated with increased use of albuterol  inhaler during COPD exacerbation. Heart rate currently 87 bpm, normal rhythm. Previous cardiology evaluation showed no significant findings except a benign 'blip'.  Anxiety disorder Anxiety exacerbated by recent health issues, including COPD exacerbation and tachycardia.  Follow-Up - Follow up in two weeks to reassess symptoms and response to Breo Ellipta . - Instruct to contact if symptoms worsen or if there are issues with medication coverage.  Recording duration: 13 minutes      Return in about 2 weeks (around 09/19/2024) for follow up breo .     Ginger Patrick, MSN, APRN, FNP-C [facility]

## 2024-09-07 ENCOUNTER — Other Ambulatory Visit: Payer: Self-pay | Admitting: Family

## 2024-09-07 DIAGNOSIS — E7800 Pure hypercholesterolemia, unspecified: Secondary | ICD-10-CM

## 2024-09-29 ENCOUNTER — Ambulatory Visit: Admitting: Student in an Organized Health Care Education/Training Program

## 2024-09-29 ENCOUNTER — Encounter: Payer: Self-pay | Admitting: Student in an Organized Health Care Education/Training Program

## 2024-09-29 VITALS — BP 110/80 | HR 91 | Temp 98.6°F | Ht 60.0 in | Wt 118.6 lb

## 2024-09-29 DIAGNOSIS — F17210 Nicotine dependence, cigarettes, uncomplicated: Secondary | ICD-10-CM | POA: Diagnosis not present

## 2024-09-29 DIAGNOSIS — J42 Unspecified chronic bronchitis: Secondary | ICD-10-CM | POA: Diagnosis not present

## 2024-09-29 DIAGNOSIS — F17200 Nicotine dependence, unspecified, uncomplicated: Secondary | ICD-10-CM

## 2024-09-29 DIAGNOSIS — J4540 Moderate persistent asthma, uncomplicated: Secondary | ICD-10-CM | POA: Diagnosis not present

## 2024-09-29 MED ORDER — SPIRIVA RESPIMAT 2.5 MCG/ACT IN AERS: 2.0000 | INHALATION_SPRAY | Freq: Every day | RESPIRATORY_TRACT | 12 refills | Status: DC

## 2024-09-29 NOTE — Patient Instructions (Signed)
 Today, I ordered blood work. You can get them draw at your preferred [facility] draw station. The nearest one to [facility] is at nearby [facility] ([address] [place], [place], [place] [address]).

## 2024-09-29 NOTE — Progress Notes (Signed)
 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  PFTs back in 2020 showed normal spirometry with some scooping of the flow-volume loop at low lung volumes suggesting an obstructive process. She was started on Breo Ellipta  which helped her symptoms but subsequently ran out of the prescription.  She has felt much better since this was restarted.  Historic CBC data shows and is in for count of 700 earlier this month which in the setting of somewhat normal spirometry and her symptoms is suggestive of asthma.  Her significant smoking history could suggest a component of COPD overlap with her asthma.  For workup, I will repeat her pulm function test with spirometry, lung volumes, and DLCO.  I will also obtain an allergen panel to assess for T-helper cell type II response given eosinophilia.  She is currently on ICS/LABA with Breo Ellipta  which I will add LAMA therapy with Spiriva.  Finally, she is enrolled in our lung cancer screening program.  - Pulmonary Function Test; Future - Allergen Panel (27) + IGE - Tiotropium Bromide Monohydrate (SPIRIVA RESPIMAT) 2.5 MCG/ACT AERS; Inhale 2 puffs into the lungs daily.  Dispense: 60 each; Refill: 12  #Tobacco use disorder  She currently smokes up to a pack a day.  She has been trying to quit and has not found patches to be helpful.  She is using oral nicotine  replacement which has helped a little.  She would continue to work on smoking cessation and I have counseled her at length about the importance of this given her suspected underlying asthma.  She is also enrolled in lung cancer screening program.  Return in about 3 months (around 12/30/2024).  I spent 48 minutes caring for this patient today, including preparing to see the patient, obtaining a medical history , reviewing a separately obtained history,  performing a medically appropriate examination and/or evaluation, counseling and educating the patient/family/caregiver, ordering medications, tests, or procedures, documenting clinical information in the electronic health record, and independently interpreting results (not separately reported/billed) and communicating results to the patient/family/caregiver. 3 minutes utilized during today's visit to counsel the patient regarding the importance and strategies of smoking cessation.  Toni November, MD [facility]   End of visit medications:  Meds ordered this encounter  Medications   Tiotropium Bromide Monohydrate (SPIRIVA RESPIMAT) 2.5 MCG/ACT AERS    Sig: Inhale 2 puffs into the lungs daily.    Dispense:  60 each    Refill:  12     Current Outpatient Medications:    albuterol  (PROVENTIL ) (2.5 MG/3ML) 0.083% nebulizer solution, Take 3 mLs (2.5 mg total) by nebulization every 6 (six) hours as needed for wheezing or shortness of breath., Disp: 75 mL, Rfl: 12   albuterol  (VENTOLIN  HFA) 108 (90 Base) MCG/ACT inhaler, Inhale 1-2 puffs into the lungs every 6 (six) hours as needed for wheezing or shortness of breath., Disp: 3 each, Rfl: 3   amLODipine  (NORVASC ) 5 MG tablet, TAKE 1 TABLET (5 MG TOTAL) BY MOUTH DAILY., Disp: 90 tablet, Rfl: 3   budesonide  (RHINOCORT  AQUA) 32 MCG/ACT nasal spray, EACH NARE, Disp: 8.43 mL, Rfl: 0   busPIRone  (BUSPAR ) 5 MG tablet, Take 1 tablet (5 mg total) by mouth 2 (two) times daily., Disp: 180 tablet, Rfl: 2   Cholecalciferol (VITAMIN D ) 50 MCG (2000 UT) CAPS, Take 1 capsule (2,000 Units total) by mouth daily., Disp: 30  capsule, Rfl:    ezetimibe  (ZETIA ) 10 MG tablet, TAKE 1 TABLET BY MOUTH EVERY DAY, Disp: 90 tablet, Rfl: 1   fluticasone  furoate-vilanterol (BREO ELLIPTA ) 100-25 MCG/ACT AEPB, Inhale 1 puff into the lungs daily., Disp: 1 each, Rfl: 11   Propylene Glycol 0.6 % SOLN, Place 2 drops into both eyes daily., Disp: , Rfl:    Tiotropium  Bromide Monohydrate (SPIRIVA RESPIMAT) 2.5 MCG/ACT AERS, Inhale 2 puffs into the lungs daily., Disp: 60 each, Rfl: 12   alendronate  (FOSAMAX ) 70 MG tablet, Take 1 tablet (70 mg total) by mouth every 7 (seven) days. Take with a full glass of water  on an empty stomach. (Patient not taking: Reported on 09/05/2024), Disp: 4 tablet, Rfl: 11   promethazine-dextromethorphan (PROMETHAZINE-DM) 6.25-15 MG/5ML syrup, Take 5 mLs by mouth every 6 (six) hours as needed for cough., Disp: , Rfl:    Subjective:   PATIENT ID: Toni E Pen GENDER: female DOB: Dec 21, 1961, MRN: [id number]  Chief Complaint  Patient presents with   COPD    Former Toni Mcdonald pt, COPD/Asthma, smoker, acid reflux, CTA/X-ray: 08/30/24, CT: 06/21/24, Spiro: 02/01/24  Has been using her Breo inhaler regularly which she finds great relief from, and no longer using the albuterol  inhaler. No SOB/wheezing, and very occasional dry cough, her allergies are flared up a bit so some post nasal drip. No other complaints.    HPI  Patient is a pleasant 63 year old female with a past medical history of potential COPD/asthma overlap previously seen by Dr. [name] in our clinic in 2020.  She comes in to reestablish care after recent ED visit early in September where she was treated with antibiotics and steroids.  Patient reports feeling well over the past 5 years without much in the way of respiratory symptoms. When asked further, she does recall having 1 or 2 episodes a year of bronchitis treated with antibiotics and steroids.  Her respiratory symptoms had been stable up until September when she developed increased shortness of breath, increased cough, and wheezing.  She was seen in urgent care and prescribed antibiotics and steroids without much improvement prompting an ED visit and treated again with similar.    Following this, she was seen by her PCP and reinitiated on Breo Ellipta .  She has felt much better since restarting the Breo with near total  resolution of her symptoms. She is currently not needing to use her albuterol  inhaler.  The cough and wheeze have subsided.  Patient reports longstanding history of smoking, and smokes up to 1 pack a day.  She has at least 35 pack years of smoking history.  She has 2 dogs at home.  Ancillary information including prior medications, full medical/surgical/family/social histories, and PFTs (when available) are listed below and have been reviewed.    Review of Systems  Constitutional:  Negative for chills, fever and weight loss.  Respiratory:  Negative for cough, hemoptysis, sputum production, shortness of breath and wheezing.   Cardiovascular:  Negative for chest pain.     Objective:   Vitals:   09/29/24 0830  BP: 110/80  Pulse: 91  Temp: 98.6 F (37 C)  SpO2: 97%  Weight: 118 lb 9.6 oz (53.8 kg)  Height: 5' (1.524 m)   97% on RA  BMI Readings from Last 3 Encounters:  09/29/24 23.16 kg/m  09/05/24 23.28 kg/m  08/30/24 23.05 kg/m   Wt Readings from Last 3 Encounters:  09/29/24 118 lb 9.6 oz (53.8 kg)  09/05/24 119 lb 3.2 oz (54.1 kg)  08/30/24 118 lb (53.5 kg)    Physical Exam Constitutional:      Appearance: Normal appearance. She is not ill-appearing.  HENT:     Head: Normocephalic and atraumatic.     Mouth/Throat:     Mouth: Mucous membranes are moist.  Cardiovascular:     Rate and Rhythm: Normal rate and regular rhythm.     Pulses: Normal pulses.     Heart sounds: Normal heart sounds.  Pulmonary:     Effort: Pulmonary effort is normal.     Breath sounds: Normal breath sounds.  Abdominal:     Palpations: Abdomen is soft.     Tenderness: There is no abdominal tenderness.  Musculoskeletal:     Right lower leg: No edema.     Left lower leg: No edema.  Neurological:     General: No focal deficit present.     Mental Status: She is alert and oriented to person, place, and time. Mental status is at baseline.       Ancillary Information    Past Medical  History:  Diagnosis Date   Allergy    Anxiety    Arthritis    Asthma    [employer] 02/18/2021   Bronchitis    hx of   Chronic neck pain    GERD (gastroesophageal reflux disease)    Headache(784.0)    migraines   Hypertension    controlled by diet and exercise   Pneumonia    hx of     Family History  Problem Relation Age of Onset   Arthritis Mother    Heart disease Mother    Hyperlipidemia Mother    Hypertension Mother    Stroke Mother    Heart disease Father    Hyperlipidemia Father    Hypertension Father    Kidney disease Father    Stroke Father    Hyperthyroidism Sister    Hypothyroidism Sister    Uterine cancer Sister    Thyroid  disease Sister    Early death Brother    Heart disease Brother 88       Died at 61 with MI   Heart attack Brother    Vision loss Maternal Grandfather      Past Surgical History:  Procedure Laterality Date   ANTERIOR CERVICAL DECOMP/DISCECTOMY FUSION  09/22/2012   Procedure: ANTERIOR CERVICAL DECOMPRESSION/DISCECTOMY FUSION 3 LEVELS;  Surgeon: Alm GORMAN Molt, MD;  Location: [facility];  Service: Neurosurgery;  Laterality: N/A;  Anterior Cervical Diskectomy/Fusion with Plate, Cervical four through seven   CARDIOVASCULAR STRESS TEST     2008   COLONOSCOPY WITH PROPOFOL  N/A 07/10/2016   Procedure: COLONOSCOPY WITH PROPOFOL ;  Surgeon: Rogelia Copping, MD;  Location: [facility];  Service: Endoscopy;  Laterality: N/A;   DIAGNOSTIC LAPAROSCOPY     exploratory surgery due to scarring   SPINE SURGERY  Sept 25, 2013   Fusion and plating neck   TOTAL ABDOMINAL HYSTERECTOMY     still with ovaries   TUBAL LIGATION      Social History   Socioeconomic History   Marital status: Married    Spouse name: Not on file   Number of children: 2   Years of education: Not on file   Highest education level: Not on file  Occupational History    Employer: [employer]  Tobacco Use   Smoking status: Every Day    Current packs/day: 1.00     Average packs/day: 1 pack/day for 35.0 years (35.0 ttl pk-yrs)  Types: Cigarettes   Smokeless tobacco: Never   Tobacco comments:    0.5 pack per day 09.08.2025  Vaping Use   Vaping status: Not on file  Substance and Sexual Activity   Alcohol use: Yes    Alcohol/week: 4.0 standard drinks of alcohol    Types: 4 Glasses of wine per week    Comment: occasional   Drug use: No   Sexual activity: Yes    Partners: Male    Birth control/protection: Post-menopausal  Other Topics Concern   Not on file  Social History Narrative   Married. 2 children/ two girls. 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, theatre/television/film at text[employer]--On her feet, walking all day at [employer] other exercise   Social Drivers of Health   Financial Resource Strain: Not on file  Food Insecurity: Not on file  Transportation Needs: Not on file  Physical Activity: Not on file  Stress: Not on file  Social Connections: Unknown (05/12/2022)   [facility]   Social Network    Social Network: Not on file  Intimate Partner Violence: Unknown (04/03/2022)   Received from [facility]   HITS    Physically Hurt: Not on file    Insult or Talk Down To: Not on file    Threaten Physical Harm: Not on file    Scream or Curse: Not on file     Allergies  Allergen Reactions   Penicillins Anaphylaxis    Whelps    Atorvastatin  Other (See Comments)    Mental status changes, myalgias   Azithromycin  Diarrhea    GI Upset    Erythromycin Other (See Comments)    Unknown    Percocet [Oxycodone-Acetaminophen ] Nausea And Vomiting   Statins Other (See Comments)    Mental status changes, myalgias   Sulfamethoxazole-Trimethoprim Other (See Comments)    Unknown    Clopidogrel  Hives and Itching   Codeine Itching and Swelling    Skin crawls'   Effexor [Venlafaxine] Other (See Comments)    Felt weird   Lexapro [Escitalopram] Other (See Comments)    Sweating    Neosporin [Neomycin-Bacitracin  Zn-Polymyx] Other (See Comments)     Blisters the skin   Paxil [Paroxetine] Other (See Comments)    Weight gain    Wellbutrin [Bupropion] Hives     CBC    Component Value Date/Time   WBC 8.8 08/30/2024 1000   RBC 5.14 (H) 08/30/2024 1000   HGB 14.9 08/30/2024 1000   HCT 45.0 08/30/2024 1000   PLT 398 08/30/2024 1000   MCV 87.5 08/30/2024 1000   MCH 29.0 08/30/2024 1000   MCHC 33.1 08/30/2024 1000   RDW 13.1 08/30/2024 1000   LYMPHSABS 2.4 08/30/2024 1000   MONOABS 0.7 08/30/2024 1000   EOSABS 0.7 (H) 08/30/2024 1000   BASOSABS 0.2 (H) 08/30/2024 1000    Pulmonary Functions Testing Results:     No data to display          Outpatient Medications Prior to Visit  Medication Sig Dispense Refill   albuterol  (PROVENTIL ) (2.5 MG/3ML) 0.083% nebulizer solution Take 3 mLs (2.5 mg total) by nebulization every 6 (six) hours as needed for wheezing or shortness of breath. 75 mL 12   albuterol  (VENTOLIN  HFA) 108 (90 Base) MCG/ACT inhaler Inhale 1-2 puffs into the lungs every 6 (six) hours as needed for wheezing or shortness of breath. 3 each 3   amLODipine  (NORVASC ) 5 MG tablet TAKE 1 TABLET (5 MG TOTAL) BY MOUTH DAILY. 90 tablet 3   budesonide  (RHINOCORT  AQUA)  32 MCG/ACT nasal spray EACH NARE 8.43 mL 0   busPIRone  (BUSPAR ) 5 MG tablet Take 1 tablet (5 mg total) by mouth 2 (two) times daily. 180 tablet 2   Cholecalciferol (VITAMIN D ) 50 MCG (2000 UT) CAPS Take 1 capsule (2,000 Units total) by mouth daily. 30 capsule    ezetimibe  (ZETIA ) 10 MG tablet TAKE 1 TABLET BY MOUTH EVERY DAY 90 tablet 1   fluticasone  furoate-vilanterol (BREO ELLIPTA ) 100-25 MCG/ACT AEPB Inhale 1 puff into the lungs daily. 1 each 11   Propylene Glycol 0.6 % SOLN Place 2 drops into both eyes daily.     alendronate  (FOSAMAX ) 70 MG tablet Take 1 tablet (70 mg total) by mouth every 7 (seven) days. Take with a full glass of water  on an empty stomach. (Patient not taking: Reported on 09/05/2024) 4 tablet 11   promethazine-dextromethorphan (PROMETHAZINE-DM)  6.25-15 MG/5ML syrup Take 5 mLs by mouth every 6 (six) hours as needed for cough.     No facility-administered medications prior to visit.

## 2024-10-02 LAB — ALLERGEN PANEL (27) + IGE
Alternaria Alternata IgE: <0.1 kU/L
Aspergillus Fumigatus IgE: <0.1 kU/L
Bahia Grass IgE: <0.1 kU/L
Bermuda Grass IgE: <0.1 kU/L
Cat Dander IgE: <0.1 kU/L
Cedar, Mountain IgE: <0.1 kU/L
Cladosporium Herbarum IgE: <0.1 kU/L
Cocklebur IgE: <0.1 kU/L
Cockroach, American IgE: <0.1 kU/L
Common Silver Birch IgE: <0.1 kU/L
D Farinae IgE: <0.1 kU/L
D Pteronyssinus IgE: <0.1 kU/L
Dog Dander IgE: <0.1 kU/L
Elm, American IgE: <0.1 kU/L
Hickory, White IgE: <0.1 kU/L
IgE (Immunoglobulin E), Serum: 88 [IU]/mL (ref 6–495)
Johnson Grass IgE: <0.1 kU/L
Kentucky Bluegrass IgE: <0.1 kU/L
Maple/Box Elder IgE: <0.1 kU/L
Mucor Racemosus IgE: <0.1 kU/L
Oak, White IgE: <0.1 kU/L
Penicillium Chrysogen IgE: <0.1 kU/L
Pigweed, Rough IgE: <0.1 kU/L
Plantain, English IgE: <0.1 kU/L
Ragweed, Short IgE: <0.1 kU/L
Setomelanomma Rostrat: <0.1 kU/L
Timothy Grass IgE: <0.1 kU/L
White Mulberry IgE: <0.1 kU/L

## 2024-10-24 ENCOUNTER — Ambulatory Visit: Payer: Self-pay | Admitting: Student in an Organized Health Care Education/Training Program

## 2024-10-31 ENCOUNTER — Ambulatory Visit (INDEPENDENT_AMBULATORY_CARE_PROVIDER_SITE_OTHER)

## 2024-10-31 DIAGNOSIS — J42 Unspecified chronic bronchitis: Secondary | ICD-10-CM | POA: Diagnosis not present

## 2024-10-31 DIAGNOSIS — J4540 Moderate persistent asthma, uncomplicated: Secondary | ICD-10-CM

## 2024-10-31 LAB — PULMONARY FUNCTION TEST
DL/VA % pred: 65 %
DL/VA: 2.85 ml/min/mmHg/L
DLCO unc % pred: 59 %
DLCO unc: 10.41 ml/min/mmHg
FEF 25-75 Post: 1.22 L/s
FEF 25-75 Pre: 1.11 L/s
FEF2575-%Change-Post: 9 %
FEF2575-%Pred-Post: 58 %
FEF2575-%Pred-Pre: 53 %
FEV1-%Change-Post: 1 %
FEV1-%Pred-Post: 80 %
FEV1-%Pred-Pre: 79 %
FEV1-Post: 1.74 L
FEV1-Pre: 1.71 L
FEV1FVC-%Change-Post: 0 %
FEV1FVC-%Pred-Pre: 93 %
FEV6-%Change-Post: 2 %
FEV6-%Pred-Post: 88 %
FEV6-%Pred-Pre: 86 %
FEV6-Post: 2.38 L
FEV6-Pre: 2.33 L
FEV6FVC-%Change-Post: 0 %
FEV6FVC-%Pred-Post: 103 %
FEV6FVC-%Pred-Pre: 102 %
FVC-%Change-Post: 1 %
FVC-%Pred-Post: 85 %
FVC-%Pred-Pre: 83 %
FVC-Post: 2.39 L
FVC-Pre: 2.35 L
Post FEV1/FVC ratio: 73 %
Post FEV6/FVC ratio: 100 %
Pre FEV1/FVC ratio: 73 %
Pre FEV6/FVC Ratio: 99 %
RV % pred: 86 %
RV: 1.56 L
TLC % pred: 89 %
TLC: 3.97 L

## 2024-10-31 NOTE — Progress Notes (Signed)
 Full PFT completed today ? ?

## 2024-10-31 NOTE — Patient Instructions (Signed)
 Full PFT completed today ? ?

## 2025-01-05 ENCOUNTER — Ambulatory Visit: Admitting: Student in an Organized Health Care Education/Training Program

## 2025-01-05 ENCOUNTER — Encounter: Payer: Self-pay | Admitting: Student in an Organized Health Care Education/Training Program

## 2025-01-05 VITALS — BP 100/66 | HR 98 | Temp 97.8°F | Ht 60.0 in | Wt 119.0 lb

## 2025-01-05 DIAGNOSIS — R942 Abnormal results of pulmonary function studies: Secondary | ICD-10-CM | POA: Diagnosis not present

## 2025-01-05 DIAGNOSIS — J4540 Moderate persistent asthma, uncomplicated: Secondary | ICD-10-CM

## 2025-01-05 DIAGNOSIS — J4520 Mild intermittent asthma, uncomplicated: Secondary | ICD-10-CM

## 2025-01-05 DIAGNOSIS — F17210 Nicotine dependence, cigarettes, uncomplicated: Secondary | ICD-10-CM

## 2025-01-05 DIAGNOSIS — J439 Emphysema, unspecified: Secondary | ICD-10-CM

## 2025-01-05 DIAGNOSIS — J42 Unspecified chronic bronchitis: Secondary | ICD-10-CM

## 2025-01-05 MED ORDER — TRELEGY ELLIPTA 100-62.5-25 MCG/ACT IN AEPB: 1.0000 | INHALATION_SPRAY | Freq: Every day | RESPIRATORY_TRACT | 12 refills | Status: AC

## 2025-01-05 NOTE — Patient Instructions (Signed)
" °  VISIT SUMMARY:  You came in today for a follow-up visit regarding your emphysema and asthma. We discussed your current medications and the side effects you have been experiencing with Spiriva . Based on your feedback and recent pulmonary function tests, we have made some changes to your treatment plan.  YOUR PLAN:  -MODERATE PERSISTENT ASTHMA: Asthma is a condition where your airways become inflamed and narrow, making it hard to breathe. Your asthma has been well-controlled with Breo Ellipta , but you experienced side effects with Spiriva . We are switching you from Breo Ellipta  to Trelegy Ellipta , which you should take one puff daily in the morning. This change is expected to help manage your asthma without causing the side effects you experienced with Spiriva .  -CHRONIC OBSTRUCTIVE PULMONARY DISEASE WITH EMPHYSEMA: Emphysema is a type of chronic lung disease that causes shortness of breath due to damage to the air sacs in the lungs. Your pulmonary function tests show an obstructive defect and reduced lung capacity, consistent with emphysema. We are switching you to Trelegy Ellipta , which is expected to help manage both your asthma and COPD symptoms more effectively than Spiriva .  INSTRUCTIONS:  Please start taking Trelegy Ellipta , one puff daily in the morning, and discontinue the use of Breo Ellipta  and Spiriva . Follow up with us  if you experience any new or worsening symptoms. We will monitor your progress and adjust your treatment plan as needed.   Contains text generated by [employer].   "

## 2025-01-05 NOTE — Progress Notes (Signed)
 " Assessment & Plan  #PRISM  (preserved ratio/impaired spirometry) #Emphysema #Mild intermittent Asthma  Presents for follow up on respiratory symptoms in the setting of PFT's from 2020 showing scooping of flow volume loops. Repeat PFT's show mild obstruction with likely preserved ratio/impaired spirometry. Lung volumes are normal, and DLCO is moderately reduced. Eosinophil count historically elevated at 700 in 2025.  She was started on Breo previously, with improvement in symptoms, suggesting a component of reversible airway disease. She has some persistent obstruction on PFT's, and the pattern is consistent with PRISM , especially that imaging showed emphysema. Added LAMA to LABA/ICS but she is unable to tolerate the Spiriva .  Will attempt to switch regimen and consolidate inhalers with Trelegy.  - Fluticasone -Umeclidin-Vilant (TRELEGY ELLIPTA ) 100-62.5-25 MCG/ACT AEPB; Inhale 1 puff into the lungs daily.  Dispense: 30 each; Refill: 12    Return in about 3 months (around 04/05/2025).  Belva November, MD [facility]  I spent 30 minutes caring for this patient today, including preparing to see the patient, obtaining a medical history , reviewing a separately obtained history, performing a medically appropriate examination and/or evaluation, counseling and educating the patient/family/caregiver, ordering medications, tests, or procedures, documenting clinical information in the electronic health record, and independently interpreting results (not separately reported/billed) and communicating results to the patient/family/caregiver  End of visit medications:  Meds ordered this encounter  Medications   Fluticasone -Umeclidin-Vilant (TRELEGY ELLIPTA ) 100-62.5-25 MCG/ACT AEPB    Sig: Inhale 1 puff into the lungs daily.    Dispense:  30 each    Refill:  12    Current Medications[1]   Subjective:   PATIENT ID: Toni Mcdonald GENDER: female DOB: 04-08-1961, MRN:  [id number]  Chief Complaint  Patient presents with   Asthma    DOE. No wheezing. Cough with occasional yellowish sputum. Spiriva - 2 puffs daily, makes her feel like she can not breath and causes sores in her mouth. Breo-daily, helps with her breathing. Albuterol - PRN. Albuterol  Neb- PRN    HPI  Discussed the use of AI scribe software for clinical note transcription with the patient, who gave verbal consent to proceed.  History of Present Illness  Toni Mcdonald is a 64 year old female with emphysema who presents for follow-up.  Initial Visit 09/29/2024:  Patient reports feeling well over the past 5 years without much in the way of respiratory symptoms. When asked further, she does recall having 1 or 2 episodes a year of bronchitis treated with antibiotics and steroids.  Her respiratory symptoms had been stable up until September when she developed increased shortness of breath, increased cough, and wheezing.  She was seen in urgent care and prescribed antibiotics and steroids without much improvement prompting an ED visit and treated again with similar.     Following this, she was seen by her PCP and reinitiated on Breo Ellipta .  She has felt much better since restarting the Breo with near total resolution of her symptoms. She is currently not needing to use her albuterol  inhaler.  The cough and wheeze have subsided.  Return Visit 01/05/2025:  She was previously on Breo, to which Spiriva  was added. However, she did not feel better with Spiriva  and experienced adverse effects including sores in her mouth, tongue, and throat for the first three weeks of use. She describes a sensation of burning in her bronchioles. Despite these issues, she recently picked up another three-month supply of Spiriva .  Breo has been effective for her and has not caused any irritation. She  has not tried Trelegy before. Her current medication regimen includes Breo, which she takes at 6:30 PM, and Spiriva , which  she takes at 4:00 AM.  Pulmonary function tests (PFTs) performed in November showed an obstructive defect. Her FEV1/FVC ratio was 73% before and after bronchodilator. Her lung volumes were normal, but DLCO was reduced. The PFTs from 2020 showed similar results, with mild reduction over time.   Patient reports longstanding history of smoking, and smokes up to 1 pack a day.  She has at least 35 pack years of smoking history.  She has 2 dogs at home.   Ancillary information including prior medications, full medical/surgical/family/social histories, and PFTs (when available) are listed below and have been reviewed.    Review of Systems  Constitutional:  Negative for chills and fever.  Respiratory:  Positive for shortness of breath. Negative for cough, hemoptysis, sputum production and wheezing.   Cardiovascular:  Negative for chest pain.     Objective:   Vitals:   01/05/25 1437  BP: 100/66  Pulse: 98  Temp: 97.8 F (36.6 C)  SpO2: 97%  Weight: 119 lb (54 kg)  Height: 5' (1.524 m)   97% on RA BMI Readings from Last 3 Encounters:  01/05/25 23.24 kg/m  10/31/24 23.05 kg/m  09/29/24 23.16 kg/m   Wt Readings from Last 3 Encounters:  01/05/25 119 lb (54 kg)  10/31/24 118 lb (53.5 kg)  09/29/24 118 lb 9.6 oz (53.8 kg)     Physical Exam Constitutional:      Appearance: Normal appearance.  Cardiovascular:     Rate and Rhythm: Normal rate and regular rhythm.     Pulses: Normal pulses.     Heart sounds: Normal heart sounds.  Pulmonary:     Effort: Pulmonary effort is normal.     Breath sounds: Normal breath sounds. No wheezing or rales.  Neurological:     General: No focal deficit present.     Mental Status: She is alert and oriented to person, place, and time. Mental status is at baseline.     Ancillary Information    Past Medical History:  Diagnosis Date   Allergy    Anxiety    Arthritis    Asthma    Phreesia 02/18/2021   Bronchitis    hx of   Chronic neck  pain    GERD (gastroesophageal reflux disease)    Headache(784.0)    migraines   Hypertension    controlled by diet and exercise   Pneumonia    hx of     Family History  Problem Relation Age of Onset   Arthritis Mother    Heart disease Mother    Hyperlipidemia Mother    Hypertension Mother    Stroke Mother    Heart disease Father    Hyperlipidemia Father    Hypertension Father    Kidney disease Father    Stroke Father    Hyperthyroidism Sister    Hypothyroidism Sister    Uterine cancer Sister    Thyroid  disease Sister    Early death Brother    Heart disease Brother 45       Died at 8 with MI   Heart attack Brother    Vision loss Maternal Grandfather      Past Surgical History:  Procedure Laterality Date   ANTERIOR CERVICAL DECOMP/DISCECTOMY FUSION  09/22/2012   Procedure: ANTERIOR CERVICAL DECOMPRESSION/DISCECTOMY FUSION 3 LEVELS;  Surgeon: Alm GORMAN Molt, MD;  Location: [facility];  Service: Neurosurgery;  Laterality: N/A;  Anterior Cervical Diskectomy/Fusion with Plate, Cervical four through seven   CARDIOVASCULAR STRESS TEST     2008   COLONOSCOPY WITH PROPOFOL  N/A 07/10/2016   Procedure: COLONOSCOPY WITH PROPOFOL ;  Surgeon: Rogelia Copping, MD;  Location: [facility];  Service: Endoscopy;  Laterality: N/A;   DIAGNOSTIC LAPAROSCOPY     exploratory surgery due to scarring   SPINE SURGERY  Sept 25, 2013   Fusion and plating neck   TOTAL ABDOMINAL HYSTERECTOMY     still with ovaries   TUBAL LIGATION      Social History   Socioeconomic History   Marital status: Married    Spouse name: Not on file   Number of children: 2   Years of education: Not on file   Highest education level: Not on file  Occupational History    Employer: [employer]  Tobacco Use   Smoking status: Every Day    Current packs/day: 1.00    Average packs/day: 1 pack/day for 35.0 years (35.0 ttl pk-yrs)    Types: Cigarettes   Smokeless tobacco: Never   Tobacco comments:     1 PPD - khj 01/05/2025  Vaping Use   Vaping status: Not on file  Substance and Sexual Activity   Alcohol use: Yes    Alcohol/week: 4.0 standard drinks of alcohol    Types: 4 Glasses of wine per week    Comment: occasional   Drug use: No   Sexual activity: Yes    Partners: Male    Birth control/protection: Post-menopausal  Other Topics Concern   Not on file  Social History Narrative   Married. 2 children/ two girls. 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, theatre/television/film at text[employer]--On her feet, walking all day at [employer] other exercise   Social Drivers of Health   Tobacco Use: High Risk (01/05/2025)   Patient History    Smoking Tobacco Use: Every Day    Smokeless Tobacco Use: Never    Passive Exposure: Not on file  Financial Resource Strain: Not on file  Food Insecurity: Not on file  Transportation Needs: Not on file  Physical Activity: Not on file  Stress: Not on file  Social Connections: Unknown (05/12/2022)   Received from [facility]   Social Network    Social Network: Not on file  Intimate Partner Violence: Unknown (04/03/2022)   Received from [facility]   HITS    Physically Hurt: Not on file    Insult or Talk Down To: Not on file    Threaten Physical Harm: Not on file    Scream or Curse: Not on file  Depression (PHQ2-9): Low Risk (05/06/2024)   Depression (PHQ2-9)    PHQ-2 Score: 2  Alcohol Screen: Not on file  Housing: Unknown (08/10/2024)   Received from [facility]   Epic    Unable to Pay for Housing in the Last Year: Not on file    Number of Times Moved in the Last Year: Not on file    At any time in the past 12 months, were you homeless or living in a shelter (including now)?: No  Utilities: Not on file  Health Literacy: Not on file     Allergies[2]   CBC    Component Value Date/Time   WBC 8.8 08/30/2024 1000   RBC 5.14 (H) 08/30/2024 1000   HGB 14.9 08/30/2024 1000   HCT 45.0 08/30/2024 1000   PLT 398 08/30/2024 1000   MCV 87.5 08/30/2024 1000    MCH  29.0 08/30/2024 1000   MCHC 33.1 08/30/2024 1000   RDW 13.1 08/30/2024 1000   LYMPHSABS 2.4 08/30/2024 1000   MONOABS 0.7 08/30/2024 1000   EOSABS 0.7 (H) 08/30/2024 1000   BASOSABS 0.2 (H) 08/30/2024 1000    Pulmonary Functions Testing Results:    Latest Ref Rng & Units 10/31/2024    8:33 AM  PFT Results  FVC-Pre L 2.35   FVC-Predicted Pre % 83   FVC-Post L 2.39   FVC-Predicted Post % 85   Pre FEV1/FVC % % 73   Post FEV1/FCV % % 73   FEV1-Pre L 1.71   FEV1-Predicted Pre % 79   FEV1-Post L 1.74   DLCO uncorrected ml/min/mmHg 10.41   DLCO UNC% % 59   DLVA Predicted % 65   TLC L 3.97   TLC % Predicted % 89   RV % Predicted % 86     Outpatient Medications Prior to Visit  Medication Sig Dispense Refill   albuterol  (PROVENTIL ) (2.5 MG/3ML) 0.083% nebulizer solution Take 3 mLs (2.5 mg total) by nebulization every 6 (six) hours as needed for wheezing or shortness of breath. 75 mL 12   albuterol  (VENTOLIN  HFA) 108 (90 Base) MCG/ACT inhaler Inhale 1-2 puffs into the lungs every 6 (six) hours as needed for wheezing or shortness of breath. 3 each 3   amLODipine  (NORVASC ) 5 MG tablet TAKE 1 TABLET (5 MG TOTAL) BY MOUTH DAILY. 90 tablet 3   budesonide  (RHINOCORT  AQUA) 32 MCG/ACT nasal spray EACH NARE 8.43 mL 0   busPIRone  (BUSPAR ) 5 MG tablet Take 1 tablet (5 mg total) by mouth 2 (two) times daily. 180 tablet 2   Cholecalciferol (VITAMIN D ) 50 MCG (2000 UT) CAPS Take 1 capsule (2,000 Units total) by mouth daily. 30 capsule    ezetimibe  (ZETIA ) 10 MG tablet TAKE 1 TABLET BY MOUTH EVERY DAY 90 tablet 1   Propylene Glycol 0.6 % SOLN Place 2 drops into both eyes daily.     alendronate  (FOSAMAX ) 70 MG tablet Take 1 tablet (70 mg total) by mouth every 7 (seven) days. Take with a full glass of water  on an empty stomach. (Patient not taking: Reported on 09/05/2024) 4 tablet 11   fluticasone  furoate-vilanterol (BREO ELLIPTA ) 100-25 MCG/ACT AEPB Inhale 1 puff into the lungs daily. 1 each 11    promethazine-dextromethorphan (PROMETHAZINE-DM) 6.25-15 MG/5ML syrup Take 5 mLs by mouth every 6 (six) hours as needed for cough.     Tiotropium Bromide Monohydrate  (SPIRIVA  RESPIMAT) 2.5 MCG/ACT AERS Inhale 2 puffs into the lungs daily. 60 each 12   No facility-administered medications prior to visit.      [1]  Current Outpatient Medications:    Fluticasone -Umeclidin-Vilant (TRELEGY ELLIPTA ) 100-62.5-25 MCG/ACT AEPB, Inhale 1 puff into the lungs daily., Disp: 30 each, Rfl: 12   albuterol  (PROVENTIL ) (2.5 MG/3ML) 0.083% nebulizer solution, Take 3 mLs (2.5 mg total) by nebulization every 6 (six) hours as needed for wheezing or shortness of breath., Disp: 75 mL, Rfl: 12   albuterol  (VENTOLIN  HFA) 108 (90 Base) MCG/ACT inhaler, Inhale 1-2 puffs into the lungs every 6 (six) hours as needed for wheezing or shortness of breath., Disp: 3 each, Rfl: 3   amLODipine  (NORVASC ) 5 MG tablet, TAKE 1 TABLET (5 MG TOTAL) BY MOUTH DAILY., Disp: 90 tablet, Rfl: 3   budesonide  (RHINOCORT  AQUA) 32 MCG/ACT nasal spray, EACH NARE, Disp: 8.43 mL, Rfl: 0   busPIRone  (BUSPAR ) 5 MG tablet, Take 1 tablet (5 mg total) by mouth 2 (  two) times daily., Disp: 180 tablet, Rfl: 2   Cholecalciferol (VITAMIN D ) 50 MCG (2000 UT) CAPS, Take 1 capsule (2,000 Units total) by mouth daily., Disp: 30 capsule, Rfl:    ezetimibe  (ZETIA ) 10 MG tablet, TAKE 1 TABLET BY MOUTH EVERY DAY, Disp: 90 tablet, Rfl: 1   Propylene Glycol 0.6 % SOLN, Place 2 drops into both eyes daily., Disp: , Rfl:  [2]  Allergies Allergen Reactions   Penicillins Anaphylaxis    Whelps    Atorvastatin  Other (See Comments)    Mental status changes, myalgias   Azithromycin  Diarrhea    GI Upset    Erythromycin Other (See Comments)    Unknown    Percocet [Oxycodone-Acetaminophen ] Nausea And Vomiting   Statins Other (See Comments)    Mental status changes, myalgias   Sulfamethoxazole-Trimethoprim Other (See Comments)    Unknown    Clopidogrel  Hives and  Itching   Codeine Itching and Swelling    Skin crawls'   Effexor [Venlafaxine] Other (See Comments)    Felt weird   Lexapro [Escitalopram] Other (See Comments)    Sweating    Neosporin [Neomycin-Bacitracin  Zn-Polymyx] Other (See Comments)    Blisters the skin   Paxil [Paroxetine] Other (See Comments)    Weight gain    Wellbutrin [Bupropion] Hives   "

## 2025-01-07 ENCOUNTER — Ambulatory Visit
Admission: EM | Admit: 2025-01-07 | Discharge: 2025-01-07 | Disposition: A | Discharge status: Home / Self Care | Attending: Family Medicine | Admitting: Family Medicine

## 2025-01-07 DIAGNOSIS — B370 Candidal stomatitis: Secondary | ICD-10-CM

## 2025-01-07 MED ORDER — NYSTATIN 100000 UNIT/ML MT SUSP: 500000.0000 [IU] | Freq: Four times a day (QID) | OROMUCOSAL | 0 refills | Status: AC

## 2025-01-07 MED ORDER — FLUCONAZOLE 150 MG PO TABS: 150.0000 mg | ORAL_TABLET | Freq: Every day | ORAL | 0 refills | Status: AC

## 2025-01-07 NOTE — ED Provider Notes (Signed)
 " [facility]    CSN: [id number] Arrival date & time: 01/07/25  1505      History   Chief Complaint Chief Complaint  Patient presents with   Thrush    HPI Toni Mcdonald is a 64 y.o. female.   Patient presenting today with oral pain and discharge for the past few days after switching to trelegy inhaler with pulmonology. Had been having mouth sores from spiriva  for the weeks prior. Denies fever, difficulty breathing or swallowing, abdominal pain, N/V/D. So far trying home remedies and rinses with no relief.     Past Medical History:  Diagnosis Date   Allergy    Anxiety    Arthritis    Asthma    [employer] 02/18/2021   Bronchitis    hx of   Chronic neck pain    GERD (gastroesophageal reflux disease)    Headache(784.0)    migraines   Hypertension    controlled by diet and exercise   Pneumonia    hx of    Patient Active Problem List   Diagnosis Date Noted   Pulmonary emphysema (HCC) 09/05/2024   History of smoking greater than 50 pack years 05/06/2024   Anxiety state 09/09/2023   Postmenopausal 04/10/2023   Allergic rhinitis 10/02/2022   Elevated liver function tests 04/07/2022   Vitiligo 04/02/2022   Mixed hyperlipidemia 04/02/2022   Localized osteoporosis without current pathological fracture 04/02/2022   Vitamin B12 deficiency 04/02/2022   Internal hemorrhoid 04/02/2022   Statin myopathy 04/02/2022   Statin intolerance 04/02/2022   Carotid arterial disease 07/25/2019   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   Perennial allergic rhinitis 01/31/2019   Laryngopharyngeal reflux (LPR) 01/31/2019   Tobacco dependence due to cigarettes 01/31/2019   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   Essential hypertension, benign 07/27/2014   Vitamin D  deficiency 07/27/2014    Past Surgical History:  Procedure Laterality Date   ANTERIOR CERVICAL DECOMP/DISCECTOMY FUSION  09/22/2012   Procedure: ANTERIOR CERVICAL DECOMPRESSION/DISCECTOMY FUSION 3 LEVELS;  Surgeon: Alm GORMAN Molt, MD;  Location: [facility];  Service: Neurosurgery;  Laterality: N/A;  Anterior Cervical Diskectomy/Fusion with Plate, Cervical four through seven   CARDIOVASCULAR STRESS TEST     2008   COLONOSCOPY WITH PROPOFOL  N/A 07/10/2016   Procedure: COLONOSCOPY WITH PROPOFOL ;  Surgeon: Rogelia Copping, MD;  Location: [facility];  Service: Endoscopy;  Laterality: N/A;   DIAGNOSTIC LAPAROSCOPY     exploratory surgery due to scarring   SPINE SURGERY  Sept 25, 2013   Fusion and plating neck   TOTAL ABDOMINAL HYSTERECTOMY     still with ovaries   TUBAL LIGATION      OB History   No obstetric history on file.      Home Medications    Prior to Admission medications  Medication Sig Start Date End Date Taking? Authorizing Provider  fluconazole  (DIFLUCAN ) 150 MG tablet Take 1 tablet (150 mg total) by mouth daily. 01/07/25  Yes Stuart Vernell Norris, PA-C  nystatin  (MYCOSTATIN ) 100000 UNIT/ML suspension Take 5 mLs (500,000 Units total) by mouth 4 (four) times daily. 01/07/25  Yes Stuart Vernell Norris, PA-C  albuterol  (PROVENTIL ) (2.5 MG/3ML) 0.083% nebulizer solution Take 3 mLs (2.5 mg total) by nebulization every 6 (six) hours as needed for wheezing or shortness of breath. 08/30/24   Melvenia Motto, MD  albuterol  (VENTOLIN  HFA) 108 (90 Base) MCG/ACT inhaler Inhale 1-2 puffs into the lungs every 6 (six) hours as needed for wheezing or shortness of breath. 08/30/24  Melvenia Motto, MD  amLODipine  (NORVASC ) 5 MG tablet TAKE 1 TABLET (5 MG TOTAL) BY MOUTH DAILY. 06/13/24   Corwin Antu, FNP  budesonide  (RHINOCORT  AQUA) 32 MCG/ACT nasal spray EACH NARE 05/17/20   Bates, Crystal A, FNP  busPIRone  (BUSPAR ) 5 MG tablet Take 1 tablet (5 mg total) by mouth 2 (two) times daily. 06/22/24   Dugal, Tabitha, FNP  Cholecalciferol (VITAMIN D ) 50 MCG (2000 UT) CAPS Take 1 capsule (2,000 Units total) by mouth daily. 04/02/22   Dugal, Tabitha, FNP  ezetimibe  (ZETIA ) 10 MG tablet TAKE 1 TABLET BY MOUTH EVERY DAY 09/07/24    Corwin Antu, FNP  Fluticasone -Umeclidin-Vilant (TRELEGY ELLIPTA ) 100-62.5-25 MCG/ACT AEPB Inhale 1 puff into the lungs daily. 01/05/25   Isadora Hose, MD  Propylene Glycol 0.6 % SOLN Place 2 drops into both eyes daily.    [provider]    Family History Family History  Problem Relation Age of Onset   Arthritis Mother    Heart disease Mother    Hyperlipidemia Mother    Hypertension Mother    Stroke Mother    Heart disease Father    Hyperlipidemia Father    Hypertension Father    Kidney disease Father    Stroke Father    Hyperthyroidism Sister    Hypothyroidism Sister    Uterine cancer Sister    Thyroid  disease Sister    Early death Brother    Heart disease Brother 55       Died at 15 with MI   Heart attack Brother    Vision loss Maternal Grandfather     Social History Social History[1]   Allergies   Penicillins, Atorvastatin , Azithromycin , Erythromycin, Percocet [oxycodone-acetaminophen ], Statins, Sulfamethoxazole-trimethoprim, Clopidogrel , Codeine, Effexor [venlafaxine], Lexapro [escitalopram], Neosporin [neomycin-bacitracin  zn-polymyx], Paxil [paroxetine], and Wellbutrin [bupropion]   Review of Systems Review of Systems PER HPI  Physical Exam Triage Vital Signs ED Triage Vitals  Encounter Vitals Group     BP 01/07/25 1515 113/77     Girls Systolic BP Percentile --      Girls Diastolic BP Percentile --      Boys Systolic BP Percentile --      Boys Diastolic BP Percentile --      Pulse Rate 01/07/25 1515 (!) 102     Resp 01/07/25 1515 16     Temp 01/07/25 1515 98.3 F (36.8 C)     Temp Source 01/07/25 1515 Oral     SpO2 01/07/25 1515 96 %     Weight --      Height --      Head Circumference --      Peak Flow --      Pain Score 01/07/25 1512 10     Pain Loc --      Pain Education --      Exclude from Growth Chart --    No data found.  Updated Vital Signs BP 113/77 (BP Location: Right Arm)   Pulse (!) 102   Temp 98.3 F (36.8 C)  (Oral)   Resp 16   SpO2 96%   Visual Acuity Right Eye Distance:   Left Eye Distance:   Bilateral Distance:    Right Eye Near:   Left Eye Near:    Bilateral Near:     Physical Exam Vitals and nursing note reviewed.  Constitutional:      Appearance: Normal appearance. She is not ill-appearing.  HENT:     Head: Atraumatic.     Nose: Nose normal.  Mouth/Throat:     Mouth: Mucous membranes are moist.     Pharynx: Posterior oropharyngeal erythema present.     Comments: Exudates present to oropharynx and tongue Eyes:     Extraocular Movements: Extraocular movements intact.     Conjunctiva/sclera: Conjunctivae normal.  Cardiovascular:     Rate and Rhythm: Normal rate.  Pulmonary:     Effort: Pulmonary effort is normal.  Musculoskeletal:        General: Normal range of motion.     Cervical back: Normal range of motion and neck supple.  Skin:    General: Skin is warm and dry.  Neurological:     Mental Status: She is alert and oriented to person, place, and time.  Psychiatric:        Mood and Affect: Mood normal.        Thought Content: Thought content normal.        Judgment: Judgment normal.     UC Treatments / Results  Labs (all labs ordered are listed, but only abnormal results are displayed) Labs Reviewed - No data to display  EKG   Radiology No results found.  Procedures Procedures (including critical care time)  Medications Ordered in UC Medications - No data to display  Initial Impression / Assessment and Plan / UC Course  I have reviewed the triage vital signs and the nursing notes.  Pertinent labs & imaging results that were available during my care of the patient were reviewed by me and considered in my medical decision making (see chart for details).     Likely thrush secondary to inhaler use, treat with oral diflucan  and nystatin  rinse, probiotics, good rinsing after inhaler use. Return for worsening or unresolving sxs.  Final Clinical  Impressions(s) / UC Diagnoses   Final diagnoses:  Oral thrush   Discharge Instructions   None    ED Prescriptions     Medication Sig Dispense Auth. Provider   fluconazole  (DIFLUCAN ) 150 MG tablet Take 1 tablet (150 mg total) by mouth daily. 5 tablet Stuart Vernell Norris, PA-C   nystatin  (MYCOSTATIN ) 100000 UNIT/ML suspension Take 5 mLs (500,000 Units total) by mouth 4 (four) times daily. 120 mL Stuart Vernell Norris, [place]      PDMP not reviewed this encounter.    [1]  Social History Tobacco Use   Smoking status: Every Day    Current packs/day: 1.00    Average packs/day: 1 pack/day for 35.0 years (35.0 ttl pk-yrs)    Types: Cigarettes   Smokeless tobacco: Never   Tobacco comments:    1 PPD - khj 01/05/2025  Substance Use Topics   Alcohol use: Yes    Alcohol/week: 4.0 standard drinks of alcohol    Types: 4 Glasses of wine per week    Comment: occasional   Drug use: No     Stuart Vernell Norris, PA-C 01/07/25 1537  "

## 2025-01-07 NOTE — ED Triage Notes (Signed)
 Pt states she thinks she has thrush on her tongue.  States she thinks its from her using her inhaler.

## 2025-03-13 ENCOUNTER — Ambulatory Visit (INDEPENDENT_AMBULATORY_CARE_PROVIDER_SITE_OTHER): Admitting: Vascular Surgery

## 2025-03-13 ENCOUNTER — Encounter (INDEPENDENT_AMBULATORY_CARE_PROVIDER_SITE_OTHER)

## 2025-04-24 ENCOUNTER — Ambulatory Visit: Admitting: Student in an Organized Health Care Education/Training Program
